# Patient Record
Sex: Male | Born: 1965 | Race: Black or African American | Hispanic: No | Marital: Married | Smoking: Former smoker
Health system: Southern US, Community
[De-identification: ages and names within clinical notes are randomized; demographics above are authoritative.]

## PROBLEM LIST (undated history)

## (undated) DIAGNOSIS — E785 Hyperlipidemia, unspecified: Secondary | ICD-10-CM

## (undated) DIAGNOSIS — Z91148 Patient's other noncompliance with medication regimen for other reason: Secondary | ICD-10-CM

## (undated) DIAGNOSIS — Z720 Tobacco use: Secondary | ICD-10-CM

## (undated) DIAGNOSIS — N182 Chronic kidney disease, stage 2 (mild): Secondary | ICD-10-CM

## (undated) DIAGNOSIS — I517 Cardiomegaly: Secondary | ICD-10-CM

## (undated) DIAGNOSIS — R9431 Abnormal electrocardiogram [ECG] [EKG]: Secondary | ICD-10-CM

## (undated) DIAGNOSIS — Z9114 Patient's other noncompliance with medication regimen: Secondary | ICD-10-CM

## (undated) DIAGNOSIS — F319 Bipolar disorder, unspecified: Secondary | ICD-10-CM

## (undated) DIAGNOSIS — I639 Cerebral infarction, unspecified: Secondary | ICD-10-CM

## (undated) DIAGNOSIS — I10 Essential (primary) hypertension: Secondary | ICD-10-CM

## (undated) DIAGNOSIS — Z8249 Family history of ischemic heart disease and other diseases of the circulatory system: Secondary | ICD-10-CM

## (undated) HISTORY — DX: Tobacco use: Z72.0

## (undated) HISTORY — DX: Patient's other noncompliance with medication regimen for other reason: Z91.148

## (undated) HISTORY — DX: Family history of ischemic heart disease and other diseases of the circulatory system: Z82.49

## (undated) HISTORY — DX: Hyperlipidemia, unspecified: E78.5

## (undated) HISTORY — DX: Abnormal electrocardiogram (ECG) (EKG): R94.31

## (undated) HISTORY — PX: ANKLE FRACTURE SURGERY: SHX122

## (undated) HISTORY — DX: Patient's other noncompliance with medication regimen: Z91.14

---

## 1997-07-02 ENCOUNTER — Emergency Department (HOSPITAL_COMMUNITY): Admission: EM | Admit: 1997-07-02 | Discharge: 1997-07-02 | Payer: Self-pay | Admitting: Emergency Medicine

## 1998-06-05 ENCOUNTER — Emergency Department (HOSPITAL_COMMUNITY): Admission: EM | Admit: 1998-06-05 | Discharge: 1998-06-05 | Payer: Self-pay | Admitting: Emergency Medicine

## 1999-06-27 ENCOUNTER — Encounter: Payer: Self-pay | Admitting: Emergency Medicine

## 1999-06-27 ENCOUNTER — Emergency Department (HOSPITAL_COMMUNITY): Admission: EM | Admit: 1999-06-27 | Discharge: 1999-06-27 | Payer: Self-pay | Admitting: Emergency Medicine

## 1999-08-16 ENCOUNTER — Encounter: Payer: Self-pay | Admitting: Emergency Medicine

## 1999-08-16 ENCOUNTER — Emergency Department (HOSPITAL_COMMUNITY): Admission: EM | Admit: 1999-08-16 | Discharge: 1999-08-16 | Payer: Self-pay | Admitting: Emergency Medicine

## 2000-09-08 ENCOUNTER — Emergency Department (HOSPITAL_COMMUNITY): Admission: EM | Admit: 2000-09-08 | Discharge: 2000-09-08 | Payer: Self-pay | Admitting: Emergency Medicine

## 2000-09-08 ENCOUNTER — Encounter: Payer: Self-pay | Admitting: Emergency Medicine

## 2001-01-06 ENCOUNTER — Emergency Department (HOSPITAL_COMMUNITY): Admission: EM | Admit: 2001-01-06 | Discharge: 2001-01-06 | Payer: Self-pay | Admitting: Emergency Medicine

## 2002-05-18 ENCOUNTER — Encounter: Payer: Self-pay | Admitting: Emergency Medicine

## 2002-05-18 ENCOUNTER — Emergency Department (HOSPITAL_COMMUNITY): Admission: EM | Admit: 2002-05-18 | Discharge: 2002-05-18 | Payer: Self-pay | Admitting: Cardiology

## 2002-09-01 ENCOUNTER — Emergency Department (HOSPITAL_COMMUNITY): Admission: EM | Admit: 2002-09-01 | Discharge: 2002-09-01 | Payer: Self-pay | Admitting: *Deleted

## 2003-11-22 ENCOUNTER — Emergency Department (HOSPITAL_COMMUNITY): Admission: EM | Admit: 2003-11-22 | Discharge: 2003-11-22 | Payer: Self-pay | Admitting: *Deleted

## 2004-06-01 ENCOUNTER — Emergency Department (HOSPITAL_COMMUNITY): Admission: EM | Admit: 2004-06-01 | Discharge: 2004-06-01 | Payer: Self-pay | Admitting: Family Medicine

## 2005-01-22 DIAGNOSIS — I10 Essential (primary) hypertension: Secondary | ICD-10-CM | POA: Insufficient documentation

## 2005-09-17 ENCOUNTER — Emergency Department (HOSPITAL_COMMUNITY): Admission: EM | Admit: 2005-09-17 | Discharge: 2005-09-17 | Payer: Self-pay | Admitting: Emergency Medicine

## 2005-12-25 ENCOUNTER — Emergency Department (HOSPITAL_COMMUNITY): Admission: EM | Admit: 2005-12-25 | Discharge: 2005-12-25 | Payer: Self-pay | Admitting: Emergency Medicine

## 2007-04-09 ENCOUNTER — Emergency Department (HOSPITAL_COMMUNITY): Admission: EM | Admit: 2007-04-09 | Discharge: 2007-04-09 | Payer: Self-pay | Admitting: Emergency Medicine

## 2007-04-15 ENCOUNTER — Emergency Department (HOSPITAL_COMMUNITY): Admission: EM | Admit: 2007-04-15 | Discharge: 2007-04-15 | Payer: Self-pay | Admitting: Emergency Medicine

## 2007-09-06 ENCOUNTER — Emergency Department (HOSPITAL_COMMUNITY): Admission: EM | Admit: 2007-09-06 | Discharge: 2007-09-06 | Payer: Self-pay | Admitting: Family Medicine

## 2007-09-23 ENCOUNTER — Emergency Department (HOSPITAL_COMMUNITY): Admission: EM | Admit: 2007-09-23 | Discharge: 2007-09-23 | Payer: Self-pay | Admitting: Emergency Medicine

## 2008-04-12 ENCOUNTER — Emergency Department (HOSPITAL_COMMUNITY): Admission: EM | Admit: 2008-04-12 | Discharge: 2008-04-12 | Payer: Self-pay | Admitting: Family Medicine

## 2008-04-19 ENCOUNTER — Ambulatory Visit: Payer: Self-pay | Admitting: Nurse Practitioner

## 2008-04-19 DIAGNOSIS — F17200 Nicotine dependence, unspecified, uncomplicated: Secondary | ICD-10-CM

## 2008-04-20 ENCOUNTER — Encounter (INDEPENDENT_AMBULATORY_CARE_PROVIDER_SITE_OTHER): Payer: Self-pay | Admitting: Nurse Practitioner

## 2008-05-03 ENCOUNTER — Ambulatory Visit: Payer: Self-pay | Admitting: Nurse Practitioner

## 2008-05-10 ENCOUNTER — Encounter (INDEPENDENT_AMBULATORY_CARE_PROVIDER_SITE_OTHER): Payer: Self-pay | Admitting: *Deleted

## 2008-05-10 ENCOUNTER — Ambulatory Visit: Payer: Self-pay | Admitting: Nurse Practitioner

## 2008-05-10 LAB — CONVERTED CEMR LAB: HDL goal, serum: 40 mg/dL

## 2008-07-20 ENCOUNTER — Emergency Department (HOSPITAL_COMMUNITY): Admission: EM | Admit: 2008-07-20 | Discharge: 2008-07-20 | Payer: Self-pay | Admitting: Family Medicine

## 2009-09-28 ENCOUNTER — Ambulatory Visit: Payer: Self-pay | Admitting: Internal Medicine

## 2009-09-28 ENCOUNTER — Encounter (INDEPENDENT_AMBULATORY_CARE_PROVIDER_SITE_OTHER): Payer: Self-pay | Admitting: Nurse Practitioner

## 2009-09-28 LAB — CONVERTED CEMR LAB
Blood in Urine, dipstick: NEGATIVE
GC Probe Amp, Genital: NEGATIVE
Nitrite: NEGATIVE
Specific Gravity, Urine: >=1.03
Urobilinogen, UA: 2

## 2009-10-03 ENCOUNTER — Encounter (INDEPENDENT_AMBULATORY_CARE_PROVIDER_SITE_OTHER): Payer: Self-pay | Admitting: Nurse Practitioner

## 2009-10-13 ENCOUNTER — Ambulatory Visit: Payer: Self-pay | Admitting: Nurse Practitioner

## 2009-10-13 LAB — CONVERTED CEMR LAB
CO2: 29 meq/L (ref 19–32)
Calcium: 9.7 mg/dL (ref 8.4–10.5)
Sodium: 142 meq/L (ref 135–145)

## 2009-10-14 ENCOUNTER — Encounter (INDEPENDENT_AMBULATORY_CARE_PROVIDER_SITE_OTHER): Payer: Self-pay | Admitting: Nurse Practitioner

## 2010-01-09 ENCOUNTER — Ambulatory Visit: Payer: Self-pay | Admitting: Nurse Practitioner

## 2010-02-02 ENCOUNTER — Telehealth (INDEPENDENT_AMBULATORY_CARE_PROVIDER_SITE_OTHER): Payer: Self-pay | Admitting: Nurse Practitioner

## 2010-02-19 LAB — CONVERTED CEMR LAB
ALT: 21 units/L (ref 0–53)
AST: 17 units/L (ref 0–37)
Alkaline Phosphatase: 73 units/L (ref 39–117)
Basophils Relative: 0 % (ref 0–1)
Blood Glucose, Fingerstick: 106
Blood in Urine, dipstick: NEGATIVE
Creatinine, Ser: 1.63 mg/dL — ABNORMAL HIGH (ref 0.40–1.50)
Eosinophils Absolute: 0.2 10*3/uL (ref 0.0–0.7)
Glucose, Urine, Semiquant: NEGATIVE
LDL Cholesterol: 106 mg/dL — ABNORMAL HIGH (ref 0–99)
MCHC: 35.2 g/dL (ref 30.0–36.0)
MCV: 84.5 fL (ref 78.0–100.0)
Neutro Abs: 1.8 10*3/uL (ref 1.7–7.7)
Neutrophils Relative %: 38 % — ABNORMAL LOW (ref 43–77)
Nitrite: NEGATIVE
PSA: 0.85 ng/mL (ref 0.10–4.00)
Platelets: 243 10*3/uL (ref 150–400)
Sodium: 140 meq/L (ref 135–145)
Specific Gravity, Urine: 1.025
TSH: 1.593 microintl units/mL (ref 0.350–4.500)
Total Bilirubin: 0.4 mg/dL (ref 0.3–1.2)
Total CHOL/HDL Ratio: 4.5
VLDL: 32 mg/dL (ref 0–40)
WBC: 4.7 10*3/uL (ref 4.0–10.5)

## 2010-02-23 NOTE — Letter (Signed)
Summary: *HSN Results Follow up  [facility]  [address] [place], [place] [address]   Phone: [phone]  Fax: [phone]      10/14/2009   Thomas [name] [address] [place], [place]  [address]   Dear  Thomas [name],                            ____S.Drinkard,FNP   ____D. Gore,FNP       ____B. McPherson,MD   ____V. Rankins,MD    ____E. Mulberry,MD    __X__N. Daphine Deutscher, FNP  ____D. Reche Dixon, MD    ____K. Philipp Deputy, MD    ____Other     This letter is to inform you that your recent test(s):  _______Pap Smear    ___X____Lab Test     _______X-ray    ____X___ is within acceptable limits  _______ requires a medication change  _______ requires a follow-up lab visit  _______ requires a follow-up visit with your provider   Comments: Labs done during recent office visit are normal.       _________________________________________________________ If you have any questions, please contact our office [phone].                    Sincerely,    Lehman Prom FNP [facility]

## 2010-02-23 NOTE — Assessment & Plan Note (Signed)
Summary: 3629 PT/BLADDER INFECTION//[place]   Vital Signs:  Patient profile:   45 year old male Height:      73.50 inches Weight:      208.5 pounds Temp:     97.7 degrees F oral Pulse rate:   68 / minute Pulse rhythm:   regular Resp:     20 per minute BP sitting:   182 / 136  (left arm) Cuff size:   regular  Vitals Entered By: Michelle Nasuti (September 28, 2009 9:45 AM) CC: C/O DISCOMFORT WITH URINATION DENIES PENILE DISCHARGE. PT REQUEST TO BE CHECKED (?STD SCREENING) Pain Assessment Patient in pain? yes      Intensity: 6  Does patient need assistance? Functional Status Self care Ambulation Normal   CC:  C/O DISCOMFORT WITH URINATION DENIES PENILE DISCHARGE. PT REQUEST TO BE CHECKED (?STD SCREENING).  History of Present Illness: 1.  Penile burning on urination:  Started couple days ago.  No discharge or odor.  Pt. is married.  Monogamous with wife.  States wife was checked a couple days ago and given unknown medication for an unknown infection.  He believes she is monogamous with him as well.  May have a bit of bilateral low back pain.  No fever.  Has had urinary frequency.  Has not had an STD in years--cannot recall what.  Later--pt. called wife.  She was treated for trichomonas and gonorrhea.  Discussed these are both STDs  2.  Hypertension:  Pt. not taking meds--states did not have money to follow up.    Habits & Providers  Alcohol-Tobacco-Diet     Tobacco Status: current     Cigarette Packs/Day: <0.25  Allergies (verified): No Known Drug Allergies  Social History: Packs/Day:  <0.25  Physical Exam  General:  NAD Abdomen:  No CVA tenderness.  Mild tenderness in bilateral sacral area.  soft, non-tender, and normal bowel sounds.   Rectal:  Difficult to tell if just pt's discomfort with rectal exam as immediately seemed to find this uncomfortable before any contact with prostate.  Prostate was without mass or definite bogginess, though exam limited by poor tolerance.   No rectal mass, heme negative light brown stool Genitalia:  Testes bilaterally descended without nodularity, tenderness or masses. No scrotal masses or lesions. No penis lesions or urethral discharge. No lesions beneath foreskin as well.  Did have some discomfort with palpation of distal penis near opening of urethra, though no obvious inflammatory changes there.   Impression & Recommendations:  Problem # 1:  DYSURIA (ICD-788.1)  New information that wife treated for gonorrhea and trichomonas. Will treat for both and send off labs below. Pt. unable to give a urine today--will bring back for evaluation later. Ceftriaxone 250 mg IM today for gonorrhea. Metronidazole for Trichomonas Orders: UA Dipstick w/o Micro (manual) (91478) Rocephin  250mg  (G9562) Admin of Therapeutic Inj  intramuscular or subcutaneous (13086) T- GC Chlamydia (57846)  His updated medication list for this problem includes:    Metronidazole 500 Mg Tabs (Metronidazole) .Marland KitchenMarland KitchenMarland KitchenMarland Kitchen 4 tabs by mouth for 1 dose.  Problem # 2:  HYPERTENSION, BENIGN ESSENTIAL (ICD-401.1) Discussed complications of untreated hypertension and need for treatment. Restart meds and have pt. follow up with primary His updated medication list for this problem includes:    Lisinopril-hydrochlorothiazide 20-12.5 Mg Tabs (Lisinopril-hydrochlorothiazide) .Marland Kitchen..Marland Kitchen Two tablets by mouth daily for blood pressure    Norvasc 10 Mg Tabs (Amlodipine besylate) .Marland Kitchen... 1 tablet by mouth daily for blood pressure  Orders: T- GC Chlamydia ([address])  Complete Medication List: 1)  Lisinopril-hydrochlorothiazide 20-12.5 Mg Tabs (Lisinopril-hydrochlorothiazide) .... Two tablets by mouth daily for blood pressure 2)  Ibuprofen 800 Mg Tabs (Ibuprofen) .Marland Kitchen.. 1 tablet by mouth every 8 hours as needed for pain 3)  Norvasc 10 Mg Tabs (Amlodipine besylate) .Marland Kitchen.. 1 tablet by mouth daily for blood pressure 4)  Metronidazole 500 Mg Tabs (Metronidazole) .... 4 tabs by mouth for 1  dose.  Patient Instructions: 1)  Nurse visit in 2 weeks for BMET (V58.69) and BP check--do not expect bp to be at goal then 2)  Follow up with Jesse Fall FNP  in 1 month for hypertension 3)  Please bring back urine sample later this morning. Prescriptions: METRONIDAZOLE 500 MG TABS (METRONIDAZOLE) 4 tabs by mouth for 1 dose.  #4 x 0   Entered and Authorized by:   Julieanne Manson MD   Signed by:   Julieanne Manson MD on 09/28/2009   Method used:   Electronically to        [facility] [address] [phone]* (retail)       [address]       [address]       [place], [place]  [address]       Ph: [phone]       Fax: [phone]   RxID:   [id number] NORVASC 10 MG TABS (AMLODIPINE BESYLATE) 1 tablet by mouth daily for blood pressure  #30 x 2   Entered and Authorized by:   Julieanne Manson MD   Signed by:   Julieanne Manson MD on 09/28/2009   Method used:   Electronically to        [facility] [address] [phone]* (retail)       [address]       [address]       [place], [place]  [address]       Ph: [phone]       Fax: [phone]   RxID:   [id number] LISINOPRIL-HYDROCHLOROTHIAZIDE 20-12.5 MG TABS (LISINOPRIL-HYDROCHLOROTHIAZIDE) Two tablets by mouth daily for blood pressure  #60 x 1   Entered and Authorized by:   Julieanne Manson MD   Signed by:   Julieanne Manson MD on 09/28/2009   Method used:   Electronically to        [facility] [address] [phone]* (retail)       [address]       [address]       [place], [place]  [address]       Ph: [phone]       Fax: [phone]   RxID:   [id number]    Medication Administration  Injection # 1:    Medication: Rocephin  250mg     Diagnosis: DYSURIA (ICD-788.1)    Route: IM    Site: RUOQ gluteus    Exp Date: 03/23/2011    Lot #: [id number]    Mfr: SANDOZ    Patient tolerated injection without complications    Given by: Michelle Nasuti (September 28, 2009 11:21 AM)  Orders Added: 1)  Est.  Patient Level IV [96789] 2)  UA Dipstick w/o Micro (manual) [81002] 3)  Rocephin  250mg  [J0696] 4)  Admin of Therapeutic Inj  intramuscular or subcutaneous [96372] 5)  T- GC Chlamydia [38101]  Laboratory Results   Urine Tests  Date/Time Received: September 29, 2009 2:47 PM   Routine Urinalysis   Color: brown Appearance: Hazy Glucose: negative   (Normal Range: Negative) Bilirubin: small   (  Normal Range: Negative) Ketone: trace (5)   (Normal Range: Negative) Spec. Gravity: >=1.030   (Normal Range: 1.003-1.035) Blood: negative   (Normal Range: Negative) pH: 5.5   (Normal Range: 5.0-8.0) Protein: trace   (Normal Range: Negative) Urobilinogen: 2.0   (Normal Range: 0-1) Nitrite: negative   (Normal Range: Negative) Leukocyte Esterace: small   (Normal Range: Negative)

## 2010-02-23 NOTE — Letter (Signed)
Summary: *HSN Results Follow up  [facility]  [address] [place], [place] [address]   Phone: [phone]  Fax: [phone]      10/03/2009   Thomas [name] [address] [place], [place]  [address]   Dear  [name],                            ____S.Drinkard,FNP   ____D. Gore,FNP       ____B. McPherson,MD   ____V. Rankins,MD    ____E. Mulberry,MD    _X___N. Daphine Deutscher, FNP  ____D. Reche Dixon, MD    ____K. Philipp Deputy, MD    ____Other     This letter is to inform you that your recent test(s):  _______Pap Smear    ___X____Lab Test     _______X-ray    ___X____ is within acceptable limits  _______ requires a medication change  _______ requires a follow-up lab visit  _______ requires a follow-up visit with your provider   Comments: Labs during recent office visit were normal.  Remember to keep your follow up appointments for blood pressure check and labs later this month.       _________________________________________________________ If you have any questions, please contact our office [phone].                    Sincerely,   Lehman Prom FNP [facility]

## 2010-02-23 NOTE — Progress Notes (Signed)
Summary: Lisinopril-HCTZ and Norvasc  Phone Note Refill Request   Refills Requested: Medication #1:  LISINOPRIL-HYDROCHLOROTHIAZIDE 20-12.5 MG TABS Two tablets by mouth daily for blood pressure  Medication #2:  NORVASC 10 MG TABS 1 tablet by mouth daily for blood pressure [facility]  Initial call taken by: [place] Shannon,  February 02, 2010 3:46 PM  Follow-up for Phone Call        Refills  completed.  Dutch Quint RN  February 02, 2010 4:02 PM     Prescriptions: NORVASC 10 MG TABS (AMLODIPINE BESYLATE) 1 tablet by mouth daily for blood pressure  #30 x 3   Entered by:   Dutch Quint RN   Authorized by:   Lehman Prom FNP   Signed by:   Dutch Quint RN on 02/02/2010   Method used:   Electronically to        [facility] [address] [phone]* (retail)       [address]       [address]       [place], [place]  [address]       Ph: [phone]       Fax: [phone]   RxID:   [id number] LISINOPRIL-HYDROCHLOROTHIAZIDE 20-12.5 MG TABS (LISINOPRIL-HYDROCHLOROTHIAZIDE) Two tablets by mouth daily for blood pressure  #60 x 3   Entered by:   Dutch Quint RN   Authorized by:   Lehman Prom FNP   Signed by:   Dutch Quint RN on 02/02/2010   Method used:   Electronically to        [facility] [address] [phone]* (retail)       [address]       [address]       [place], [place]  [address]       Ph: [phone]       Fax: [phone]   RxID:   [id number]

## 2011-06-23 ENCOUNTER — Emergency Department (INDEPENDENT_AMBULATORY_CARE_PROVIDER_SITE_OTHER): Payer: Medicaid Other

## 2011-06-23 ENCOUNTER — Emergency Department (INDEPENDENT_AMBULATORY_CARE_PROVIDER_SITE_OTHER)
Admission: EM | Admit: 2011-06-23 | Discharge: 2011-06-23 | Disposition: A | Discharge status: Home / Self Care | Payer: Medicaid Other | Source: Home / Self Care | Attending: Emergency Medicine | Admitting: Emergency Medicine

## 2011-06-23 ENCOUNTER — Encounter (HOSPITAL_COMMUNITY): Payer: Self-pay | Admitting: *Deleted

## 2011-06-23 DIAGNOSIS — S139XXA Sprain of joints and ligaments of unspecified parts of neck, initial encounter: Secondary | ICD-10-CM

## 2011-06-23 DIAGNOSIS — IMO0002 Reserved for concepts with insufficient information to code with codable children: Secondary | ICD-10-CM

## 2011-06-23 HISTORY — DX: Cardiomegaly: I51.7

## 2011-06-23 HISTORY — DX: Essential (primary) hypertension: I10

## 2011-06-23 MED ORDER — MELOXICAM 7.5 MG PO TABS: 7.5000 mg | ORAL_TABLET | Freq: Every day | ORAL | Status: DC

## 2011-06-23 MED ORDER — CYCLOBENZAPRINE HCL 10 MG PO TABS: 10.0000 mg | ORAL_TABLET | Freq: Three times a day (TID) | ORAL | Status: DC | PRN

## 2011-06-23 MED ORDER — CYCLOBENZAPRINE HCL 10 MG PO TABS: 10.0000 mg | ORAL_TABLET | Freq: Three times a day (TID) | ORAL | Status: AC | PRN

## 2011-06-23 NOTE — Discharge Instructions (Signed)
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
## 2011-06-28 HISTORY — PX: NM MYOCAR PERF WALL MOTION: HXRAD629

## 2011-06-28 HISTORY — PX: TRANSTHORACIC ECHOCARDIOGRAM: SHX275

## 2012-04-15 ENCOUNTER — Encounter (HOSPITAL_COMMUNITY): Payer: Self-pay | Admitting: Emergency Medicine

## 2012-04-15 ENCOUNTER — Emergency Department (HOSPITAL_COMMUNITY)
Admission: EM | Admit: 2012-04-15 | Discharge: 2012-04-16 | Disposition: A | Discharge status: Home / Self Care | Payer: Medicaid Other | Attending: Emergency Medicine | Admitting: Emergency Medicine

## 2012-04-15 ENCOUNTER — Emergency Department (HOSPITAL_COMMUNITY)
Admission: EM | Admit: 2012-04-15 | Discharge: 2012-04-15 | Disposition: A | Discharge status: Home / Self Care | Payer: Medicaid Other | Source: Home / Self Care | Attending: Emergency Medicine | Admitting: Emergency Medicine

## 2012-04-15 DIAGNOSIS — Z79899 Other long term (current) drug therapy: Secondary | ICD-10-CM | POA: Insufficient documentation

## 2012-04-15 DIAGNOSIS — K047 Periapical abscess without sinus: Secondary | ICD-10-CM | POA: Insufficient documentation

## 2012-04-15 DIAGNOSIS — F17200 Nicotine dependence, unspecified, uncomplicated: Secondary | ICD-10-CM | POA: Insufficient documentation

## 2012-04-15 DIAGNOSIS — I10 Essential (primary) hypertension: Secondary | ICD-10-CM | POA: Insufficient documentation

## 2012-04-15 DIAGNOSIS — Z8679 Personal history of other diseases of the circulatory system: Secondary | ICD-10-CM | POA: Insufficient documentation

## 2012-04-15 DIAGNOSIS — R61 Generalized hyperhidrosis: Secondary | ICD-10-CM | POA: Insufficient documentation

## 2012-04-15 DIAGNOSIS — K089 Disorder of teeth and supporting structures, unspecified: Secondary | ICD-10-CM | POA: Insufficient documentation

## 2012-04-15 DIAGNOSIS — K0889 Other specified disorders of teeth and supporting structures: Secondary | ICD-10-CM

## 2012-04-15 DIAGNOSIS — R509 Fever, unspecified: Secondary | ICD-10-CM | POA: Insufficient documentation

## 2012-04-15 LAB — CBC WITH DIFFERENTIAL/PLATELET
HCT: 44.8 % (ref 39.0–52.0)
Hemoglobin: 16.3 g/dL (ref 13.0–17.0)
Lymphocytes Relative: 18 % (ref 12–46)
MCHC: 36.4 g/dL — ABNORMAL HIGH (ref 30.0–36.0)
Monocytes Absolute: 0.7 10*3/uL (ref 0.1–1.0)
Monocytes Relative: 10 % (ref 3–12)
Neutro Abs: 4.9 10*3/uL (ref 1.7–7.7)

## 2012-04-15 LAB — POCT I-STAT, CHEM 8
BUN: 15 mg/dL (ref 6–23)
Calcium, Ion: 1.16 mmol/L (ref 1.12–1.23)
Chloride: 98 mEq/L (ref 96–112)
Creatinine, Ser: 1.7 mg/dL — ABNORMAL HIGH (ref 0.50–1.35)
Glucose, Bld: 100 mg/dL — ABNORMAL HIGH (ref 70–99)
HCT: 50 % (ref 39.0–52.0)
Hemoglobin: 17 g/dL (ref 13.0–17.0)
Potassium: 3.5 mEq/L (ref 3.5–5.1)
Sodium: 138 mEq/L (ref 135–145)
TCO2: 33 mmol/L (ref 0–100)

## 2012-04-15 MED ORDER — OXYCODONE-ACETAMINOPHEN 5-325 MG PO TABS
2.0000 | ORAL_TABLET | Freq: Once | ORAL | Status: AC
  Administered 2012-04-15: 2 via ORAL
  Filled 2012-04-15: qty 2

## 2012-04-15 MED ORDER — CLINDAMYCIN HCL 150 MG PO CAPS: 300.0000 mg | ORAL_CAPSULE | Freq: Three times a day (TID) | ORAL | Status: DC

## 2012-04-15 MED ORDER — SODIUM CHLORIDE 0.9 % IV BOLUS (SEPSIS)
1000.0000 mL | Freq: Once | INTRAVENOUS | Status: AC
Start: 2012-04-15 — End: 2012-04-16
  Administered 2012-04-16: 1000 mL via INTRAVENOUS

## 2012-04-15 MED ORDER — HYDROCODONE-ACETAMINOPHEN 5-325 MG PO TABS: 1.0000 | ORAL_TABLET | Freq: Four times a day (QID) | ORAL | Status: DC | PRN

## 2012-04-15 NOTE — ED Provider Notes (Signed)
History     CSN: [id number]  Arrival date & time 04/15/12  2058   First MD Initiated Contact with Patient 04/15/12 2227      Chief Complaint  Patient presents with  . Dental Pain   HPI  History provided by the patient and recent medical chart. Patient is a 47 year old male with history of hypertension who returns to the emergency room for worsening left upper dental pain and facial swelling. Patient was seen early this morning in the emergency room for similar symptoms. He was given prescriptions for Vicodin and clindamycin. He began taking clindamycin and throughout the day has had increased left facial swelling and pain. He also reports subjective fevers chills and sweats at home. He also use Vicodin as prescribed with mild improvement of pain. Patient is concerned for worsening infection. Patient states he was not given any dental followup and has not tried to followup within the dentist. Denies any other aggravating or alleviating factors. Denies any other associated symptoms. No difficulty swallowing or breathing. No nausea vomiting. No cough, chest pain or shortness of breath.    Past Medical History  Diagnosis Date  . Hypertension   . Cardiomegaly     History reviewed. No pertinent past surgical history.  No family history on file.  History  Substance Use Topics  . Smoking status: Current Every Day Smoker  . Smokeless tobacco: Not on file  . Alcohol Use: No      Review of Systems  Constitutional: Positive for fever, chills and diaphoresis.  HENT: Positive for dental problem. Negative for sore throat and trouble swallowing.   Gastrointestinal: Negative for nausea and vomiting.  All other systems reviewed and are negative.    Allergies  Review of patient's allergies indicates no known allergies.  Home Medications   Current Outpatient Rx  Name  Route  Sig  Dispense  Refill  . amLODipine (NORVASC) 10 MG tablet   Oral   Take 10 mg by mouth daily.         .  clindamycin (CLEOCIN) 150 MG capsule   Oral   Take 300 mg by mouth 3 (three) times daily.         Marland Kitchen HYDROcodone-acetaminophen (NORCO/VICODIN) 5-325 MG per tablet   Oral   Take 1 tablet by mouth every 6 (six) hours as needed for pain.         Marland Kitchen lisinopril-hydrochlorothiazide (PRINZIDE,ZESTORETIC) 20-12.5 MG per tablet   Oral   Take 2 tablets by mouth daily.           BP 168/125  Pulse 78  Temp(Src) 97.8 F (36.6 C) (Oral)  Resp 18  SpO2 96%  Physical Exam  Nursing note and vitals reviewed. Constitutional: He is oriented to person, place, and time. He appears well-developed and well-nourished. No distress.  HENT:  Head: Normocephalic and atraumatic.  Mouth/Throat:    Several dental caries throughout. There is significant dental caries and decay of left upper second molar. There is swelling to the lateral and adjacent gums with small amount of bleeding and purulent drainage. Area is tender to palpation. Swelling does extend into the face with slight loss of the left nasolabial fold. No significant periorbital edema.  Eyes: Conjunctivae and EOM are normal. Pupils are equal, round, and reactive to light.  Neck: Normal range of motion. Neck supple.  No meningeal signs  Cardiovascular: Normal rate and regular rhythm.   Pulmonary/Chest: Effort normal and breath sounds normal. No respiratory distress.  Abdominal: Soft.  Musculoskeletal:  Normal range of motion.  Neurological: He is alert and oriented to person, place, and time.  Skin: Skin is warm.  Psychiatric: He has a normal mood and affect. His behavior is normal.    ED Course  Procedures   Results for orders placed during the hospital encounter of 04/15/12  CBC WITH DIFFERENTIAL      Result Value Range   WBC 6.9  4.0 - 10.5 K/uL   RBC 5.39  4.22 - 5.81 MIL/uL   Hemoglobin 16.3  13.0 - 17.0 g/dL   HCT 40.9  81.1 - 91.4 %   MCV 83.1  78.0 - 100.0 fL   MCH 30.2  26.0 - 34.0 pg   MCHC 36.4 (*) 30.0 - 36.0 g/dL    RDW 78.2  95.6 - 21.3 %   Platelets 229  150 - 400 K/uL   Neutrophils Relative 71  43 - 77 %   Neutro Abs 4.9  1.7 - 7.7 K/uL   Lymphocytes Relative 18  12 - 46 %   Lymphs Abs 1.3  0.7 - 4.0 K/uL   Monocytes Relative 10  3 - 12 %   Monocytes Absolute 0.7  0.1 - 1.0 K/uL   Eosinophils Relative 1  0 - 5 %   Eosinophils Absolute 0.0  0.0 - 0.7 K/uL   Basophils Relative 0  0 - 1 %   Basophils Absolute 0.0  0.0 - 0.1 K/uL  POCT I-STAT, CHEM 8      Result Value Range   Sodium 138  135 - 145 mEq/L   Potassium 3.5  3.5 - 5.1 mEq/L   Chloride 98  96 - 112 mEq/L   BUN 15  6 - 23 mg/dL   Creatinine, Ser 0.86 (*) 0.50 - 1.35 mg/dL   Glucose, Bld 578 (*) 70 - 99 mg/dL   Calcium, Ion 4.69  6.29 - 1.23 mmol/L   TCO2 33  0 - 100 mmol/L   Hemoglobin 17.0  13.0 - 17.0 g/dL   HCT 52.8  41.3 - 24.4 %       1. Dental abscess       MDM  11:00 PM patient seen and evaluated. Patient resting appears well in no acute distress. Does not appear severely ill or toxic.  Will give patient IV fluids and check basic labs for baseline. May attempt further I&D of actively draining dental abscess.   Patient continues to have really good drainage of his dental abscess. Do not feel there is need for any widening of the opening at this time. Patient feels improved with IV fluids for hydration and pain medications. He may return home and continue his prescribed pain medications and antibiotic. Will provide dental referral.  Angus Seller, PA-C 04/16/12 0129

## 2012-04-15 NOTE — ED Notes (Signed)
Pt has swelling to rt cheek and 10/10 upper dental pain.  Pt was told he needed antibiotics prior to going to dentist to have teeth pulled. Pt has hx of hypertension and taking lisinopril andamlodipine.   Pt alert oriented X4

## 2012-04-15 NOTE — ED Notes (Addendum)
Pt stated that he thinks he has an "abscess on his tooth on the left side".  His face is swollen, experiencing chills.  He has high blood pressure and sees a cardiologist.  He wants [place] to check out everything. He states he's having difficulty eating.  He was seen here earlier today and prescribed antibiotic and pain medication.  He took those medications as prescribed but now his the left side of his face has swelled up again.  Mild swelling on left side of face (starting at upper cheek to left eye).

## 2012-04-15 NOTE — ED Provider Notes (Signed)
History     CSN: [id number]  Arrival date & time 04/15/12  6213   First MD Initiated Contact with Patient 04/15/12 [phone]      Chief Complaint  Patient presents with  . Abscess  . Dental Pain    (Consider location/radiation/quality/duration/timing/severity/associated sxs/prior treatment) HPI Comments: This is a 47 year old male, who presents emergency department with chief complaint of persistent dental pain. Patient states that he has had the pain for the past several days. He has not been able to see a dentist. He rates his pain is 10 out of 10. States that the pain as throbbing in nature. He denies fever, chills, nausea, and vomiting.  The history is provided by the patient. No language interpreter was used.    Past Medical History  Diagnosis Date  . Hypertension   . Cardiomegaly     History reviewed. No pertinent past surgical history.  History reviewed. No pertinent family history.  History  Substance Use Topics  . Smoking status: Current Every Day Smoker  . Smokeless tobacco: Not on file  . Alcohol Use: No      Review of Systems  All other systems reviewed and are negative.    Allergies  Review of patient's allergies indicates no known allergies.  Home Medications   Current Outpatient Rx  Name  Route  Sig  Dispense  Refill  . amLODipine (NORVASC) 10 MG tablet   Oral   Take 10 mg by mouth daily.         Marland Kitchen lisinopril-hydrochlorothiazide (PRINZIDE,ZESTORETIC) 20-12.5 MG per tablet   Oral   Take 2 tablets by mouth daily.         . clindamycin (CLEOCIN) 150 MG capsule   Oral   Take 2 capsules (300 mg total) by mouth 3 (three) times daily.   60 capsule   0   . HYDROcodone-acetaminophen (NORCO/VICODIN) 5-325 MG per tablet   Oral   Take 1 tablet by mouth every 6 (six) hours as needed for pain.   12 tablet   0     BP 183/140  Pulse 60  Temp(Src) 97.8 F (36.6 C) (Oral)  Resp 20  SpO2 98%  Physical Exam  Nursing note and vitals  reviewed. Constitutional: He is oriented to person, place, and time. He appears well-developed and well-nourished.  HENT:  Head: Normocephalic and atraumatic.  Poor dentition throughout, no signs of gingival abscesses, no signs of peritonsillar or tonsillar abscesses, oropharynx is clear, no exudates, no signs of Ludwig angina, airway is intact  Eyes: Conjunctivae and EOM are normal.  Neck: Normal range of motion.  Cardiovascular: Normal rate.   Pulmonary/Chest: Effort normal.  Abdominal: He exhibits no distension.  Musculoskeletal: Normal range of motion.  Neurological: He is alert and oriented to person, place, and time.  Skin: Skin is dry.  Psychiatric: He has a normal mood and affect. His behavior is normal. Judgment and thought content normal.    ED Course  Procedures (including critical care time)  Labs Reviewed - No data to display No results found.   1. Pain, dental       MDM  47 year old male with uncomplicated dental pain. Will treat with antibiotics, pain medicine, and dental followup.  Blood pressure is high, patient just took his blood pressure medicine. He denies having any dizziness, chest pain, blurred vision. Will have him followup with his primary care Dr. regarding the high blood pressure. He understands and agrees.      Roxy Horseman, PA-C 04/15/12 1521

## 2012-04-15 NOTE — ED Notes (Signed)
Pt sts left upper dental pain x several days with swelling; pt sts needs teeth pulled; pt noted to be htn and sts took meds but having pain

## 2012-04-15 NOTE — ED Notes (Signed)
PT. REPORTS PERSISTENT LEFT UPPER MOLAR PAIN , SEEN HERE TODAY PRESCRIBED WITH VICODIN AND CLINDAMYCIN .

## 2012-04-16 MED ORDER — ONDANSETRON HCL 4 MG/2ML IJ SOLN
4.0000 mg | Freq: Once | INTRAMUSCULAR | Status: AC
  Administered 2012-04-16: 4 mg via INTRAVENOUS
  Filled 2012-04-16: qty 2

## 2012-04-16 MED ORDER — MORPHINE SULFATE 4 MG/ML IJ SOLN
4.0000 mg | Freq: Once | INTRAMUSCULAR | Status: AC
  Administered 2012-04-16: 4 mg via INTRAVENOUS
  Filled 2012-04-16: qty 1

## 2012-04-16 NOTE — ED Provider Notes (Signed)
Medical screening examination/treatment/procedure(s) were performed by non-physician practitioner and as supervising physician I was immediately available for consultation/collaboration.   Joya Gaskins, MD 04/16/12 1501

## 2012-04-17 NOTE — ED Provider Notes (Signed)
Medical screening examination/treatment/procedure(s) were performed by non-physician practitioner and as supervising physician I was immediately available for consultation/collaboration.   Gavin Pound. Kevina Piloto, MD 04/17/12 1145

## 2012-06-09 ENCOUNTER — Ambulatory Visit (INDEPENDENT_AMBULATORY_CARE_PROVIDER_SITE_OTHER): Payer: Medicaid Other | Admitting: Pharmacist Clinician (PhC)/ Clinical Pharmacy Specialist

## 2012-06-09 ENCOUNTER — Encounter: Payer: Self-pay | Admitting: Pharmacist Clinician (PhC)/ Clinical Pharmacy Specialist

## 2012-06-09 VITALS — BP 112/76 | HR 60

## 2012-06-09 DIAGNOSIS — I10 Essential (primary) hypertension: Secondary | ICD-10-CM

## 2012-06-09 NOTE — Progress Notes (Signed)
  HPI: Pt is here for a follow up on his blood pressure.  In the past has had compliance problems, and when he did take meds, took all doses in the AM (rather than BID on hydralazine).  Was not adhering to diet, eating fast food and not watching sodium intake.  He was trying to play some basketball, but otherwise no exercise.  At last visit 1 month ago had a long discussion with patient about how to take meds (divided doses), increasing exercise and decreasing sodium.  Since then he states almost 100% compliance with all meds as well as time of day.  He has started walking 15-20 minutes approximately twice weekly, although he states not able to do much more due to fatigue.  States has decreased sodium, is not eating fast food.  Blood pressure at last visit here was 144/92 and 140/88, note that he had taken all 100mg  of hydralazine, lisinopril 40/25mg  and amlodipine 10mg  just a few hours before his appointment.  Vitals - 1 value per visit 06/09/2012 04/16/2012 04/15/2012 06/23/2011  SYSTOLIC 112 180 161 132  DIASTOLIC 76 113 140 76  PULSE 60 76 60 73    Current Outpatient Prescriptions  Medication Sig Dispense Refill  . aspirin EC 81 MG tablet Take 81 mg by mouth daily.      . hydrALAZINE (APRESOLINE) 100 MG tablet Take 100 mg by mouth 2 (two) times daily.      Marland Kitchen amLODipine (NORVASC) 10 MG tablet Take 10 mg by mouth daily.      . clindamycin (CLEOCIN) 150 MG capsule Take 300 mg by mouth 3 (three) times daily.      Marland Kitchen HYDROcodone-acetaminophen (NORCO/VICODIN) 5-325 MG per tablet Take 1 tablet by mouth every 6 (six) hours as needed for pain.      Marland Kitchen lisinopril-hydrochlorothiazide (PRINZIDE,ZESTORETIC) 20-12.5 MG per tablet Take 2 tablets by mouth daily.       No current facility-administered medications for this visit.    No Known Allergies  Past Medical History  Diagnosis Date  . Hypertension   . Cardiomegaly     BP 112/76  Pulse 60  ASSESSMENT AND PLAN: Mr. Leamer has made impressive  improvements in compliance with meds since last visit.  His BP is excellent today at 118/78 with a repeat reading of 112/76 and 122/80 (standing).  Praised his effort with decreasing sodium and increasing exercise.  Encouraged to move forward from twice weekly walking to 4-5 times per week.  Will continue with current meds as is, no changes at this time.  He is concerned about the appeals process for his disability claim, so I encouraged him to set up an appointment with Dr. Allyson Sabal, as it has been just a year since he was seen by an MD.

## 2012-06-09 NOTE — Patient Instructions (Signed)
Blood pressure readings look wonderful today.  Keep up the good work.  Continue meds as prescribed (lisinopril HCT and hydralazine each morning; amlodipine and hydralazine each evening)  Your physician recommends that you schedule a follow-up appointment iin 4-6 weeks with Dr.  Allyson Sabal

## 2012-06-26 ENCOUNTER — Encounter: Payer: Self-pay | Admitting: Pharmacist Clinician (PhC)/ Clinical Pharmacy Specialist

## 2012-07-07 ENCOUNTER — Ambulatory Visit (INDEPENDENT_AMBULATORY_CARE_PROVIDER_SITE_OTHER): Payer: Medicaid Other | Admitting: Physician Assistant

## 2012-07-07 ENCOUNTER — Telehealth: Payer: Self-pay | Admitting: Cardiology

## 2012-07-07 ENCOUNTER — Ambulatory Visit (INDEPENDENT_AMBULATORY_CARE_PROVIDER_SITE_OTHER): Payer: Medicaid Other | Admitting: Pharmacist Clinician (PhC)/ Clinical Pharmacy Specialist

## 2012-07-07 ENCOUNTER — Ambulatory Visit
Admission: RE | Admit: 2012-07-07 | Discharge: 2012-07-07 | Disposition: A | Discharge status: Home / Self Care | Payer: Medicaid Other | Source: Ambulatory Visit | Attending: Physician Assistant | Admitting: Physician Assistant

## 2012-07-07 ENCOUNTER — Ambulatory Visit: Payer: Medicaid Other | Admitting: Cardiovascular Disease

## 2012-07-07 ENCOUNTER — Encounter: Payer: Self-pay | Admitting: Physician Assistant

## 2012-07-07 VITALS — BP 120/70 | HR 93 | Ht 75.0 in | Wt 221.4 lb

## 2012-07-07 DIAGNOSIS — R0600 Dyspnea, unspecified: Secondary | ICD-10-CM

## 2012-07-07 DIAGNOSIS — I10 Essential (primary) hypertension: Secondary | ICD-10-CM

## 2012-07-07 DIAGNOSIS — R0989 Other specified symptoms and signs involving the circulatory and respiratory systems: Secondary | ICD-10-CM

## 2012-07-07 DIAGNOSIS — R079 Chest pain, unspecified: Secondary | ICD-10-CM

## 2012-07-07 DIAGNOSIS — R252 Cramp and spasm: Secondary | ICD-10-CM

## 2012-07-07 DIAGNOSIS — F17200 Nicotine dependence, unspecified, uncomplicated: Secondary | ICD-10-CM

## 2012-07-07 LAB — CBC WITH DIFFERENTIAL/PLATELET
Basophils Relative: 1 % (ref 0–1)
Eosinophils Absolute: 0.1 10*3/uL (ref 0.0–0.7)
HCT: 44.1 % (ref 39.0–52.0)
Hemoglobin: 15.1 g/dL (ref 13.0–17.0)
MCH: 29 pg (ref 26.0–34.0)
MCHC: 34.2 g/dL (ref 30.0–36.0)
Monocytes Absolute: 0.5 10*3/uL (ref 0.1–1.0)
Monocytes Relative: 12 % (ref 3–12)

## 2012-07-07 NOTE — Assessment & Plan Note (Signed)
Patient has decreased his tobacco use to one cigarette per day.

## 2012-07-07 NOTE — Progress Notes (Signed)
Date:  07/07/2012   ID:  Thomas [name], DOB 03-Jul-1965, MRN [id number]  PCP:  Dorrene German, MD  Primary Cardiologist:  Allyson Sabal     History of Present Illness: Thomas [name] is a 47 y.o. male history of hypertension, hyperlipidemia, tobacco abuse. Patient reports that he is down to one cigarette per day. His last 2-D echocardiogram was June of 2013 showed an ejection fraction greater than 55%. The proximal septal thickening. Underlying atrial enlargement on the left. Right ventricular systolic pressure was normal. Borderline Aortic root dilatation with no significant valvular disease. A treadmill Myoview also in June 2013 showed normal perfusion pattern all regions and EF 57% exercise capacity of 13 Mets. EKG showed inferior T-wave inversions with possible LVH and repolarization abnormalities.   Patient was seen back in the end of March of this year due to hypertension which is likely a result of being out of his blood pressure medications. He is now following up for blood pressure checks and was just seen by her pharmacist Phylis Bougie today for medication titration.  Due to symptoms of dyspnea and diaphoresis I asked to the patient today. Patient reports that on April 23 and then again on May 14 he was on a long bus trip first from [place] to [place] and then back from [place] and [place].  Each one was approximately 16 hours. Patient reports that his dyspnea and chest pain started about that time. He is also noted some lower extremity edema, camping in his lower extremities, lightheadedness, racing heart rate, and diaphoresis.  Does report subjective fever, and feeling weak.  The patient currently denies nausea, vomiting, orthopnea, dizziness, PND, cough, congestion, abdominal pain, hematochezia, melena.  He does report no sick contacts he recalls however, he was bus for 32 hours.    Wt Readings from Last 3 Encounters:  07/07/12 221 lb 6.4 oz (100.426 kg)  10/13/09 209  lb 6.4 oz (94.983 kg)  09/28/09 208 lb 8 oz (94.575 kg)     Past Medical History  Diagnosis Date  . Hypertension   . Cardiomegaly     Current Outpatient Prescriptions  Medication Sig Dispense Refill  . amLODipine (NORVASC) 10 MG tablet Take 10 mg by mouth daily.      Marland Kitchen aspirin EC 81 MG tablet Take 81 mg by mouth daily.      . clindamycin (CLEOCIN) 150 MG capsule Take 300 mg by mouth 3 (three) times daily.      . hydrALAZINE (APRESOLINE) 100 MG tablet Take 100 mg by mouth 2 (two) times daily.      Marland Kitchen HYDROcodone-acetaminophen (NORCO/VICODIN) 5-325 MG per tablet Take 1 tablet by mouth every 6 (six) hours as needed for pain.      Marland Kitchen lisinopril-hydrochlorothiazide (PRINZIDE,ZESTORETIC) 20-12.5 MG per tablet Take 2 tablets by mouth daily.       No current facility-administered medications for this visit.    Allergies:   No Known Allergies  Social History:  The patient  reports that he has been smoking Cigarettes.  He has been smoking about 0.25 packs per day. He does not have any smokeless tobacco history on file. He reports that he does not drink alcohol or use illicit drugs.   Family History  Problem Relation Age of Onset  . Hypertension Mother   . Hypertension Father   . Hypertension Sister   . Stroke Sister     ROS:  Please see the history of present illness.  All other systems reviewed  and negative.   PHYSICAL EXAM: VS:  BP 120/70  Pulse 93  Ht 6\' 3"  (1.905 m)  Wt 221 lb 6.4 oz (100.426 kg)  BMI 27.67 kg/m2  SpO2 97% Well nourished, well developed, appears tired or fatigued HEENT: Pupils are equal round react to light accommodation extraocular movements are intact.  Neck: no JVDNo cervical lymphadenopathy. Cardiac: Regular rate and rhythm with 1/6 systolic murmur Lungs:  clear to auscultation bilaterally, no wheezing, rhonchi or rale. No E. to A change. Ext: no lower extremity edema.  2+ radial and posterior tibial pulses. Skin: warm and dry Neuro:  Grossly normal.   3/5 equal upper extremity.  EKG:  EKG shows normal sinus rhythm with a rate of 92 beats per minute.  ASSESSMENT AND PLAN:  Problem List Items Addressed This Visit   Dyspnea     Patient's dyspnea, chest pain and tachycardia are concerning for a PE with possible respiratory  infection. I will order a d-dimer (stat), CBC with differential, and chest x-ray.  If d-dimer is positive, patient will need followup CT angiogram of the chest.      Relevant Orders      D-Dimer, Quantitative      CBC w/Diff      DG Chest 2 View   Essential hypertension     Patient was just seen by Phylis Bougie made adjustments to his blood pressure medication.     Leg cramps     Ordered basic metabolic panel to check electrolytes.    Relevant Orders      Basic Metabolic Panel (BMET)   TOBACCO ABUSE     Patient has decreased his tobacco use to one cigarette per day.     Other Visit Diagnoses   Chest pain    -  Primary    Relevant Orders       EKG 12-Lead

## 2012-07-07 NOTE — Assessment & Plan Note (Addendum)
Patient's dyspnea, chest pain and tachycardia are concerning for a PE with possible respiratory  infection. I will order a d-dimer (stat), CBC with differential, and chest x-ray.  If d-dimer is positive, patient will need followup CT angiogram of the chest.

## 2012-07-07 NOTE — Patient Instructions (Signed)
Have ordered the following tests: D-dimer, CBC, basic metabolic panel, chest x-ray.  If d-dimer is positive he'll be scheduled for CT angiogram of the chest.  She noticed worsening symptoms of shortness of breath, lower extremity edema and elevated heart rate go to the emergency room for followup.

## 2012-07-07 NOTE — Telephone Encounter (Signed)
Called by lab due to stat order. Negative D-dimer. Results faxed by lab to [facility].

## 2012-07-07 NOTE — Assessment & Plan Note (Signed)
Ordered basic metabolic panel to check electrolytes.

## 2012-07-07 NOTE — Assessment & Plan Note (Signed)
Patient was just seen by Phylis Bougie made adjustments to his blood pressure medication.

## 2012-07-08 ENCOUNTER — Telehealth: Payer: Self-pay | Admitting: Physician Assistant

## 2012-07-08 LAB — BASIC METABOLIC PANEL
BUN: 17 mg/dL (ref 6–23)
Calcium: 10 mg/dL (ref 8.4–10.5)
Glucose, Bld: 107 mg/dL — ABNORMAL HIGH (ref 70–99)
Potassium: 3.5 mEq/L (ref 3.5–5.3)

## 2012-07-08 NOTE — Telephone Encounter (Signed)
I spoke to the patient's wife, who was present during his clinic visit yesterday, and gave her the results of his lab tests and CXR.  I recommended follow up with PCP.  Katelyn Broadnax 2:35 PM.

## 2013-01-08 ENCOUNTER — Ambulatory Visit (INDEPENDENT_AMBULATORY_CARE_PROVIDER_SITE_OTHER): Payer: Medicaid Other | Admitting: Physician Assistant

## 2013-01-08 ENCOUNTER — Encounter: Payer: Self-pay | Admitting: Physician Assistant

## 2013-01-08 VITALS — BP 120/80 | HR 81 | Ht 75.0 in | Wt 214.0 lb

## 2013-01-08 DIAGNOSIS — F17200 Nicotine dependence, unspecified, uncomplicated: Secondary | ICD-10-CM

## 2013-01-08 DIAGNOSIS — R0789 Other chest pain: Secondary | ICD-10-CM

## 2013-01-08 DIAGNOSIS — I10 Essential (primary) hypertension: Secondary | ICD-10-CM

## 2013-01-08 NOTE — Patient Instructions (Signed)
1.  Labs tomorrow morning 2.  Follow up in 6 months with Dr. Allyson Sabal

## 2013-01-08 NOTE — Progress Notes (Signed)
Date:  01/08/2013   ID:  Thomas [name], DOB 11-29-1965, MRN [id number]  PCP:  Dorrene German, MD  Primary Cardiologist:  Allyson Sabal     History of Present Illness: Thomas [name] is a 47 y.o. male history of hypertension, hyperlipidemia, tobacco abuse. Patient reports that he is down to one cigarette per day. His last 2-D echocardiogram was June of 2013 showed an ejection fraction greater than 55%. The proximal septal thickening. Underlying atrial enlargement on the left. Right ventricular systolic pressure was normal. Borderline Aortic root dilatation with no significant valvular disease. A treadmill Myoview also in June 2013 showed normal perfusion pattern all regions and EF 57% exercise capacity of 13 Mets. EKG showed inferior T-wave inversions with possible LVH and repolarization abnormalities.   I last saw hi in June 2014 and at that time he was having dyspnea and LEE.  He had just been on a long bus trip.  D-dimer was negative.  He presents today for six month evaluation.  He reports an episode of sharp CP two days ago which lasted five minutes.  He has had none since.  He also complains of edema in his right thigh.  He reports he is in the process of trying to get full disability because of his heart.  The patient currently denies nausea, vomiting, fever, chest pain, shortness of breath, orthopnea, dizziness, PND, cough, congestion, abdominal pain, hematochezia, melena, lower extremity edema, claudication.  Wt Readings from Last 3 Encounters:  01/08/13 214 lb (97.07 kg)  07/07/12 221 lb 6.4 oz (100.426 kg)  10/13/09 209 lb 6.4 oz (94.983 kg)     Past Medical History  Diagnosis Date  . Hypertension   . Cardiomegaly     Current Outpatient Prescriptions  Medication Sig Dispense Refill  . amLODipine (NORVASC) 10 MG tablet Take 10 mg by mouth daily.      Marland Kitchen aspirin EC 81 MG tablet Take 81 mg by mouth daily.      Marland Kitchen buPROPion (WELLBUTRIN SR) 100 MG 12 hr tablet Take 100 mg by mouth  2 (two) times daily.      . hydrALAZINE (APRESOLINE) 100 MG tablet Take 100 mg by mouth 2 (two) times daily.      Marland Kitchen lisinopril-hydrochlorothiazide (PRINZIDE,ZESTORETIC) 20-12.5 MG per tablet Take 2 tablets by mouth daily.      . paliperidone (INVEGA) 3 MG 24 hr tablet Take 3 mg by mouth daily.       No current facility-administered medications for this visit.    Allergies:   No Known Allergies  Social History:  The patient  reports that he has been smoking Cigarettes.  He has been smoking about 0.25 packs per day. He does not have any smokeless tobacco history on file. He reports that he does not drink alcohol or use illicit drugs.   Family history:   Family History  Problem Relation Age of Onset  . Hypertension Mother   . Hypertension Father   . Hypertension Sister   . Stroke Sister     ROS:  Please see the history of present illness.  All other systems reviewed and negative.   PHYSICAL EXAM: VS:  BP 120/80  Pulse 81  Ht 6\' 3"  (1.905 m)  Wt 214 lb (97.07 kg)  BMI 26.75 kg/m2 Well nourished, well developed, in no acute distress HEENT: Pupils are equal round react to light accommodation extraocular movements are intact.  Neck: no JVDNo cervical lymphadenopathy. Cardiac: Regular rate and rhythm without murmurs rubs  or gallops. Lungs:  Right sided wheezing Abd: soft, nontender, positive bowel sounds all quadrants, no hepatosplenomegaly Ext: no lower extremity edema.  2+ radial and dorsalis pedis pulses. Skin: warm and dry Neuro:  Grossly normal  EKG:   NSR 81 bpm. LA enlargemnt   ASSESSMENT AND PLAN:  Problem List Items Addressed This Visit   TOBACCO ABUSE     Continues to smoke one cigarette a day.  We discussed tobacco sensation.    HYPERTENSION, BENIGN ESSENTIAL     BP well controlled.  The patient does not have a cardiac condition that would prevent him from working or require him to be on disability.      Chest pain, atypical     MS pain.  Hurt with palpation when  he had it.  It was up near the left clavicle.     Other Visit Diagnoses   HTN (hypertension)    -  Primary    Relevant Orders       EKG 12-Lead       Lipid Profile       Basic Metabolic Panel (BMET)

## 2013-01-08 NOTE — Assessment & Plan Note (Signed)
MS pain.  Hurt with palpation when he had it.  It was up near the left clavicle.

## 2013-01-08 NOTE — Assessment & Plan Note (Addendum)
BP well controlled.  The patient does not have a cardiac condition that would prevent him from working or require him to be on disability.

## 2013-01-08 NOTE — Assessment & Plan Note (Signed)
Continues to smoke one cigarette a day.  We discussed tobacco sensation.

## 2013-01-09 ENCOUNTER — Encounter: Payer: Self-pay | Admitting: Physician Assistant

## 2013-01-09 LAB — BASIC METABOLIC PANEL
Calcium: 10 mg/dL (ref 8.4–10.5)
Glucose, Bld: 89 mg/dL (ref 70–99)
Potassium: 3.8 mEq/L (ref 3.5–5.3)
Sodium: 141 mEq/L (ref 135–145)

## 2013-01-09 LAB — LIPID PANEL
HDL: 47 mg/dL (ref >39)
LDL Cholesterol: 163 mg/dL — ABNORMAL HIGH (ref 0–99)
Total CHOL/HDL Ratio: 4.8 Ratio
VLDL: 14 mg/dL (ref 0–40)

## 2013-01-23 ENCOUNTER — Emergency Department (HOSPITAL_COMMUNITY): Payer: Medicaid Other

## 2013-01-23 ENCOUNTER — Encounter (HOSPITAL_COMMUNITY): Payer: Self-pay | Admitting: Emergency Medicine

## 2013-01-23 ENCOUNTER — Emergency Department (HOSPITAL_COMMUNITY)
Admission: EM | Admit: 2013-01-23 | Discharge: 2013-01-23 | Disposition: A | Discharge status: Home / Self Care | Payer: Medicaid Other | Attending: Emergency Medicine | Admitting: Emergency Medicine

## 2013-01-23 DIAGNOSIS — Z7982 Long term (current) use of aspirin: Secondary | ICD-10-CM | POA: Insufficient documentation

## 2013-01-23 DIAGNOSIS — M7989 Other specified soft tissue disorders: Secondary | ICD-10-CM | POA: Insufficient documentation

## 2013-01-23 DIAGNOSIS — M25561 Pain in right knee: Secondary | ICD-10-CM

## 2013-01-23 DIAGNOSIS — I517 Cardiomegaly: Secondary | ICD-10-CM | POA: Insufficient documentation

## 2013-01-23 DIAGNOSIS — F17200 Nicotine dependence, unspecified, uncomplicated: Secondary | ICD-10-CM | POA: Insufficient documentation

## 2013-01-23 DIAGNOSIS — Z79899 Other long term (current) drug therapy: Secondary | ICD-10-CM | POA: Insufficient documentation

## 2013-01-23 DIAGNOSIS — I10 Essential (primary) hypertension: Secondary | ICD-10-CM | POA: Insufficient documentation

## 2013-01-23 DIAGNOSIS — M25569 Pain in unspecified knee: Secondary | ICD-10-CM | POA: Insufficient documentation

## 2013-01-23 DIAGNOSIS — R059 Cough, unspecified: Secondary | ICD-10-CM | POA: Insufficient documentation

## 2013-01-23 DIAGNOSIS — R05 Cough: Secondary | ICD-10-CM | POA: Insufficient documentation

## 2013-01-23 MED ORDER — NAPROXEN 500 MG PO TABS: 500.0000 mg | ORAL_TABLET | Freq: Two times a day (BID) | ORAL | Status: DC

## 2013-01-23 MED ORDER — HYDROCODONE-ACETAMINOPHEN 5-325 MG PO TABS: 1.0000 | ORAL_TABLET | Freq: Four times a day (QID) | ORAL | Status: DC | PRN

## 2013-01-23 NOTE — ED Notes (Signed)
Ortho tech at bedside 

## 2013-01-23 NOTE — ED Notes (Addendum)
Ortho paged, on the way 

## 2013-01-23 NOTE — ED Provider Notes (Signed)
CSN: [id number]     Arrival date & time 01/23/13  0709 History   First MD Initiated Contact with Patient 01/23/13 [phone]     Chief Complaint  Patient presents with  . Knee Pain   (Consider location/radiation/quality/duration/timing/severity/associated sxs/prior Treatment) Patient is a 48 y.o. male presenting with knee pain. The history is provided by the patient.  Knee Pain Associated symptoms: no fever    no injury to the knee. Patient with complaint of swelling and pain to the right knee swelling started yesterday morning but had some discomfort in the knee for a little while. No history of any problem with the knee in the past. Patient has had a persisting cough or breast for infection from several weeks ago. No fever no other complaints. No history of any old injury to the knee. Certainly no history of any acute injury to the knee.  Past Medical History  Diagnosis Date  . Hypertension   . Cardiomegaly    History reviewed. No pertinent past surgical history. Family History  Problem Relation Age of Onset  . Hypertension Mother   . Hypertension Father   . Hypertension Sister   . Stroke Sister    History  Substance Use Topics  . Smoking status: Current Every Day Smoker -- 0.25 packs/day for 15 years    Types: Cigarettes  . Smokeless tobacco: Not on file  . Alcohol Use: No     Comment: "off and on"    Review of Systems  Constitutional: Negative for fever.  HENT: Negative for congestion.   Eyes: Negative for redness.  Respiratory: Positive for cough. Negative for shortness of breath.   Cardiovascular: Negative for chest pain.  Gastrointestinal: Negative for abdominal pain.  Genitourinary: Negative for hematuria.  Musculoskeletal: Positive for joint swelling.  Skin: Negative for rash.  Neurological: Negative for headaches.  Hematological: Does not bruise/bleed easily.  Psychiatric/Behavioral: Negative for confusion.    Allergies  Review of patient's allergies indicates no  known allergies.  Home Medications   Current Outpatient Rx  Name  Route  Sig  Dispense  Refill  . amLODipine (NORVASC) 10 MG tablet   Oral   Take 10 mg by mouth daily.         Marland Kitchen. aspirin EC 81 MG tablet   Oral   Take 81 mg by mouth daily.         Marland Kitchen. buPROPion (WELLBUTRIN SR) 100 MG 12 hr tablet   Oral   Take 100 mg by mouth 2 (two) times daily.         . hydrALAZINE (APRESOLINE) 100 MG tablet   Oral   Take 100 mg by mouth 2 (two) times daily.         Marland Kitchen. lisinopril-hydrochlorothiazide (PRINZIDE,ZESTORETIC) 20-12.5 MG per tablet   Oral   Take 2 tablets by mouth daily.         . paliperidone (INVEGA) 3 MG 24 hr tablet   Oral   Take 3 mg by mouth daily.         Marland Kitchen. HYDROcodone-acetaminophen (NORCO/VICODIN) 5-325 MG per tablet   Oral   Take 1-2 tablets by mouth every 6 (six) hours as needed for moderate pain.   14 tablet   0   . naproxen (NAPROSYN) 500 MG tablet   Oral   Take 1 tablet (500 mg total) by mouth 2 (two) times daily.   14 tablet   0    BP 137/88  Pulse 72  Temp(Src) 98 F (36.7 C) (  Oral)  Resp 18  Ht 6\' 3"  (1.905 m)  Wt 215 lb (97.523 kg)  BMI 26.87 kg/m2  SpO2 98% Physical Exam  Nursing note and vitals reviewed. Constitutional: He is oriented to person, place, and time. He appears well-developed and well-nourished. No distress.  HENT:  Head: Normocephalic and atraumatic.  Mouth/Throat: Oropharynx is clear and moist.  Eyes: Conjunctivae and EOM are normal. Pupils are equal, round, and reactive to light.  Neck: Normal range of motion.  Cardiovascular: Normal rate, regular rhythm and normal heart sounds.   No murmur heard. Pulmonary/Chest: Effort normal and breath sounds normal. No respiratory distress.  Abdominal: Soft. Bowel sounds are normal. There is no tenderness.  Musculoskeletal: Normal range of motion. He exhibits edema. He exhibits no tenderness.  Swelling to the right knee patella is in place nontender limited range of motion due  to discomfort. Distally neurovascular intact. No calf swelling. Left knee normal.  Neurological: He is alert and oriented to person, place, and time. No cranial nerve deficit. He exhibits normal muscle tone. Coordination normal.  Skin: Skin is warm. No rash noted.    ED Course  Procedures (including critical care time) Labs Review Labs Reviewed - No data to display Imaging Review Dg Knee Complete 4 Views Right  01/23/2013   CLINICAL DATA:  Pain, decreased range of motion  EXAM: RIGHT KNEE - COMPLETE 4+ VIEW  COMPARISON:  04/12/2008  FINDINGS: There is no evidence of fracture, dislocation, or joint effusion. There is no evidence of arthropathy or other focal bone abnormality. Soft tissues are unremarkable.  IMPRESSION: Negative.   Electronically Signed   By: Salome Holmes M.D.   On: 01/23/2013 08:13    EKG Interpretation   None       MDM   1. Knee pain, acute, right    Patient with complaint of right-sided knee pain and swelling that started yesterday morning. Patient's had a persisting cough for several weeks clinically there is swelling to the right knee based on x-rays no joint effusion. Swelling must be outside the joint. X-ray showed no bony abnormalities. Will treat with knee immobilizer crutches anti-inflammatories some mild pain medicine and followup with orthopedics. The crutches per the patient's request. Work note also provided.    Shelda Jakes, MD 01/23/13 1031

## 2013-01-23 NOTE — Discharge Instructions (Signed)
Use crutches as needed. Take anti-inflammatory medicine on a regular basis. Take pain medicine as needed. Use knee immobilizer for your comfort. Make a plan to followup with orthopedics. X-ray of the knee showed no bony abnormalities and no fluid in the joint.

## 2013-01-23 NOTE — ED Notes (Signed)
Patient transported to X-ray 

## 2013-01-23 NOTE — ED Notes (Signed)
Right unilateral knee pain and swelling since yesterday morning, denies recent injury

## 2013-01-23 NOTE — ED Notes (Signed)
Patient resting, warm blanket given

## 2013-01-23 NOTE — Progress Notes (Signed)
Orthopedic Tech Progress Note Patient Details:  Thomas LibmanSteven L [name] 02/14/1965 [id number]  Ortho Devices Type of Ortho Device: Knee Immobilizer;Crutches Ortho Device/Splint Interventions: Application   Cammer, Mickie BailJennifer Carol 01/23/2013, 9:49 AM

## 2013-07-23 ENCOUNTER — Encounter: Payer: Self-pay | Admitting: *Deleted

## 2013-07-31 ENCOUNTER — Ambulatory Visit (INDEPENDENT_AMBULATORY_CARE_PROVIDER_SITE_OTHER): Payer: Medicaid Other | Admitting: Cardiovascular Disease

## 2013-07-31 ENCOUNTER — Encounter: Payer: Self-pay | Admitting: Cardiovascular Disease

## 2013-07-31 VITALS — BP 118/84 | HR 75 | Ht 75.0 in | Wt 204.9 lb

## 2013-07-31 DIAGNOSIS — I10 Essential (primary) hypertension: Secondary | ICD-10-CM

## 2013-07-31 NOTE — Assessment & Plan Note (Signed)
Patient has lost a significant amount of weight since he was here last. He has not taken his ACE inhibitor, diuretic or amlodipine. Blood pressure is well controlled today only on hydralazine. I told him that he can stop his other antihypertensive medications and we will follow him up in the office

## 2013-07-31 NOTE — Progress Notes (Signed)
07/31/2013 Thomas [name]   08-28-1965  [id number]  Primary Physician Thomas German, MD Primary Cardiologist: Thomas Gess MD Thomas [name]   HPI:  The patient is a reason-year-old, fit-appearing, married [place] American male, father of 5 children, grandfather to 3 grandchildren. 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 He is referred through the courtesy of Dr. Concepcion [name] for cardiovascular evaluation because of an abnormal EKG.   His cardiovascular risk factor profile is positive for long history of hypertension. He is not diabetic or hyperlipidemic that he knows. His father had MI in his 19s. He does smoke 4 to 5 cigarettes a day which he has been doing for the last 12 years. He does admit to drinking an excessive amount of alcohol up until 2 weeks ago. He does complain of dyspnea on exertion and exertional chest pain.   I last saw him 2 years ago. He has seen Thomas [name] [facility] several times since then. A 2-D echo performed 07/08/11 was normal as was a Myoview stress test. Unfortunately lost his father several months ago and has been depressed over this. He has lost a significant amount of weight which I think has contributed to his low blood pressure off of his medications which he ran out of month or so ago.    Current Outpatient Prescriptions  Medication Sig Dispense Refill  . aspirin EC 81 MG tablet Take 81 mg by mouth daily.      . hydrALAZINE (APRESOLINE) 100 MG tablet Take 100 mg by mouth 2 (two) times daily.      Marland Kitchen amLODipine (NORVASC) 10 MG tablet Take 10 mg by mouth daily.      Marland Kitchen buPROPion (WELLBUTRIN SR) 100 MG 12 hr tablet Take 100 mg by mouth 2 (two) times daily.      Marland Kitchen lisinopril-hydrochlorothiazide (PRINZIDE,ZESTORETIC) 20-12.5 MG per tablet Take 2 tablets by mouth daily.      . naproxen (NAPROSYN) 500 MG tablet Take 1 tablet (500 mg total) by mouth 2 (two) times daily.  14 tablet  0  . paliperidone  (INVEGA) 3 MG 24 hr tablet Take 3 mg by mouth daily.       No current facility-administered medications for this visit.    No Known Allergies  History   Social History  . Marital Status: Married    Spouse Name: N/A    Number of Children: N/A  . Years of Education: N/A   Occupational History  . Not on file.   Social History Main Topics  . Smoking status: Current Every Day Smoker -- 0.25 packs/day for 15 years    Types: Cigarettes  . Smokeless tobacco: Not on file  . Alcohol Use: No     Comment: "off and on"  . Drug Use: No  . Sexual Activity: Not on file   Other Topics Concern  . Not on file   Social History Narrative  . No narrative on file     Review of Systems: General: negative for chills, fever, night sweats or weight changes.  Cardiovascular: negative for chest pain, dyspnea on exertion, edema, orthopnea, palpitations, paroxysmal nocturnal dyspnea or shortness of breath Dermatological: negative for rash Respiratory: negative for cough or wheezing Urologic: negative for hematuria Abdominal: negative for nausea, vomiting, diarrhea, bright red blood per rectum, melena, or hematemesis Neurologic: negative for visual changes, syncope, or dizziness All other systems reviewed and are otherwise negative except as noted above.  Blood pressure 118/84, pulse 75, height 6\' 3"  (1.905 m), weight 204 lb 14.4 oz (92.942 kg).  General appearance: alert and no distress Neck: no adenopathy, no carotid bruit, no JVD, supple, symmetrical, trachea midline and thyroid not enlarged, symmetric, no tenderness/mass/nodules Lungs: clear to auscultation bilaterally Heart: regular rate and rhythm, S1, S2 normal, no murmur, click, rub or gallop Extremities: extremities normal, atraumatic, no cyanosis or edema ormal sinus rhythm at 75 with evidence of EKG left ventricular hypertrophy with repolarization changes .  ASSESSMENT AND PLAN:   HYPERTENSION, BENIGN ESSENTIAL Patient has lost  a significant amount of weight since he was here last. He has not taken his ACE inhibitor, diuretic or amlodipine. Blood pressure is well controlled today only on hydralazine. I told him that he can stop his other antihypertensive medications and we will follow him up in the office      Thomas GessJonathan J. Lenford Beddow MD [facility] ArizonaFACP,FACC,FAHA, [facility] WestFSCAI 07/31/2013 2:29 PM

## 2013-07-31 NOTE — Patient Instructions (Signed)
We request that you follow-up in: 1 month with Belenda CruiseKristin, our pharmacist for a blood pressure check                                                        6 months with an extender Judie Grieve(Bryan)                                                        and in 1 year  with Dr San MorelleBerry  You will receive a reminder letter in the mail two months in advance. If you don't receive a letter, please call our office to schedule the follow-up appointment.

## 2013-08-14 ENCOUNTER — Emergency Department (HOSPITAL_COMMUNITY)
Admission: EM | Admit: 2013-08-14 | Discharge: 2013-08-15 | Disposition: A | Discharge status: Home / Self Care | Payer: Medicaid Other | Attending: Emergency Medicine | Admitting: Emergency Medicine

## 2013-08-14 ENCOUNTER — Emergency Department (HOSPITAL_COMMUNITY): Payer: Medicaid Other

## 2013-08-14 ENCOUNTER — Encounter (HOSPITAL_COMMUNITY): Payer: Self-pay | Admitting: Emergency Medicine

## 2013-08-14 DIAGNOSIS — R079 Chest pain, unspecified: Secondary | ICD-10-CM

## 2013-08-14 DIAGNOSIS — Z7982 Long term (current) use of aspirin: Secondary | ICD-10-CM | POA: Diagnosis not present

## 2013-08-14 DIAGNOSIS — I10 Essential (primary) hypertension: Secondary | ICD-10-CM | POA: Diagnosis not present

## 2013-08-14 DIAGNOSIS — F17200 Nicotine dependence, unspecified, uncomplicated: Secondary | ICD-10-CM | POA: Insufficient documentation

## 2013-08-14 DIAGNOSIS — Z79899 Other long term (current) drug therapy: Secondary | ICD-10-CM | POA: Insufficient documentation

## 2013-08-14 LAB — BASIC METABOLIC PANEL
Anion gap: 16 — ABNORMAL HIGH (ref 5–15)
BUN: 15 mg/dL (ref 6–23)
CALCIUM: 9.2 mg/dL (ref 8.4–10.5)
CO2: 21 mEq/L (ref 19–32)
Chloride: 102 mEq/L (ref 96–112)
Creatinine, Ser: 1.38 mg/dL — ABNORMAL HIGH (ref 0.50–1.35)
GFR, EST AFRICAN AMERICAN: 69 mL/min — AB (ref >90)
GFR, EST NON AFRICAN AMERICAN: 60 mL/min — AB (ref >90)
Glucose, Bld: 101 mg/dL — ABNORMAL HIGH (ref 70–99)
POTASSIUM: 3.3 meq/L — AB (ref 3.7–5.3)
Sodium: 139 mEq/L (ref 137–147)

## 2013-08-14 LAB — CBC WITH DIFFERENTIAL/PLATELET
BASOS PCT: 0 % (ref 0–1)
Basophils Absolute: 0 10*3/uL (ref 0.0–0.1)
EOS ABS: 0.1 10*3/uL (ref 0.0–0.7)
EOS PCT: 1 % (ref 0–5)
HCT: 41.7 % (ref 39.0–52.0)
Hemoglobin: 14.9 g/dL (ref 13.0–17.0)
Lymphocytes Relative: 31 % (ref 12–46)
Lymphs Abs: 2.3 10*3/uL (ref 0.7–4.0)
MCH: 30.4 pg (ref 26.0–34.0)
MCHC: 35.7 g/dL (ref 30.0–36.0)
MCV: 85.1 fL (ref 78.0–100.0)
MONOS PCT: 12 % (ref 3–12)
Monocytes Absolute: 0.9 10*3/uL (ref 0.1–1.0)
NEUTROS PCT: 56 % (ref 43–77)
Neutro Abs: 4.2 10*3/uL (ref 1.7–7.7)
PLATELETS: 214 10*3/uL (ref 150–400)
RBC: 4.9 MIL/uL (ref 4.22–5.81)
RDW: 13.9 % (ref 11.5–15.5)
WBC: 7.5 10*3/uL (ref 4.0–10.5)

## 2013-08-14 MED ORDER — MORPHINE SULFATE 4 MG/ML IJ SOLN
4.0000 mg | Freq: Once | INTRAMUSCULAR | Status: AC
  Administered 2013-08-14: 4 mg via INTRAVENOUS
  Filled 2013-08-14: qty 1

## 2013-08-14 NOTE — ED Provider Notes (Signed)
CSN: [id number]     Arrival date & time 08/14/13  2247 History   First MD Initiated Contact with Patient 08/14/13 2249     No chief complaint on file.    (Consider location/radiation/quality/duration/timing/severity/associated sxs/prior Treatment) HPI Comments: Patient presents emergency department with chief complaint of chest pain. He states pain started about 3 days ago. He states it recently worsened this evening. States the pain is located in the right side of his chest. It is worsened with deep breathing, and with palpation. He states the pain is 9/10. He has taken aspirin, and nitroglycerin with some relief. He has a cardiologist, but would like a new one because he said that his cardiologist doesn't think there's anything wrong with his heart. He denies any specific heart problems with the exception of cardiomegaly and hypertension. He denies any history of blood clots. He states that it just feels like there is something wrong with his heart. He denies any fevers, nausea, vomiting, diarrhea, constipation. Denies any cough. States that he has had some chills, and fatigue for the past couple of days.  The history is provided by the patient. No language interpreter was used.    Past Medical History  Diagnosis Date  . Hypertension   . Cardiomegaly   . Abnormal EKG   . Family history of heart disease   . H/O medication noncompliance   . Tobacco abuse    Past Surgical History  Procedure Laterality Date  . Transthoracic echocardiogram  06/28/2011    EF=55%, Proximal septal thickening, borderline LA enlargement, boarderline aortic root dialation  . Nm myocar perf wall motion  06/28/2011    protocol Bruce, normal perfusion nin all regions, post stress EF 57%,, exercise cap   Family History  Problem Relation Age of Onset  . Hypertension Mother   . Diabetes Mother   . Stroke Mother   . Hypertension Father   . Diabetes Father   . Stroke Father   . Hypertension Sister   .  Stroke Sister   . Diabetes Sister   . Hyperlipidemia Maternal Grandmother   . Diabetes Maternal Grandmother   . Asthma Daughter   . Asthma Son    History  Substance Use Topics  . Smoking status: Current Every Day Smoker -- 0.25 packs/day for 15 years    Types: Cigarettes  . Smokeless tobacco: Not on file  . Alcohol Use: No     Comment: "off and on"    Review of Systems  All other systems reviewed and are negative.     Allergies  Review of patient's allergies indicates no known allergies.  Home Medications   Prior to Admission medications   Medication Sig Start Date End Date Taking? Authorizing Provider  amLODipine (NORVASC) 10 MG tablet Take 10 mg by mouth daily.    Historical Provider, MD  aspirin EC 81 MG tablet Take 81 mg by mouth daily.    Historical Provider, MD  buPROPion (WELLBUTRIN SR) 100 MG 12 hr tablet Take 100 mg by mouth 2 (two) times daily.    Historical Provider, MD  hydrALAZINE (APRESOLINE) 100 MG tablet Take 100 mg by mouth 2 (two) times daily.    Historical Provider, MD  lisinopril-hydrochlorothiazide (PRINZIDE,ZESTORETIC) 20-12.5 MG per tablet Take 2 tablets by mouth daily.    Historical Provider, MD  naproxen (NAPROSYN) 500 MG tablet Take 1 tablet (500 mg total) by mouth 2 (two) times daily. 01/23/13   Vanetta Mulders, MD  paliperidone (INVEGA) 3 MG 24 hr tablet  Take 3 mg by mouth daily.    Historical Provider, MD   There were no vitals taken for this visit. Physical Exam  Nursing note and vitals reviewed. Constitutional: He is oriented to person, place, and time. He appears well-developed and well-nourished.  HENT:  Head: Normocephalic and atraumatic.  Eyes: Conjunctivae and EOM are normal. Pupils are equal, round, and reactive to light. Right eye exhibits no discharge. Left eye exhibits no discharge. No scleral icterus.  Neck: Normal range of motion. Neck supple. No JVD present.  Cardiovascular: Normal rate, regular rhythm and normal heart sounds.   Exam reveals no gallop and no friction rub.   No murmur heard. Pulmonary/Chest: Effort normal and breath sounds normal. No respiratory distress. He has no wheezes. He has no rales. He exhibits no tenderness.  Clear to auscultation bilateral  Abdominal: Soft. He exhibits no distension and no mass. There is no tenderness. There is no rebound and no guarding.  Musculoskeletal: Normal range of motion. He exhibits no edema and no tenderness.  Neurological: He is alert and oriented to person, place, and time.  Skin: Skin is warm and dry.  Psychiatric: He has a normal mood and affect. His behavior is normal. Judgment and thought content normal.    ED Course  Procedures (including critical care time) Results for orders placed during the hospital encounter of 08/14/13  CBC WITH DIFFERENTIAL      Result Value Ref Range   WBC 7.5  4.0 - 10.5 K/uL   RBC 4.90  4.22 - 5.81 MIL/uL   Hemoglobin 14.9  13.0 - 17.0 g/dL   HCT 54.0  98.1 - 19.1 %   MCV 85.1  78.0 - 100.0 fL   MCH 30.4  26.0 - 34.0 pg   MCHC 35.7  30.0 - 36.0 g/dL   RDW 47.8  29.5 - 62.1 %   Platelets 214  150 - 400 K/uL   Neutrophils Relative % 56  43 - 77 %   Neutro Abs 4.2  1.7 - 7.7 K/uL   Lymphocytes Relative 31  12 - 46 %   Lymphs Abs 2.3  0.7 - 4.0 K/uL   Monocytes Relative 12  3 - 12 %   Monocytes Absolute 0.9  0.1 - 1.0 K/uL   Eosinophils Relative 1  0 - 5 %   Eosinophils Absolute 0.1  0.0 - 0.7 K/uL   Basophils Relative 0  0 - 1 %   Basophils Absolute 0.0  0.0 - 0.1 K/uL  BASIC METABOLIC PANEL      Result Value Ref Range   Sodium 139  137 - 147 mEq/L   Potassium 3.3 (*) 3.7 - 5.3 mEq/L   Chloride 102  96 - 112 mEq/L   CO2 21  19 - 32 mEq/L   Glucose, Bld 101 (*) 70 - 99 mg/dL   BUN 15  6 - 23 mg/dL   Creatinine, Ser 3.08 (*) 0.50 - 1.35 mg/dL   Calcium 9.2  8.4 - 65.7 mg/dL   GFR calc non Af Amer 60 (*) >90 mL/min   GFR calc Af Amer 69 (*) >90 mL/min   Anion gap 16 (*) 5 - 15  I-STAT TROPOININ, ED      Result  Value Ref Range   Troponin i, poc 0.02  0.00 - 0.08 ng/mL   Comment 3            Dg Chest Portable 1 View  08/15/2013   CLINICAL DATA:  Chest pain  EXAM: PORTABLE CHEST - 1 VIEW  COMPARISON:  Prior radiograph from 07/07/2012  FINDINGS: The cardiac and mediastinal silhouettes are stable in size and contour, and remain within normal limits.  The lungs are normally inflated. No airspace consolidation, pleural effusion, or pulmonary edema is identified. There is no pneumothorax.  No acute osseous abnormality identified.  IMPRESSION: No acute cardiopulmonary abnormality.   Electronically Signed   By: Rise MuBenjamin  McClintock M.D.   On: 08/15/2013 00:40      EKG Interpretation   Date/Time:  Friday August 14 2013 22:58:31 EDT Ventricular Rate:  91 PR Interval:  188 QRS Duration: 97 QT Interval:  365 QTC Calculation: 449 R Axis:   -51 Text Interpretation:  Sinus rhythm LAD, consider left anterior fascicular  block Left ventricular hypertrophy Anterior ST elevation, probably due to  LVH No significant change since last tracing 07/31/13 Confirmed by HORTON   MD, COURTNEY ([id number]) on 08/15/2013 12:53:41 AM      MDM   Final diagnoses:  Chest pain, unspecified chest pain type    Patient with chest pain times several days, and some weakness. Will check basic labs, will reassess.  Labs are reassuring. Troponin is negative. This did not cross over in EPIC.  HEART score is 2, low risk.  Anticipate discharge to home. PERC negative.  Chest pain is reproducible with palpation.  Patient discussed with Dr. Wilkie AyeHorton, who agrees with the plan. Discharged to home with cardiology followup. Patient understands and agrees to plan. He is stable and ready for discharge.   Roxy Horsemanobert Welborn Keena, PA-C 08/15/13 [phone]

## 2013-08-14 NOTE — ED Notes (Signed)
Pt reports cp that started around 7pm, weakness for 3 days, chills, and just not feeling well. Pt reports pain increases when being pressed and taking deep breaths. Pt rates pain 9/10. Pt was given 324 ASA, 1 nitro and reports pain is 8/10. Vitals 133/84, 100 HR, 16 resp, 98% RA. Pt has hx of HTN.

## 2013-08-15 LAB — I-STAT TROPONIN, ED: Troponin i, poc: 0.02 ng/mL (ref 0.00–0.08)

## 2013-08-15 MED ORDER — HYDROCODONE-ACETAMINOPHEN 5-325 MG PO TABS: 1.0000 | ORAL_TABLET | Freq: Four times a day (QID) | ORAL | Status: DC | PRN

## 2013-08-15 MED ORDER — IBUPROFEN 800 MG PO TABS: 800.0000 mg | ORAL_TABLET | Freq: Three times a day (TID) | ORAL | Status: DC

## 2013-08-15 NOTE — ED Notes (Signed)
Discharge and follow up instructions reviewed with pt. Pt verbalized understanding.  

## 2013-08-15 NOTE — ED Provider Notes (Signed)
Medical screening examination/treatment/procedure(s) were performed by non-physician practitioner and as supervising physician I was immediately available for consultation/collaboration.   EKG Interpretation   Date/Time:  Friday August 14 2013 22:58:31 EDT Ventricular Rate:  91 PR Interval:  188 QRS Duration: 97 QT Interval:  365 QTC Calculation: 449 R Axis:   -51 Text Interpretation:  Sinus rhythm LAD, consider left anterior fascicular  block Left ventricular hypertrophy Anterior ST elevation, probably due to  LVH No significant change since last tracing 07/31/13 Confirmed by Wilkie AyeHORTON   MD, COURTNEY ([id number]) on 08/15/2013 12:53:41 AM        Shon Batonourtney F Horton, MD 08/15/13 1001

## 2013-08-15 NOTE — Discharge Instructions (Signed)

## 2013-08-19 ENCOUNTER — Telehealth: Payer: Self-pay | Admitting: Cardiovascular Disease

## 2013-08-19 NOTE — Telephone Encounter (Signed)
Spoke with pt, he just wanted to know his follow up appt. appt date and time given. He did not want to discuss his symptoms, states I only want to know my follow up appt time.

## 2013-08-19 NOTE — Telephone Encounter (Signed)
New message  Pt called still feeling CHEST PAIN,. And SOB off and on// Request a call back to discuss. Made appt with PA on 08/27/2013 @ 4 pm. Please call

## 2013-08-27 ENCOUNTER — Ambulatory Visit (INDEPENDENT_AMBULATORY_CARE_PROVIDER_SITE_OTHER): Payer: Medicaid Other | Admitting: Cardiology

## 2013-08-27 ENCOUNTER — Encounter: Payer: Self-pay | Admitting: Cardiology

## 2013-08-27 VITALS — BP 155/98 | HR 58 | Ht 75.0 in | Wt 204.7 lb

## 2013-08-27 DIAGNOSIS — R079 Chest pain, unspecified: Secondary | ICD-10-CM

## 2013-08-27 MED ORDER — LISINOPRIL-HYDROCHLOROTHIAZIDE 20-12.5 MG PO TABS: 2.0000 | ORAL_TABLET | Freq: Every day | ORAL | Status: DC

## 2013-08-27 MED ORDER — AMLODIPINE BESYLATE 10 MG PO TABS: 10.0000 mg | ORAL_TABLET | Freq: Every day | ORAL | Status: DC

## 2013-08-27 NOTE — Progress Notes (Signed)
Patient ID: Thomas [name], male   DOB: 11-07-1965, 48 y.o.   MRN: [id number]    08/27/2013 Thomas [name]   09-Oct-1965  [id number]  Primary Physicia Dorrene German, MD Primary Cardiologist: Dr. Allyson Sabal  HPI:  The patient is a 48 year old African American male, followed by Dr. Allyson Sabal, who presents to clinic today after recently being seen in the emergency department for evaluation of chest pain. He has multiple cardiac risk factors including a strong family history of CAD noting myocardial infarction in his father in his early 21s, as well as a personal history of hypertension and tobacco abuse. He reports a 10+ history of tobacco abuse but recently quit smoking 3 weeks ago. He had a nuclear stress test in 2013 that was low risk for ischemia and also had a 2-D echocardiogram which revealed normal LV function.  He was seen in the [facility] Emergency Department on 08/14/2013 for evaluation of atypical chest pain. Based on the ED provider note, it appears that he did have some chest pain that was reproducible with palpation. His EKG was nonischemic and his troponin level was normal. He was discharge from the emergency department and was instructed to followup with [place] for further evaluation.   Today he reports recent history of intermittent, atypical chest discomfort. He notes nocturnal, substernal to right-sided chest discomfort, described as a some times sharp/pressure like sensation. It does not radiate. It is also not worse with exertion. It is nonpleuritic. However, it is often associated with mild dyspnea, diaphoresis, nausea and vomiting. His symptoms are often self-limited and last for 5-10 minutes. He denies any history of acid reflux and symptoms are not related to heavy meals. He has not tried any medications for symptoms. On physical exam today, there is no tenderness with palpation of the chest wall.  Also of note, he states that he recently ran out of his blood pressure medications 2 days ago  including his amlodipine and his hydrochlorothiazide/lisinopril. Blood pressure today in clinic is 155/98.  Current Outpatient Prescriptions  Medication Sig Dispense Refill  . amLODipine (NORVASC) 10 MG tablet Take 10 mg by mouth daily.      . hydrALAZINE (APRESOLINE) 100 MG tablet Take 100 mg by mouth 2 (two) times daily.      Marland Kitchen HYDROcodone-acetaminophen (NORCO/VICODIN) 5-325 MG per tablet Take 1 tablet by mouth every 6 (six) hours as needed.  10 tablet  0  . ibuprofen (ADVIL,MOTRIN) 800 MG tablet Take 1 tablet (800 mg total) by mouth 3 (three) times daily.  21 tablet  0  . lisinopril-hydrochlorothiazide (PRINZIDE,ZESTORETIC) 20-12.5 MG per tablet Take 2 tablets by mouth daily.      Marland Kitchen OVER THE COUNTER MEDICATION Take 1 tablet by mouth daily. Pure life energy buster       No current facility-administered medications for this visit.    No Known Allergies  History   Social History  . Marital Status: Married    Spouse Name: N/A    Number of Children: N/A  . Years of Education: N/A   Occupational History  . Not on file.   Social History Main Topics  . Smoking status: Former Smoker -- 0.25 packs/day for 15 years    Types: Cigarettes    Quit date: 08/06/2013  . Smokeless tobacco: Not on file     Comment: quit in Juy 2015  . Alcohol Use: No     Comment: "off and on" - stopped 4 weeks ago (as of 48)  . Drug Use:  No  . Sexual Activity: Not on file   Other Topics Concern  . Not on file   Social History Narrative  . No narrative on file     Review of Systems: General: negative for chills, fever, night sweats or weight changes.  Cardiovascular: negative for chest pain, dyspnea on exertion, edema, orthopnea, palpitations, paroxysmal nocturnal dyspnea or shortness of breath Dermatological: negative for rash Respiratory: negative for cough or wheezing Urologic: negative for hematuria Abdominal: negative for nausea, vomiting, diarrhea, bright red blood per rectum, melena, or  hematemesis Neurologic: negative for visual changes, syncope, or dizziness All other systems reviewed and are otherwise negative except as noted above.    Blood pressure 155/98, pulse 58, height 6\' 3"  (1.905 m), weight 204 lb 11.2 oz (92.851 kg).  General appearance: alert, cooperative and no distress Neck: no carotid bruit and no JVD Lungs: clear to auscultation bilaterally Heart: regular rate and rhythm, S1, S2 normal, no murmur, click, rub or gallop Extremities: no LEE Pulses: 2+ and symmetric Skin: warm and dry Neurologic: Grossly normal  EKG NSR 58 bpm no ischemic changes  ASSESSMENT AND PLAN:   1. Chest pain: Symptoms are atypical however given his multiple risk factors including a strong family history of CAD, tobacco use as well as hypertension, we will further risk stratify with an exercise nuclear stress study.  2. Hypertension: Blood Pressure is elevated today at 155/98. He states that he ran out of his amlodipine and hydrochlorothiazide- lisinopril 2 days ago. We will refill both medications.  PLAN  Exercise nuclear stress test to rule out ischemia. He was provided refills for his amlodipine and hydrochlorothiazide/lisinopril. Followup in 2 weeks after stress test is complete to review the results and also to reevaluate his blood pressure.  SIMMONS, BRITTAINYPA-C 08/27/2013 4:12 PM

## 2013-08-27 NOTE — Patient Instructions (Signed)
Your physician has requested that you have en exercise stress myoview. For further information please visit [email or web address]. Please follow instruction sheet, as given.  Your physician recommends that you schedule a follow-up appointment in: 2 weeks with DeansBrittainy, PA ,

## 2013-08-28 ENCOUNTER — Ambulatory Visit: Payer: Medicaid Other | Admitting: Pharmacist Clinician (PhC)/ Clinical Pharmacy Specialist

## 2013-09-02 ENCOUNTER — Telehealth (HOSPITAL_COMMUNITY): Payer: Self-pay

## 2013-09-02 NOTE — Telephone Encounter (Signed)
Encounter complete. 

## 2013-09-04 ENCOUNTER — Ambulatory Visit (HOSPITAL_COMMUNITY)
Admission: RE | Admit: 2013-09-04 | Discharge: 2013-09-04 | Disposition: A | Discharge status: Home / Self Care | Payer: Medicaid Other | Source: Ambulatory Visit | Attending: Cardiology | Admitting: Cardiology

## 2013-09-04 DIAGNOSIS — I517 Cardiomegaly: Secondary | ICD-10-CM | POA: Insufficient documentation

## 2013-09-04 DIAGNOSIS — Z8249 Family history of ischemic heart disease and other diseases of the circulatory system: Secondary | ICD-10-CM | POA: Insufficient documentation

## 2013-09-04 DIAGNOSIS — I10 Essential (primary) hypertension: Secondary | ICD-10-CM | POA: Diagnosis not present

## 2013-09-04 DIAGNOSIS — R079 Chest pain, unspecified: Secondary | ICD-10-CM

## 2013-09-04 DIAGNOSIS — Z87891 Personal history of nicotine dependence: Secondary | ICD-10-CM | POA: Insufficient documentation

## 2013-09-04 MED ORDER — REGADENOSON 0.4 MG/5ML IV SOLN
0.4000 mg | Freq: Once | INTRAVENOUS | Status: AC
  Administered 2013-09-04: 0.4 mg via INTRAVENOUS

## 2013-09-04 MED ORDER — AMINOPHYLLINE 25 MG/ML IV SOLN
75.0000 mg | Freq: Once | INTRAVENOUS | Status: AC
  Administered 2013-09-04: 75 mg via INTRAVENOUS

## 2013-09-04 MED ORDER — TECHNETIUM TC 99M SESTAMIBI GENERIC - CARDIOLITE
10.8000 | Freq: Once | INTRAVENOUS | Status: AC | PRN
  Administered 2013-09-04: 11 via INTRAVENOUS

## 2013-09-04 MED ORDER — TECHNETIUM TC 99M SESTAMIBI GENERIC - CARDIOLITE
29.4000 | Freq: Once | INTRAVENOUS | Status: AC | PRN
  Administered 2013-09-04: 29 via INTRAVENOUS

## 2013-09-04 NOTE — Procedures (Addendum)
[name] [address] [place] [address] [phone]  Cardiology Nuclear Med Study  Thomas LibmanSteven L [name] is a 48 y.o. male     MRN : [id number]     DOB: 02/06/1965  Procedure Date: 09/04/2013  Nuclear Med Background Indication for Stress Test:  Evaluation for Ischemia, Post Hospital and Abnormal EKG History:  cardiomegaly;Last NUC MPI on 06/28/2011-EF=57%;Echo -normal LV function Cardiac Risk Factors: Family History - CAD, History of Smoking and Hypertension  Symptoms:  Chest Pain, Dizziness, DOE, Fatigue, Light-Headedness, Palpitations and SOB   Nuclear Pre-Procedure Caffeine/Decaff Intake:  7:00pm NPO After: 5:00am   IV Site: R Forearm  IV 0.9% NS with Angio Cath:  22g  Chest Size (in):  48"  IV Started by: Berdie OgrenAmanda Wease, RN  Height: 6\' 3"  (1.905 m)  Cup Size: n/a  BMI:  Body mass index is 25.5 kg/(m^2). Weight:  204 lb (92.534 kg)   Tech Comments: Test was changed to a Lexiscan due to patient's elevated blood pressure.    Nuclear Med Study 1 or 2 day study: 1 day  Stress Test Type: Lexiscan  Order Authorizing Provider:  Nanetta BattyJonathan Berry, MD   Resting Radionuclide: Technetium 4326m Sestamibi  Resting Radionuclide Dose: 10.8 mCi   Stress Radionuclide:  Technetium 2226m Sestamibi  Stress Radionuclide Dose: 29.4 mCi           Stress Protocol Rest HR: 46 Stress HR: 73  Rest BP: 150/120 Stress BP: 171/119  Exercise Time (min): n/a METS: n/a          Dose of Adenosine (mg):  n/a Dose of Lexiscan: 0.4 mg  Dose of Atropine (mg): n/a Dose of Dobutamine: n/a mcg/kg/min (at max HR)  Stress Test Technologist: Ernestene MentionGwen Farrington, CCT Nuclear Technologist: Gonzella LexPam Phillips, CNMT   Rest Procedure:  Myocardial perfusion imaging was performed at rest 45 minutes following the intravenous administration of Technetium 6226m Sestamibi. Stress Procedure:  The patient received IV Lexiscan 0.4 mg over 15-seconds.  Technetium 5626m Sestamibi  injected iv at 30-seconds. Patient experienced shortness of breath, stomach pains and was administered 75 mg of Aminophylline IV at 5 minutes. There were no significant changes with Lexiscan.  Quantitative spect images were obtained after a 45 minute delay.  Transient Ischemic Dilatation (Normal <1.22):  0.85  QGS EDV:  150 ml QGS ESV:  52 ml LV Ejection Fraction: 65%        Rest ECG: Sinus bradycardia; cannot R/O prior septal MI.  Stress ECG: No significant ST segment change suggestive of ischemia.  QPS Raw Data Images:  Acquisition technically good; normal left ventricular size. Stress Images:  Normal homogeneous uptake in all areas of the myocardium. Rest Images:  Normal homogeneous uptake in all areas of the myocardium. Subtraction (SDS):  No evidence of ischemia.  Impression Exercise Capacity:  Lexiscan with no exercise. BP Response:  Hypertensive blood pressure response. Clinical Symptoms:  There is dyspnea. ECG Impression:  No significant ST segment change suggestive of ischemia. Comparison with Prior Nuclear Study: No images to compare  Overall Impression:  Normal stress nuclear study; note patient extremely hypertensive prior to and during study. May need further adjustment of antihypertensive regimen.  LV Wall Motion:  NL LV Function; NL Wall Motion   [name], MD  09/04/2013 1:23 PM

## 2013-09-08 ENCOUNTER — Encounter: Payer: Self-pay | Admitting: *Deleted

## 2013-09-11 ENCOUNTER — Ambulatory Visit (INDEPENDENT_AMBULATORY_CARE_PROVIDER_SITE_OTHER): Payer: Medicaid Other | Admitting: Cardiology

## 2013-09-11 ENCOUNTER — Encounter: Payer: Self-pay | Admitting: Cardiology

## 2013-09-11 VITALS — BP 142/100 | HR 80 | Ht 75.0 in | Wt 205.0 lb

## 2013-09-11 DIAGNOSIS — I10 Essential (primary) hypertension: Secondary | ICD-10-CM

## 2013-09-11 MED ORDER — HYDRALAZINE HCL 100 MG PO TABS: 100.0000 mg | ORAL_TABLET | Freq: Three times a day (TID) | ORAL | Status: DC

## 2013-09-11 NOTE — Progress Notes (Signed)
Patient ID: Thomas LibmanSteven L [name], male   DOB: 07/23/1965, 48 y.o.   MRN: [id number]    09/11/2013 Thomas [name]   04/03/1965  [id number]  Primary Physicia [name],[name], MD Primary Cardiologist: [name]  HPI:  The patient is a 48 year old African American male, followed by [name].  He presented to clinic 2 weeks ago after recently being seen in the emergency department for evaluation of chest pain. He has multiple cardiac risk factors including a strong family history of CAD noting myocardial infarction in his father in his early 2260s, as well as a personal history of hypertension and tobacco abuse. He reports a 10+ history of tobacco abuse but recently quit smoking 1 month ago. He had a nuclear stress test in 2013 that was low risk for ischemia and also had a 2-D echocardiogram which revealed normal LV function.  [facility] on 08/14/2013 for evaluation of atypical chest pain. Based on the ED provider's note, it appears that he did have some chest pain that was reproducible with palpation. His EKG was nonischemic and his troponin level was normal. He was discharge from the emergency department and was instructed to followup with us for further evaluation.   When he was seen in f/u, he reported recent history of intermittent, atypical chest discomfort. He noted nocturnal, substernal to right-sided chest discomfort, described as sharp/pressure like sensation. Non radiating and not worse with exertion. Nonpleuritic. However, he noted his symptoms were often associated with mild dyspnea, diaphoresis, nausea and vomiting. His symptoms are often self-limited and last for 5-10 minutes. He denied any history of acid reflux and symptoms are not related to heavy meals. He did not try any medications for symptoms.  I orered for him to under go an exercise NST to r/o ischemia as he had mixed atypical and typical symptoms.   Also of note, he stated that he ran out of his  blood pressure medications several days prior including his amlodipine and his hydrochlorothiazide/lisinopril. Blood pressure in clinic that day was 155/98. I refilled his medications.   He presents back to clinic today for reassessment. His NST was negative for ischemia. LVF was normal. His BP is elevated today at 142/100. He states he forgot to take amlodipine yesterday but has been compliant with all of his other meds. He took all prescribed meds this am prior to his appointment. He denies any recurrent chest pain. No HA, dyspnea or visual changes. He has continued to refrain from smoking. He denies excessive caffeine consumption. States that he does not eat a lot of high sodium foods.     Current Outpatient Prescriptions  Medication Sig Dispense Refill  . amLODipine (NORVASC) 10 MG tablet Take 1 tablet (10 mg total) by mouth daily.  90 tablet  3  . buPROPion (WELLBUTRIN) 100 MG tablet Take 100 mg by mouth 2 (two) times daily.      . hydrALAZINE (APRESOLINE) 100 MG tablet Take 100 mg by mouth 2 (two) times daily.      Marland Kitchen. HYDROcodone-acetaminophen (NORCO/VICODIN) 5-325 MG per tablet Take 1 tablet by mouth every 6 (six) hours as needed.  10 tablet  0  . ibuprofen (ADVIL,MOTRIN) 800 MG tablet Take 1 tablet (800 mg total) by mouth 3 (three) times daily.  21 tablet  0  . lisinopril-hydrochlorothiazide (PRINZIDE,ZESTORETIC) 20-12.5 MG per tablet Take 2 tablets by mouth daily.  180 tablet  3  . OVER THE COUNTER MEDICATION Take 1 tablet by  mouth daily. Pure life energy buster      . paliperidone (INVEGA) 6 MG 24 hr tablet Take 6 mg by mouth at bedtime.       No current facility-administered medications for this visit.    No Known Allergies  History   Social History  . Marital Status: Married    Spouse Name: N/A    Number of Children: N/A  . Years of Education: N/A   Occupational History  . Not on file.   Social History Main Topics  . Smoking status: Former Smoker -- 0.25 packs/day for 15  years    Types: Cigarettes    Quit date: 08/06/2013  . Smokeless tobacco: Not on file     Comment: quit in Juy 2015  . Alcohol Use: No     Comment: "off and on" - stopped 4 weeks ago (as of 08/27/13)  . Drug Use: No  . Sexual Activity: Not on file   Other Topics Concern  . Not on file   Social History Narrative  . No narrative on file     Review of Systems: General: negative for chills, fever, night sweats or weight changes.  Cardiovascular: negative for chest pain, dyspnea on exertion, edema, orthopnea, palpitations, paroxysmal nocturnal dyspnea or shortness of breath Dermatological: negative for rash Respiratory: negative for cough or wheezing Urologic: negative for hematuria Abdominal: negative for nausea, vomiting, diarrhea, bright red blood per rectum, melena, or hematemesis Neurologic: negative for visual changes, syncope, or dizziness All other systems reviewed and are otherwise negative except as noted above.    Blood pressure 142/100, pulse 80, height 6\' 3"  (1.905 m), weight 205 lb (92.987 kg).  General appearance: alert, cooperative and no distress Neck: no carotid bruit and no JVD Lungs: clear to auscultation bilaterally Heart: regular rate and rhythm, S1, S2 normal, no murmur, click, rub or gallop Extremities: no LEE Pulses: 2+ and symmetric Skin: warm and dry Neurologic: Grossly normal  EKG NSR 58 bpm no ischemic changes  ASSESSMENT AND PLAN:   1. Chest pain: No recurrent symptoms. NST is negative for ischemia. LV function is normal. Pain highly non cardiac in nature. Continue prevention of CAD with risk modification: better BP control and continuing to refrain for tobacco use.   2. Hypertension: Blood Pressure is elevated today at 142/100. BP was rechecked near the end of visit and diastolic was lower at 95. He is w/o any symptoms. We discussed the importance of daily medication compliance, watching salt intake and continuing to refrain for tobacco use. He was  also encouraged to increase physical activity to help lower BP levels. I recommended that he walk at least 30 minutes a day for exercise. For now we will continue him on his current medications but will increase his hydralazine from BID to TID. F/u with pharmacist in 4 weeks for repeat BP check. Continue regular f/u with PCP. F/u with Dr. Allyson Sabal in 1 year.    SIMMONS, BRITTAINYPA-C 09/11/2013 12:18 PM

## 2013-09-11 NOTE — Patient Instructions (Signed)
CONTINUE CURRENT MEDICATION- EXCEPT INCREASE HYDRALAZINE 100 MG TO THREE TIMES A DAY.  Your physician recommends that you schedule a follow-up appointment in 4-6 WEEKS WITH KRISTIN --BLOOD PRESSURE CHECK  Your physician wants you to follow-up in 12 MONTHS Dr Allyson SabalBERRY.  You will receive a reminder letter in the mail two months in advance. If you don't receive a letter, please call our office to schedule the follow-up appointment.

## 2013-10-16 ENCOUNTER — Ambulatory Visit: Payer: Medicaid Other | Admitting: Pharmacist Clinician (PhC)/ Clinical Pharmacy Specialist

## 2013-12-16 ENCOUNTER — Ambulatory Visit: Payer: Medicaid Other | Admitting: Cardiovascular Disease

## 2014-01-26 ENCOUNTER — Ambulatory Visit (INDEPENDENT_AMBULATORY_CARE_PROVIDER_SITE_OTHER): Payer: Medicaid Other | Admitting: Cardiovascular Disease

## 2014-01-26 ENCOUNTER — Encounter: Payer: Self-pay | Admitting: Cardiovascular Disease

## 2014-01-26 VITALS — BP 156/120 | HR 57 | Ht 75.0 in | Wt 213.9 lb

## 2014-01-26 DIAGNOSIS — E785 Hyperlipidemia, unspecified: Secondary | ICD-10-CM

## 2014-01-26 DIAGNOSIS — Z720 Tobacco use: Secondary | ICD-10-CM

## 2014-01-26 DIAGNOSIS — I10 Essential (primary) hypertension: Secondary | ICD-10-CM

## 2014-01-26 DIAGNOSIS — F17200 Nicotine dependence, unspecified, uncomplicated: Secondary | ICD-10-CM

## 2014-01-26 NOTE — Assessment & Plan Note (Signed)
History of hypertension with blood pressure measured today at 156/120 secondary to dietary and medication not noncompliance

## 2014-01-26 NOTE — Assessment & Plan Note (Signed)
History of hyperlipidemia with lipid profile measured last 01/08/13 with a total cholesterol 224, LDL of 163 and HDL 47. He does have multiple positive risk factors. While I think he would benefit from being on a statin drug I do not think he would be compliant.

## 2014-01-26 NOTE — Assessment & Plan Note (Signed)
Continued tobacco abuse recalcitrant to risk factor modification 

## 2014-01-26 NOTE — Patient Instructions (Signed)
Your physician wants you to follow-up in 1 year with Dr. Berry. You will receive a reminder letter in the mail 2 months in advance. If you do not receive a letter, please call our office to schedule the follow-up appointment.  

## 2014-01-26 NOTE — Progress Notes (Signed)
01/26/2014 Thomas LibmanSteven L Pizano   05/22/1965  [id number]  Primary Physician [name],[name], MD Primary Cardiologist: Runell GessJonathan J. Coretha Creswell MD Roseanne RenoFACP,FACC,FAHA, FSCAI   HPI:  The patient is a 49-year--old, fit-appearing, married [place] male, father of 5 children, grandfather to 3 grandchildren. 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 He is referred through the courtesy of Dr. Concepcion ElkAvbuere for cardiovascular evaluation because of an abnormal EKG.   His cardiovascular risk factor profile is positive for long history of hypertension. He is not diabetic or hyperlipidemic that he knows. His father had MI in his 7460s. He does smoke 4 to 5 cigarettes a day which he has been doing for the last 12 years. n  I last saw him 6 months ago. A 2-D echo performed 07/08/11 was normal as was a Myoview stress test. He does admit to dietary and medication noncompliance and his blood pressure today is markedly elevated.  Current Outpatient Prescriptions  Medication Sig Dispense Refill  . amLODipine (NORVASC) 10 MG tablet Take 1 tablet (10 mg total) by mouth daily. 90 tablet 3  . buPROPion (WELLBUTRIN) 100 MG tablet Take 100 mg by mouth 2 (two) times daily.    . hydrALAZINE (APRESOLINE) 100 MG tablet Take 1 tablet (100 mg total) by mouth 3 (three) times daily. 90 tablet 6  . HYDROcodone-acetaminophen (NORCO/VICODIN) 5-325 MG per tablet Take 1 tablet by mouth every 6 (six) hours as needed. 10 tablet 0  . ibuprofen (ADVIL,MOTRIN) 800 MG tablet Take 1 tablet (800 mg total) by mouth 3 (three) times daily. 21 tablet 0  . lisinopril-hydrochlorothiazide (PRINZIDE,ZESTORETIC) 20-12.5 MG per tablet Take 2 tablets by mouth daily. 180 tablet 3  . OVER THE COUNTER MEDICATION Take 1 tablet by mouth daily. Pure life energy buster    . paliperidone (INVEGA) 6 MG 24 hr tablet Take 6 mg by mouth at bedtime.     No current  facility-administered medications for this visit.    No Known Allergies  History   Social History  . Marital Status: Married    Spouse Name: N/A    Number of Children: N/A  . Years of Education: N/A   Occupational History  . Not on file.   Social History Main Topics  . Smoking status: Current Some Day Smoker -- 0.25 packs/day for 15 years    Types: Cigarettes    Last Attempt to Quit: 08/06/2013  . Smokeless tobacco: Not on file     Comment: quit in Juy 2015  . Alcohol Use: No     Comment: "off and on" - stopped 4 weeks ago (as of 08/27/13)  . Drug Use: No  . Sexual Activity: Not on file   Other Topics Concern  . Not on file   Social History Narrative     Review of Systems: General: negative for chills, fever, night sweats or weight changes.  Cardiovascular: negative for chest pain, dyspnea on exertion, edema, orthopnea, palpitations, paroxysmal nocturnal dyspnea or shortness of breath Dermatological: negative for rash Respiratory: negative for cough or wheezing Urologic: negative for hematuria Abdominal: negative for nausea, vomiting, diarrhea, bright red blood per rectum, melena, or hematemesis Neurologic: negative for visual changes, syncope, or dizziness All other systems reviewed and are otherwise negative except as noted above.    Blood pressure 156/120, pulse 57, height 6\' 3"  (1.905 m), weight 213 lb 14.4 oz (97.024 kg).  General  appearance: alert and no distress Neck: no adenopathy, no carotid bruit, no JVD, supple, symmetrical, trachea midline and thyroid not enlarged, symmetric, no tenderness/mass/nodules Lungs: clear to auscultation bilaterally Heart: regular rate and rhythm, S1, S2 normal, no murmur, click, rub or gallop Extremities: extremities normal, atraumatic, no cyanosis or edema  EKG sinus bradycardia 57 with left axis deviation and evidence of left ventricular hypertrophy. I personally reviewed this EKG  ASSESSMENT AND PLAN:   TOBACCO  ABUSE Continued tobacco abuse recalcitrant to risk factor modification  HYPERTENSION, BENIGN ESSENTIAL History of hypertension with blood pressure measured today at 156/120 secondary to dietary and medication not noncompliance  Hyperlipidemia History of hyperlipidemia with lipid profile measured last 01/08/13 with a total cholesterol 224, LDL of 163 and HDL 47. He does have multiple positive risk factors. While I think he would benefit from being on a statin drug I do not think he would be compliant.      Runell Gess MD FACP,FACC,FAHA, [facility] 01/26/2014 12:25 PM

## 2014-01-28 ENCOUNTER — Encounter (HOSPITAL_COMMUNITY): Payer: Self-pay | Admitting: Emergency Medicine

## 2014-01-28 ENCOUNTER — Emergency Department (HOSPITAL_COMMUNITY): Payer: Self-pay

## 2014-01-28 ENCOUNTER — Emergency Department (HOSPITAL_COMMUNITY)
Admission: EM | Admit: 2014-01-28 | Discharge: 2014-01-28 | Disposition: A | Discharge status: Home / Self Care | Payer: Self-pay | Attending: Emergency Medicine | Admitting: Emergency Medicine

## 2014-01-28 DIAGNOSIS — Z9119 Patient's noncompliance with other medical treatment and regimen: Secondary | ICD-10-CM | POA: Insufficient documentation

## 2014-01-28 DIAGNOSIS — Y998 Other external cause status: Secondary | ICD-10-CM | POA: Insufficient documentation

## 2014-01-28 DIAGNOSIS — S3992XA Unspecified injury of lower back, initial encounter: Secondary | ICD-10-CM | POA: Insufficient documentation

## 2014-01-28 DIAGNOSIS — S199XXA Unspecified injury of neck, initial encounter: Secondary | ICD-10-CM | POA: Insufficient documentation

## 2014-01-28 DIAGNOSIS — Y92410 Unspecified street and highway as the place of occurrence of the external cause: Secondary | ICD-10-CM | POA: Insufficient documentation

## 2014-01-28 DIAGNOSIS — Z8639 Personal history of other endocrine, nutritional and metabolic disease: Secondary | ICD-10-CM | POA: Insufficient documentation

## 2014-01-28 DIAGNOSIS — Y9389 Activity, other specified: Secondary | ICD-10-CM | POA: Insufficient documentation

## 2014-01-28 DIAGNOSIS — Z79899 Other long term (current) drug therapy: Secondary | ICD-10-CM | POA: Insufficient documentation

## 2014-01-28 DIAGNOSIS — Z720 Tobacco use: Secondary | ICD-10-CM | POA: Insufficient documentation

## 2014-01-28 DIAGNOSIS — I10 Essential (primary) hypertension: Secondary | ICD-10-CM | POA: Insufficient documentation

## 2014-01-28 DIAGNOSIS — R52 Pain, unspecified: Secondary | ICD-10-CM

## 2014-01-28 DIAGNOSIS — M545 Low back pain, unspecified: Secondary | ICD-10-CM

## 2014-01-28 DIAGNOSIS — Z791 Long term (current) use of non-steroidal anti-inflammatories (NSAID): Secondary | ICD-10-CM | POA: Insufficient documentation

## 2014-01-28 DIAGNOSIS — M542 Cervicalgia: Secondary | ICD-10-CM

## 2014-01-28 MED ORDER — CYCLOBENZAPRINE HCL 10 MG PO TABS: 10.0000 mg | ORAL_TABLET | Freq: Three times a day (TID) | ORAL | Status: DC | PRN

## 2014-01-28 MED ORDER — HYDROCODONE-ACETAMINOPHEN 5-325 MG PO TABS: 1.0000 | ORAL_TABLET | ORAL | Status: DC | PRN

## 2014-01-28 MED ORDER — HYDROCODONE-ACETAMINOPHEN 5-325 MG PO TABS
1.0000 | ORAL_TABLET | Freq: Once | ORAL | Status: AC
  Administered 2014-01-28: 1 via ORAL
  Filled 2014-01-28: qty 1

## 2014-01-28 NOTE — ED Notes (Signed)
Patient transported to X-ray 
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
## 2014-01-28 NOTE — Discharge Instructions (Signed)
Read the information below.  Use the prescribed medication as directed.  Please discuss all new medications with your pharmacist.  Do not take additional tylenol while taking the prescribed pain medication to avoid overdose.  You may return to the Emergency Department at any time for worsening condition or any new symptoms that concern you.    If you develop fevers, loss of control of bowel or bladder, weakness or numbness in your legs, or are unable to walk, return to the ER for a recheck.  °
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
## 2014-03-05 ENCOUNTER — Ambulatory Visit: Payer: Self-pay | Admitting: Physician Assistant

## 2015-01-25 ENCOUNTER — Encounter: Payer: Self-pay | Admitting: Cardiovascular Disease

## 2015-01-25 ENCOUNTER — Ambulatory Visit (INDEPENDENT_AMBULATORY_CARE_PROVIDER_SITE_OTHER): Payer: Medicaid Other | Admitting: Cardiovascular Disease

## 2015-01-25 VITALS — BP 158/120 | HR 61 | Ht 75.0 in | Wt 193.0 lb

## 2015-01-25 DIAGNOSIS — I10 Essential (primary) hypertension: Secondary | ICD-10-CM

## 2015-01-25 DIAGNOSIS — E785 Hyperlipidemia, unspecified: Secondary | ICD-10-CM

## 2015-01-25 NOTE — Progress Notes (Signed)
01/25/2015 Thomas [name]   14-May-1965  [id number]  Primary Physician Dorrene German, MD Primary Cardiologist: Runell Gess MD [name]   HPI:  Thomas [name] is a 50 year old thin and fit-appearing married African-American male father of 5 children, grandfather of 3 grandchildren. He is currently disabled because of bipolar disorder. He has a history of hypertension and hyperlipidemia as well as tobacco abuse. He had a normal 2-D echo 07/08/11 as was a Myoview stress test. He has lost 20 pounds until I saw him. He is asymptomatic.   Current Outpatient Prescriptions  Medication Sig Dispense Refill  . amLODipine (NORVASC) 10 MG tablet Take 1 tablet (10 mg total) by mouth daily. 90 tablet 3  . buPROPion (WELLBUTRIN) 100 MG tablet Take 100 mg by mouth 2 (two) times daily.    . cyclobenzaprine (FLEXERIL) 10 MG tablet Take 1 tablet (10 mg total) by mouth 3 (three) times daily as needed for muscle spasms (or pain). 15 tablet 0  . hydrALAZINE (APRESOLINE) 100 MG tablet Take 1 tablet (100 mg total) by mouth 3 (three) times daily. 90 tablet 6  . HYDROcodone-acetaminophen (NORCO/VICODIN) 5-325 MG per tablet Take 1 tablet by mouth every 4 (four) hours as needed for moderate pain or severe pain. 15 tablet 0  . ibuprofen (ADVIL,MOTRIN) 800 MG tablet Take 1 tablet (800 mg total) by mouth 3 (three) times daily. (Patient taking differently: Take 800 mg by mouth every 6 (six) hours as needed. ) 21 tablet 0  . lisinopril-hydrochlorothiazide (PRINZIDE,ZESTORETIC) 20-12.5 MG per tablet Take 2 tablets by mouth daily. 180 tablet 3  . OVER THE COUNTER MEDICATION Take 1 tablet by mouth daily. Pure life energy buster    . paliperidone (INVEGA) 6 MG 24 hr tablet Take 6 mg by mouth at bedtime.     No current facility-administered medications for this visit.    No Known Allergies  Social History   Social History  . Marital Status: Married    Spouse Name: N/A  . Number of Children: N/A  .  Years of Education: N/A   Occupational History  . Not on file.   Social History Main Topics  . Smoking status: Current Some Day Smoker -- 0.25 packs/day for 15 years    Types: Cigarettes    Last Attempt to Quit: 08/06/2013  . Smokeless tobacco: Not on file     Comment: quit in Juy 2015  . Alcohol Use: No     Comment: "off and on" - stopped 4 weeks ago (as of 08/27/13)  . Drug Use: No  . Sexual Activity: Not on file   Other Topics Concern  . Not on file   Social History Narrative     Review of Systems: General: negative for chills, fever, night sweats or weight changes.  Cardiovascular: negative for chest pain, dyspnea on exertion, edema, orthopnea, palpitations, paroxysmal nocturnal dyspnea or shortness of breath Dermatological: negative for rash Respiratory: negative for cough or wheezing Urologic: negative for hematuria Abdominal: negative for nausea, vomiting, diarrhea, bright red blood per rectum, melena, or hematemesis Neurologic: negative for visual changes, syncope, or dizziness All other systems reviewed and are otherwise negative except as noted above.    Blood pressure 158/120, pulse 61, height 6\' 3"  (1.905 m), weight 193 lb (87.544 kg).  General appearance: alert and no distress Neck: no adenopathy, no carotid bruit, no JVD, supple, symmetrical, trachea midline and thyroid not enlarged, symmetric, no tenderness/mass/nodules Lungs: clear to auscultation bilaterally Heart: regular rate and  rhythm, S1, S2 normal, no murmur, click, rub or gallop Extremities: extremities normal, atraumatic, no cyanosis or edema  EKG normal sinus rhythm at 61 with septal Q waves.  Reviewed this EKG  ASSESSMENT AND PLAN:   HYPERTENSION, BENIGN ESSENTIAL History of hypertension blood pressure measured at 152/60. He is on amlodipine, hydralazine, lisinopril and hydrochlorothiazide. Continue current meds at current dosing  Hyperlipidemia History of hyperlipidemia currently not on a  statin drug. We will recheck a lipid liver profile      Runell GessJonathan J. Therman Hughlett MD [facility] CampusFACP,FACC,FAHA, [facility] Center[facility]SCAI 01/25/2015 11:16 AM

## 2015-01-25 NOTE — Patient Instructions (Signed)
Medication Instructions:  Your physician recommends that you continue on your current medications as directed. Please refer to the Current Medication list given to you today.   Labwork: Your physician recommends that you return for lab work in: FASTING (lipid/liver) The lab can be found on the FIRST FLOOR of out building in [address]   Testing/Procedures: none  Follow-Up: Follow up with Dr. Berry as needed.   Any Other Special Instructions Will Be Listed Below (If Applicable).     If you need a refill on your cardiac medications before your next appointment, please call your pharmacy.   

## 2015-01-25 NOTE — Assessment & Plan Note (Signed)
History of hyperlipidemia currently not on a statin drug. We will recheck a lipid liver profile

## 2015-01-25 NOTE — Assessment & Plan Note (Signed)
History of hypertension blood pressure measured at 152/60. He is on amlodipine, hydralazine, lisinopril and hydrochlorothiazide. Continue current meds at current dosing

## 2015-11-06 ENCOUNTER — Encounter (HOSPITAL_COMMUNITY): Payer: Self-pay

## 2015-11-06 ENCOUNTER — Emergency Department (HOSPITAL_COMMUNITY): Payer: Medicaid Other

## 2015-11-06 DIAGNOSIS — Z79899 Other long term (current) drug therapy: Secondary | ICD-10-CM

## 2015-11-06 DIAGNOSIS — N183 Chronic kidney disease, stage 3 (moderate): Secondary | ICD-10-CM | POA: Diagnosis present

## 2015-11-06 DIAGNOSIS — Z8249 Family history of ischemic heart disease and other diseases of the circulatory system: Secondary | ICD-10-CM

## 2015-11-06 DIAGNOSIS — E876 Hypokalemia: Secondary | ICD-10-CM | POA: Diagnosis not present

## 2015-11-06 DIAGNOSIS — F17210 Nicotine dependence, cigarettes, uncomplicated: Secondary | ICD-10-CM | POA: Diagnosis present

## 2015-11-06 DIAGNOSIS — E785 Hyperlipidemia, unspecified: Secondary | ICD-10-CM | POA: Diagnosis present

## 2015-11-06 DIAGNOSIS — I160 Hypertensive urgency: Secondary | ICD-10-CM | POA: Diagnosis present

## 2015-11-06 DIAGNOSIS — F319 Bipolar disorder, unspecified: Secondary | ICD-10-CM | POA: Diagnosis present

## 2015-11-06 DIAGNOSIS — N179 Acute kidney failure, unspecified: Secondary | ICD-10-CM | POA: Diagnosis present

## 2015-11-06 DIAGNOSIS — I639 Cerebral infarction, unspecified: Principal | ICD-10-CM | POA: Diagnosis present

## 2015-11-06 DIAGNOSIS — R001 Bradycardia, unspecified: Secondary | ICD-10-CM | POA: Diagnosis present

## 2015-11-06 DIAGNOSIS — H538 Other visual disturbances: Secondary | ICD-10-CM | POA: Diagnosis present

## 2015-11-06 DIAGNOSIS — Z823 Family history of stroke: Secondary | ICD-10-CM

## 2015-11-06 DIAGNOSIS — Z9114 Patient's other noncompliance with medication regimen: Secondary | ICD-10-CM

## 2015-11-06 DIAGNOSIS — I10 Essential (primary) hypertension: Secondary | ICD-10-CM | POA: Diagnosis present

## 2015-11-06 DIAGNOSIS — Z7982 Long term (current) use of aspirin: Secondary | ICD-10-CM

## 2015-11-06 DIAGNOSIS — R29810 Facial weakness: Secondary | ICD-10-CM | POA: Diagnosis present

## 2015-11-06 LAB — I-STAT CHEM 8, ED
BUN: 18 mg/dL (ref 6–20)
CHLORIDE: 103 mmol/L (ref 101–111)
CREATININE: 1.9 mg/dL — AB (ref 0.61–1.24)
Calcium, Ion: 1.17 mmol/L (ref 1.15–1.40)
GLUCOSE: 94 mg/dL (ref 65–99)
HCT: 46 % (ref 39.0–52.0)
HEMOGLOBIN: 15.6 g/dL (ref 13.0–17.0)
POTASSIUM: 3.7 mmol/L (ref 3.5–5.1)
Sodium: 141 mmol/L (ref 135–145)
TCO2: 29 mmol/L (ref 0–100)

## 2015-11-06 LAB — CBG MONITORING, ED: Glucose-Capillary: 121 mg/dL — ABNORMAL HIGH (ref 65–99)

## 2015-11-06 LAB — COMPREHENSIVE METABOLIC PANEL
ALK PHOS: 77 U/L (ref 38–126)
ALT: 112 U/L — AB (ref 17–63)
AST: 99 U/L — ABNORMAL HIGH (ref 15–41)
Albumin: 3.8 g/dL (ref 3.5–5.0)
Anion gap: 8 (ref 5–15)
BUN: 16 mg/dL (ref 6–20)
CALCIUM: 9.7 mg/dL (ref 8.9–10.3)
CO2: 27 mmol/L (ref 22–32)
CREATININE: 1.81 mg/dL — AB (ref 0.61–1.24)
Chloride: 104 mmol/L (ref 101–111)
GFR calc non Af Amer: 42 mL/min — ABNORMAL LOW (ref >60)
GFR, EST AFRICAN AMERICAN: 49 mL/min — AB (ref >60)
Glucose, Bld: 96 mg/dL (ref 65–99)
Potassium: 3.6 mmol/L (ref 3.5–5.1)
SODIUM: 139 mmol/L (ref 135–145)
Total Bilirubin: 0.9 mg/dL (ref 0.3–1.2)
Total Protein: 7.3 g/dL (ref 6.5–8.1)

## 2015-11-06 LAB — DIFFERENTIAL
Basophils Absolute: 0 10*3/uL (ref 0.0–0.1)
Basophils Relative: 1 %
Eosinophils Absolute: 0.2 10*3/uL (ref 0.0–0.7)
Eosinophils Relative: 4 %
LYMPHS ABS: 2.5 10*3/uL (ref 0.7–4.0)
LYMPHS PCT: 57 %
MONO ABS: 0.4 10*3/uL (ref 0.1–1.0)
MONOS PCT: 9 %
NEUTROS ABS: 1.3 10*3/uL — AB (ref 1.7–7.7)
Neutrophils Relative %: 29 %

## 2015-11-06 LAB — APTT: aPTT: 31 seconds (ref 24–36)

## 2015-11-06 LAB — PROTIME-INR
INR: 1.12
Prothrombin Time: 14.5 seconds (ref 11.4–15.2)

## 2015-11-06 LAB — CBC
HEMATOCRIT: 43.5 % (ref 39.0–52.0)
HEMOGLOBIN: 15.1 g/dL (ref 13.0–17.0)
MCH: 30.1 pg (ref 26.0–34.0)
MCHC: 34.7 g/dL (ref 30.0–36.0)
MCV: 86.7 fL (ref 78.0–100.0)
PLATELETS: 161 10*3/uL (ref 150–400)
RBC: 5.02 MIL/uL (ref 4.22–5.81)
RDW: 14.8 % (ref 11.5–15.5)
WBC: 4.4 10*3/uL (ref 4.0–10.5)

## 2015-11-06 LAB — I-STAT TROPONIN, ED: Troponin i, poc: 0.03 ng/mL (ref 0.00–0.08)

## 2015-11-06 NOTE — ED Triage Notes (Signed)
Pt states trouble seeing out of right eye. Pt complaining of intermittent blurry vision. Pt also complaining of weakness in R arm x 2 days. No other neuro deficits noted at triage.

## 2015-11-07 ENCOUNTER — Inpatient Hospital Stay (HOSPITAL_COMMUNITY): Payer: Medicaid Other

## 2015-11-07 ENCOUNTER — Encounter (HOSPITAL_COMMUNITY): Payer: Self-pay | Admitting: Emergency Medicine

## 2015-11-07 ENCOUNTER — Emergency Department (HOSPITAL_COMMUNITY): Payer: Medicaid Other

## 2015-11-07 ENCOUNTER — Inpatient Hospital Stay (HOSPITAL_COMMUNITY)
Admission: EM | Admit: 2015-11-07 | Discharge: 2015-11-09 | DRG: 065 | Disposition: A | Discharge status: Home / Self Care | Payer: Medicaid Other | Attending: Family Medicine | Admitting: Family Medicine

## 2015-11-07 DIAGNOSIS — I10 Essential (primary) hypertension: Secondary | ICD-10-CM | POA: Diagnosis present

## 2015-11-07 DIAGNOSIS — I639 Cerebral infarction, unspecified: Secondary | ICD-10-CM | POA: Diagnosis present

## 2015-11-07 DIAGNOSIS — N183 Chronic kidney disease, stage 3 unspecified: Secondary | ICD-10-CM

## 2015-11-07 DIAGNOSIS — Z7982 Long term (current) use of aspirin: Secondary | ICD-10-CM | POA: Diagnosis not present

## 2015-11-07 DIAGNOSIS — R29810 Facial weakness: Secondary | ICD-10-CM | POA: Diagnosis present

## 2015-11-07 DIAGNOSIS — E784 Other hyperlipidemia: Secondary | ICD-10-CM | POA: Diagnosis not present

## 2015-11-07 DIAGNOSIS — F319 Bipolar disorder, unspecified: Secondary | ICD-10-CM | POA: Diagnosis present

## 2015-11-07 DIAGNOSIS — R001 Bradycardia, unspecified: Secondary | ICD-10-CM

## 2015-11-07 DIAGNOSIS — F17200 Nicotine dependence, unspecified, uncomplicated: Secondary | ICD-10-CM | POA: Diagnosis present

## 2015-11-07 DIAGNOSIS — E876 Hypokalemia: Secondary | ICD-10-CM | POA: Diagnosis not present

## 2015-11-07 DIAGNOSIS — N179 Acute kidney failure, unspecified: Secondary | ICD-10-CM | POA: Diagnosis present

## 2015-11-07 DIAGNOSIS — Z79899 Other long term (current) drug therapy: Secondary | ICD-10-CM | POA: Diagnosis not present

## 2015-11-07 DIAGNOSIS — Z823 Family history of stroke: Secondary | ICD-10-CM | POA: Diagnosis not present

## 2015-11-07 DIAGNOSIS — I160 Hypertensive urgency: Secondary | ICD-10-CM | POA: Diagnosis present

## 2015-11-07 DIAGNOSIS — R202 Paresthesia of skin: Secondary | ICD-10-CM

## 2015-11-07 DIAGNOSIS — Z8249 Family history of ischemic heart disease and other diseases of the circulatory system: Secondary | ICD-10-CM | POA: Diagnosis not present

## 2015-11-07 DIAGNOSIS — Z9114 Patient's other noncompliance with medication regimen: Secondary | ICD-10-CM | POA: Diagnosis not present

## 2015-11-07 DIAGNOSIS — H538 Other visual disturbances: Secondary | ICD-10-CM | POA: Diagnosis present

## 2015-11-07 DIAGNOSIS — E785 Hyperlipidemia, unspecified: Secondary | ICD-10-CM | POA: Diagnosis present

## 2015-11-07 DIAGNOSIS — R531 Weakness: Secondary | ICD-10-CM | POA: Diagnosis not present

## 2015-11-07 DIAGNOSIS — N189 Chronic kidney disease, unspecified: Secondary | ICD-10-CM | POA: Diagnosis present

## 2015-11-07 DIAGNOSIS — I6789 Other cerebrovascular disease: Secondary | ICD-10-CM | POA: Diagnosis not present

## 2015-11-07 DIAGNOSIS — F17210 Nicotine dependence, cigarettes, uncomplicated: Secondary | ICD-10-CM | POA: Diagnosis present

## 2015-11-07 HISTORY — DX: Cerebral infarction, unspecified: I63.9

## 2015-11-07 LAB — URINALYSIS, ROUTINE W REFLEX MICROSCOPIC
Bilirubin Urine: NEGATIVE
GLUCOSE, UA: NEGATIVE mg/dL
Hgb urine dipstick: NEGATIVE
KETONES UR: NEGATIVE mg/dL
LEUKOCYTES UA: NEGATIVE
NITRITE: NEGATIVE
PROTEIN: NEGATIVE mg/dL
Specific Gravity, Urine: 1.014 (ref 1.005–1.030)
pH: 6 (ref 5.0–8.0)

## 2015-11-07 LAB — RAPID URINE DRUG SCREEN, HOSP PERFORMED
Amphetamines: NOT DETECTED
BARBITURATES: NOT DETECTED
Benzodiazepines: NOT DETECTED
COCAINE: NOT DETECTED
OPIATES: NOT DETECTED
Tetrahydrocannabinol: POSITIVE — AB

## 2015-11-07 LAB — TROPONIN I
TROPONIN I: 0.04 ng/mL — AB (ref <0.03)
Troponin I: 0.04 ng/mL (ref <0.03)
Troponin I: 0.04 ng/mL (ref <0.03)

## 2015-11-07 LAB — PROTIME-INR
INR: 1.08
Prothrombin Time: 14.1 seconds (ref 11.4–15.2)

## 2015-11-07 LAB — APTT: aPTT: 30 seconds (ref 24–36)

## 2015-11-07 MED ORDER — HYDRALAZINE HCL 20 MG/ML IJ SOLN
20.0000 mg | Freq: Once | INTRAMUSCULAR | Status: AC
  Administered 2015-11-07: 20 mg via INTRAVENOUS
  Filled 2015-11-07: qty 1

## 2015-11-07 MED ORDER — HYDRALAZINE HCL 50 MG PO TABS: 50.0000 mg | ORAL_TABLET | Freq: Two times a day (BID) | ORAL | Status: DC

## 2015-11-07 MED ORDER — SENNOSIDES-DOCUSATE SODIUM 8.6-50 MG PO TABS: 1.0000 | ORAL_TABLET | Freq: Every evening | ORAL | Status: DC | PRN

## 2015-11-07 MED ORDER — ACETAMINOPHEN 650 MG RE SUPP: 650.0000 mg | RECTAL | Status: DC | PRN

## 2015-11-07 MED ORDER — GADOBENATE DIMEGLUMINE 529 MG/ML IV SOLN
20.0000 mL | Freq: Once | INTRAVENOUS | Status: AC | PRN
  Administered 2015-11-07: 20 mL via INTRAVENOUS

## 2015-11-07 MED ORDER — STROKE: EARLY STAGES OF RECOVERY BOOK
Freq: Once | Status: DC
  Filled 2015-11-07: qty 1

## 2015-11-07 MED ORDER — BUPROPION HCL ER (SR) 100 MG PO TB12
100.0000 mg | ORAL_TABLET | Freq: Two times a day (BID) | ORAL | Status: DC
  Administered 2015-11-07 – 2015-11-09 (×6): 100 mg via ORAL
  Filled 2015-11-07 (×7): qty 1

## 2015-11-07 MED ORDER — ACETAMINOPHEN 325 MG PO TABS: 650.0000 mg | ORAL_TABLET | ORAL | Status: DC | PRN

## 2015-11-07 MED ORDER — LISINOPRIL 20 MG PO TABS
20.0000 mg | ORAL_TABLET | Freq: Every day | ORAL | Status: DC
  Administered 2015-11-07 – 2015-11-09 (×3): 20 mg via ORAL
  Filled 2015-11-07 (×3): qty 1

## 2015-11-07 MED ORDER — HYDRALAZINE HCL 20 MG/ML IJ SOLN: 5.0000 mg | Freq: Four times a day (QID) | INTRAMUSCULAR | Status: DC | PRN

## 2015-11-07 MED ORDER — HYDRALAZINE HCL 20 MG/ML IJ SOLN: 5.0000 mg | Freq: Three times a day (TID) | INTRAMUSCULAR | Status: DC | PRN

## 2015-11-07 MED ORDER — HYDRALAZINE HCL 50 MG PO TABS
50.0000 mg | ORAL_TABLET | Freq: Two times a day (BID) | ORAL | Status: DC
  Administered 2015-11-07 – 2015-11-09 (×5): 50 mg via ORAL
  Filled 2015-11-07 (×5): qty 1

## 2015-11-07 MED ORDER — HYDROCHLOROTHIAZIDE 12.5 MG PO CAPS
12.5000 mg | ORAL_CAPSULE | Freq: Every day | ORAL | Status: DC
  Administered 2015-11-07 – 2015-11-09 (×3): 12.5 mg via ORAL
  Filled 2015-11-07 (×3): qty 1

## 2015-11-07 MED ORDER — AMLODIPINE BESYLATE 10 MG PO TABS
10.0000 mg | ORAL_TABLET | Freq: Every day | ORAL | Status: DC
Start: 2015-11-08 — End: 2015-11-09
  Administered 2015-11-08 – 2015-11-09 (×2): 10 mg via ORAL
  Filled 2015-11-07 (×2): qty 1

## 2015-11-07 MED ORDER — PALIPERIDONE ER 6 MG PO TB24
6.0000 mg | ORAL_TABLET | Freq: Every day | ORAL | Status: DC
  Administered 2015-11-07 – 2015-11-08 (×2): 6 mg via ORAL
  Filled 2015-11-07 (×3): qty 1

## 2015-11-07 MED ORDER — SODIUM CHLORIDE 0.9 % IV SOLN
INTRAVENOUS | Status: DC
  Administered 2015-11-07: 16:00:00 via INTRAVENOUS

## 2015-11-07 MED ORDER — LISINOPRIL-HYDROCHLOROTHIAZIDE 20-12.5 MG PO TABS: 2.0000 | ORAL_TABLET | Freq: Every day | ORAL | Status: DC

## 2015-11-07 MED ORDER — HEPARIN SODIUM (PORCINE) 5000 UNIT/ML IJ SOLN
5000.0000 [IU] | Freq: Three times a day (TID) | INTRAMUSCULAR | Status: DC
  Administered 2015-11-07 – 2015-11-09 (×7): 5000 [IU] via SUBCUTANEOUS
  Filled 2015-11-07 (×7): qty 1

## 2015-11-07 MED ORDER — ASPIRIN EC 325 MG PO TBEC
325.0000 mg | DELAYED_RELEASE_TABLET | Freq: Every day | ORAL | Status: DC
Start: 2015-11-07 — End: 2015-11-09
  Administered 2015-11-07 – 2015-11-09 (×3): 325 mg via ORAL
  Filled 2015-11-07 (×3): qty 1

## 2015-11-07 NOTE — ED Notes (Signed)
Pt in MRI right now. Was given Hydralazine earlier for BP. Pt passed swallow eval, was ordered a lunch tray.

## 2015-11-07 NOTE — Consult Note (Signed)
Neurology Consult Note  Reason for Consultation: R-sided weakness  Requesting provider: Melene Plan  CC: Blurry vision R eye, tingling R arm, weakness R face and arm  HPI: This is a 50 year old right-handed man who presents to the emergency department at the urging of his wife for evaluation of various complaints including blurry vision, tingling in the right arm, and weakness in the right face and arm. History obtained from the patient. His wife is present and offers additional information as needed.  The patient initially states that he has had some weakness in the right arm and face for the past 2 days. He is unable to pinpoint exactly when this started and it is not clear that he ever really experienced the abrupt onset of weakness. However, he does feel as if this has gotten worse over the past 24 hours. 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 In addition, he states that he has had some numbness and tingling in the right arm. However, he says that this is only present when he first wakes up in the morning. With regards to his vision complaints, he says he has blurry vision in his right eye and says this is been present for the past week. His wife believes that all of these symptoms have been present for more than a week but she to feels that they've gotten worse over the past 24-48 hours. This is what prompted her to bring him to the emergency department for further evaluation.  PMH:  1. Hypertension 2. Elevated cholesterol 3. Tobacco abuse 4. Bipolar disorder  PSH:  Past Surgical History:  Procedure Laterality Date  . NM MYOCAR PERF WALL MOTION  06/28/2011   protocol Bruce, normal perfusion nin all regions, post stress EF 57%,, exercise cap  . TRANSTHORACIC ECHOCARDIOGRAM  06/28/2011   EF=55%, Proximal septal thickening, borderline LA enlargement, boarderline  aortic root dialation    Family history: Family History  Problem Relation Age of Onset  . Hypertension Mother   . Diabetes Mother   . Stroke Mother   . Hypertension Father   . Diabetes Father   . Stroke Father   . Hypertension Sister   . Stroke Sister   . Diabetes Sister   . Hyperlipidemia Maternal Grandmother   . Diabetes Maternal Grandmother   . Asthma Daughter   . Asthma Son     Social history: I have reviewed social history with the patient. He is married and lives with his wife and son. The patient admits to smoking cigarettes and says that 1 pack of cigarette will last him about one week. However, both his wife and son state that he generally goes through pack every 2 days. He drinks alcohol mainly on weekends and says that he will drink up to 6 beers with some liquor when he drinks. He denies any illicit drug use. Previous notes from his cardiologist indicate that he has a history of bipolar disorder for which she is on disability.   Current outpatient meds: Current Meds  Medication Sig  . amLODipine (NORVASC) 10 MG tablet Take 1 tablet (10 mg total) by mouth daily.  Marland Kitchen aspirin EC 325 MG tablet Take 325 mg by mouth daily.  Marland Kitchen buPROPion (WELLBUTRIN SR) 100 MG 12 hr tablet Take 100 mg by mouth 2 (two) times daily.  . hydrALAZINE (APRESOLINE) 50 MG tablet Take  50 mg by mouth 2 (two) times daily.  Marland Kitchen lisinopril-hydrochlorothiazide (PRINZIDE,ZESTORETIC) 20-12.5 MG per tablet Take 2 tablets by mouth daily.  . paliperidone (INVEGA) 6 MG 24 hr tablet Take 6 mg by mouth at bedtime.    Current inpatient meds:  No current facility-administered medications for this encounter.    Current Outpatient Prescriptions  Medication Sig Dispense Refill  . amLODipine (NORVASC) 10 MG tablet Take 1 tablet (10 mg total) by mouth daily. 90 tablet 3  . aspirin EC 325 MG tablet Take 325 mg by mouth daily.    Marland Kitchen buPROPion (WELLBUTRIN SR) 100 MG 12 hr tablet Take 100 mg by mouth 2 (two) times daily.     . hydrALAZINE (APRESOLINE) 50 MG tablet Take 50 mg by mouth 2 (two) times daily.    Marland Kitchen lisinopril-hydrochlorothiazide (PRINZIDE,ZESTORETIC) 20-12.5 MG per tablet Take 2 tablets by mouth daily. 180 tablet 3  . paliperidone (INVEGA) 6 MG 24 hr tablet Take 6 mg by mouth at bedtime.    . cyclobenzaprine (FLEXERIL) 10 MG tablet Take 1 tablet (10 mg total) by mouth 3 (three) times daily as needed for muscle spasms (or pain). (Patient not taking: Reported on 11/07/2015) 15 tablet 0  . hydrALAZINE (APRESOLINE) 100 MG tablet Take 1 tablet (100 mg total) by mouth 3 (three) times daily. (Patient not taking: Reported on 11/07/2015) 90 tablet 6  . HYDROcodone-acetaminophen (NORCO/VICODIN) 5-325 MG per tablet Take 1 tablet by mouth every 4 (four) hours as needed for moderate pain or severe pain. (Patient not taking: Reported on 11/07/2015) 15 tablet 0  . ibuprofen (ADVIL,MOTRIN) 800 MG tablet Take 1 tablet (800 mg total) by mouth 3 (three) times daily. (Patient not taking: Reported on 11/07/2015) 21 tablet 0    Allergies: No Known Allergies  ROS: As per HPI. A full 14-point review of systems was performed and is otherwise unremarkable.  PE:  BP (!) 180/139   Pulse (!) 55   Temp 97.8 F (36.6 C) (Oral)   Resp 15   SpO2 100%   General: WDWN, no acute distress. AAO x4. Speech clear, no dysarthria. No aphasia. Follows commands briskly. Affect is Flat. Comportment is normal.  HEENT: Normocephalic. Neck supple without LAD. MMM, OP clear. Dentition good. Sclerae anicteric. No conjunctival injection.  CV: Regular, no murmur. Carotid pulses full and symmetric, no bruits. Distal pulses 2+ and symmetric.  Lungs: CTAB.  Abdomen: Soft, non-distended, non-tender. Bowel sounds present x4.  Extremities: No C/C/E. Neuro:  CN: Pupils are equal and round. They are symmetrically reactive from 3-->2 mm. visual fields are full to confrontation. However, he is inconsistently inaccurate with the number of fingers that he  sees in both eyes, generally off by one each time. EOMI without nystagmus. No reported diplopia. Facial sensation is intact to light touch. Face is notable for mildly asymmetric smile on the right, that this seems to be somewhat effort dependent. Hearing is intact to conversational voice. Palate elevates symmetrically and uvula is midline. Voice is normal in tone, pitch and quality. Bilateral SCM and trapezii are 5/5. Tongue is midline with normal bulk and mobility.  Motor: Normal bulk and tone. Strength appears normal with the exception of 4+/5 strength in the left grip, left finger extensors, right wrist extensors, triceps, and deltoids. The patient grimaces during strength testing in the right upper extremity but denies actual pain, stating only that it feels "weird." No tremor or other abnormal movements. He has slight pronation of the extended right arm, no actual drift.  Sensation: Decreased to light touch on the right arm.  DTRs: 2+, symmetric. Toes downgoing bilaterally. No pathologic reflexes.  Coordination: Finger-to-nose and heel-to-shin are without dysmetria. Finger taps are slower on the right than the left.  Gait: Casual gait is slow but steady.   Labs:  Lab Results  Component Value Date   WBC 4.4 11/06/2015   HGB 15.6 11/06/2015   HCT 46.0 11/06/2015   PLT 161 11/06/2015   GLUCOSE 94 11/06/2015   CHOL 224 (H) 01/08/2013   TRIG 71 01/08/2013   HDL 47 01/08/2013   LDLCALC 163 (H) 01/08/2013   ALT 112 (H) 11/06/2015   AST 99 (H) 11/06/2015   NA 141 11/06/2015   K 3.7 11/06/2015   CL 103 11/06/2015   CREATININE 1.90 (H) 11/06/2015   BUN 18 11/06/2015   CO2 27 11/06/2015   TSH 1.593 04/19/2008   PSA 0.85 04/19/2008   INR 1.12 11/06/2015   MICROALBUR 2.35 (H) 04/19/2008   Troponin 0.03  Imaging:  I have personally and independently reviewed the CT scan of the head without contrast from 11/06/15. 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 Scattered  chronic small vessel ischemic changes noted in the bihemispheric white matter.  Assessment and Plan:  1. Possible stroke: His presentation is concerning for possible stroke given what appears to be an upper motor neuron pattern of weakness in the right arm. However, effort is somewhat variable with the examination.  He does have some risk factors for cerebrovascular disease, including hypertension, hyperlipidemia, tobacco be abuse, and strong family history of stroke.I recommended MRI scan of the brain for further evaluation. If this doesn't show stroke, then he can be admitted for further evaluation to include TTE, carotid Dopplers, fasting lipids, and hemoglobin A1c. Additional recommendations would depend upon MRI results. Regardless of the MRI scan, tobacco cessation is recommended.  2. Right-sided weakness: This is limited to the face and the arm, seemingly effort dependent on the examination. As noted above, this does raise concern for possibility of a stroke and MRI scan is pending.  3. Right eye vision change: On examination, he does not have a field cut of any kind. He has some difficulty with finger counting in both eyes, typically off by one each time. The significance of this is uncertain. He likely would benefit from an optometric evaluation.  4. Right upper extremity tingling: Again, this raises concern for possible stroke with MRI scan pending. History regarding this tingling is somewhat inconsistent and he initially stated that it was only present when he wakes up in the morning after sleeping on his side, suggesting that it is positional in nature. However, his wife asserts that it is present at other times as well and he is acknowledging some dysesthesias on his present examination. Further recommendations pending MRI scan results.  This was discussed with the patient and his wife. They are in agreement with the plan as noted. There were given the opportunity to ask any questions and  these were addressed to their satisfaction.  I also discussed my findings and recommendations with Dr. Adela LankFloyd, ED attending, at the time of my visit.

## 2015-11-07 NOTE — ED Notes (Signed)
Ok to eat, per Dr. Konrad DoloresMerrell

## 2015-11-07 NOTE — ED Notes (Signed)
Pt states he is leaving because his son has to get on bus for school in a few hours.  Informed pt that he is next to go to treatment room and encouraged him to wait to be seen.  Pt states he will wait.

## 2015-11-07 NOTE — ED Provider Notes (Signed)
[facility]-EMERGENCY DEPT Provider Note   CSN: [id number] Arrival date & time: 11/06/15  2011  By signing my name below, I, Suzan Slick. Elon Spanner, attest that this documentation has been prepared under the direction and in the presence of Melene Plan, DO.  Electronically Signed: Suzan Slick. Elon Spanner, ED Scribe. 11/07/15. 6:21 AM.    History   Chief Complaint Chief Complaint  Patient presents with  . Extremity Weakness   The history is provided by the patient. No language interpreter was used.    HPI Comments: Thomas [name] is a 50 y.o. male with a PMHx of HTN and hyperlipidemia who presents to the Emergency Department complaining of intermittent, unchanged visual changes to the R eye x 1 week. Pt states at times he is unable to see through his R eye. He describes visual field as "blurry". However, pt states occasionally his vision returns to baseline without difficulty. He denies any recent injury to eye. He also reports tingling to the R arm which extends from the R shoulder to the R hand which only presents when he is sleeping and just after waking. Wife has also noted right facial droop and states "he isn't normal". No aggravating or alleviating factors reported. No recent fever, chills, nausea, vomiting, or chest pain. No loss of sensation or numbness.  PCP: Dorrene German, MD    Past Medical History:  Diagnosis Date  . Abnormal EKG   . Cardiomegaly   . Family history of heart disease   . H/O medication noncompliance   . Hyperlipidemia   . Hypertension   . Tobacco abuse     Patient Active Problem List   Diagnosis Date Noted  . Hyperlipidemia 01/26/2014  . Chest pain, atypical 01/08/2013  . Dyspnea 07/07/2012  . Leg cramps 07/07/2012  . DYSURIA 09/28/2009  . TOBACCO ABUSE 04/19/2008  . KNEE PAIN, RIGHT 04/19/2008  . POLYURIA 04/19/2008  . HYPERTENSION, BENIGN ESSENTIAL 01/22/2005    Past Surgical History:  Procedure Laterality Date  . NM MYOCAR PERF WALL MOTION  06/28/2011   protocol Bruce, normal perfusion nin all regions, post stress EF 57%,, exercise cap  . TRANSTHORACIC ECHOCARDIOGRAM  06/28/2011   EF=55%, Proximal septal thickening, borderline LA enlargement, boarderline aortic root dialation       Home Medications    Prior to Admission medications   Medication Sig Start Date End Date Taking? Authorizing Provider  amLODipine (NORVASC) 10 MG tablet Take 1 tablet (10 mg total) by mouth daily. 08/27/13  Yes Brittainy Sherlynn Carbon, PA-C  aspirin EC 325 MG tablet Take 325 mg by mouth daily.   Yes Historical Provider, MD  buPROPion (WELLBUTRIN SR) 100 MG 12 hr tablet Take 100 mg by mouth 2 (two) times daily.   Yes Historical Provider, MD  hydrALAZINE (APRESOLINE) 50 MG tablet Take 50 mg by mouth 2 (two) times daily.   Yes Historical Provider, MD  lisinopril-hydrochlorothiazide (PRINZIDE,ZESTORETIC) 20-12.5 MG per tablet Take 2 tablets by mouth daily. 08/27/13  Yes Brittainy Sherlynn Carbon, PA-C  paliperidone (INVEGA) 6 MG 24 hr tablet Take 6 mg by mouth at bedtime.   Yes Historical Provider, MD  cyclobenzaprine (FLEXERIL) 10 MG tablet Take 1 tablet (10 mg total) by mouth 3 (three) times daily as needed for muscle spasms (or pain). Patient not taking: Reported on 11/07/2015 01/28/14   Trixie Dredge, PA-C  hydrALAZINE (APRESOLINE) 100 MG tablet Take 1 tablet (100 mg total) by mouth 3 (three) times daily. Patient not taking: Reported on 11/07/2015 09/11/13  Brittainy Sherlynn Carbon, PA-C  HYDROcodone-acetaminophen (NORCO/VICODIN) 5-325 MG per tablet Take 1 tablet by mouth every 4 (four) hours as needed for moderate pain or severe pain. Patient not taking: Reported on 11/07/2015 01/28/14   Trixie Dredge, PA-C  ibuprofen (ADVIL,MOTRIN) 800 MG tablet Take 1 tablet (800 mg total) by mouth 3 (three) times daily. Patient not taking: Reported on 11/07/2015 08/15/13   Roxy Horseman, PA-C    Family History Family History  Problem Relation Age of Onset  . Hypertension Mother   . Diabetes  Mother   . Stroke Mother   . Hypertension Father   . Diabetes Father   . Stroke Father   . Hypertension Sister   . Stroke Sister   . Diabetes Sister   . Hyperlipidemia Maternal Grandmother   . Diabetes Maternal Grandmother   . Asthma Daughter   . Asthma Son     Social History Social History  Substance Use Topics  . Smoking status: Current Some Day Smoker    Packs/day: 0.25    Years: 15.00    Types: Cigarettes    Last attempt to quit: 08/06/2013  . Smokeless tobacco: Never Used     Comment: quit in Juy 2015  . Alcohol use No     Comment: "off and on" - stopped 4 weeks ago (as of 08/27/13)     Allergies   Review of patient's allergies indicates no known allergies.   Review of Systems Review of Systems  Constitutional: Negative for chills and fever.  Eyes: Positive for visual disturbance.  Respiratory: Negative for shortness of breath.   Cardiovascular: Negative for chest pain.  Gastrointestinal: Negative for nausea and vomiting.  Neurological: Positive for facial asymmetry.  Psychiatric/Behavioral: Negative for confusion.  All other systems reviewed and are negative.    Physical Exam Updated Vital Signs BP (!) 180/139   Pulse (!) 55   Temp 97.8 F (36.6 C) (Oral)   Resp 15   SpO2 100%   Physical Exam  Constitutional: He is oriented to person, place, and time. He appears well-developed and well-nourished.  HENT:  Head: Normocephalic and atraumatic.  Eyes: EOM are normal. Pupils are equal, round, and reactive to light.  Neck: Normal range of motion. Neck supple. No JVD present.  Cardiovascular: Normal rate and regular rhythm.  Exam reveals no gallop and no friction rub.   No murmur heard. Pulmonary/Chest: No respiratory distress. He has no wheezes.  Abdominal: He exhibits no distension and no mass. There is no tenderness. There is no rebound and no guarding.  Musculoskeletal: Normal range of motion.  Neurological: He is alert and oriented to person, place,  and time.  R sided facial droop. R sided upper extremity strength 4/5. L sided upper extremity strength 5/5. R and L lower extremity strength both 5/5.  Skin: No rash noted. No pallor.  Psychiatric: He has a normal mood and affect. His behavior is normal.  Nursing note and vitals reviewed.    ED Treatments / Results   DIAGNOSTIC STUDIES: Oxygen Saturation is 100% on RA, Normal by my interpretation.    COORDINATION OF CARE: 3:15 AM- Will order blood work, EKG, and imaging. Discussed treatment plan with pt at bedside and pt agreed to plan.    Labs (all labs ordered are listed, but only abnormal results are displayed) Labs Reviewed  DIFFERENTIAL - Abnormal; Notable for the following:       Result Value   Neutro Abs 1.3 (*)    All other components within  normal limits  COMPREHENSIVE METABOLIC PANEL - Abnormal; Notable for the following:    Creatinine, Ser 1.81 (*)    AST 99 (*)    ALT 112 (*)    GFR calc non Af Amer 42 (*)    GFR calc Af Amer 49 (*)    All other components within normal limits  CBG MONITORING, ED - Abnormal; Notable for the following:    Glucose-Capillary 121 (*)    All other components within normal limits  I-STAT CHEM 8, ED - Abnormal; Notable for the following:    Creatinine, Ser 1.90 (*)    All other components within normal limits  PROTIME-INR  APTT  CBC  I-STAT TROPOININ, ED    EKG  EKG Interpretation  Date/Time:  Sunday November 06 2015 20:22:47 EDT Ventricular Rate:  57 PR Interval:  192 QRS Duration: 116 QT Interval:  438 QTC Calculation: 426 R Axis:   -34 Text Interpretation:  Sinus bradycardia Left axis deviation Left ventricular hypertrophy with QRS widening Nonspecific T wave abnormality Abnormal ECG biphasic t wave in lead III, aVF not seen in prior Otherwise no significant change Confirmed by Tomeca Helm MD, DANIEL [phone]) on 11/07/2015 2:43:47 AM       Radiology Ct Head Wo Contrast  Result Date: 11/06/2015 CLINICAL DATA:  Right arm  weakness. Right eye vision loss. Double vision. EXAM: CT HEAD WITHOUT CONTRAST TECHNIQUE: Contiguous axial images were obtained from the base of the skull through the vertex without intravenous contrast. COMPARISON:  None. FINDINGS: Brain: Ventricle size normal. Small hypodensity right cerebellum compatible with chronic infarction. Negative for acute infarct. Negative for hemorrhage or mass. Vascular: No hyperdense vessel or unexpected calcification. Skull: Negative Sinuses/Orbits: Retention cyst left maxillary sinus Other: Extensive periapical lucency around upper second molars bilaterally with caries. Dental evaluation recommended. IMPRESSION: Small chronic infarct right cerebellum. No acute intracranial abnormality. Dental disease. Electronically Signed   By: Marlan Palau M.D.   On: 11/06/2015 21:45    Procedures Procedures (including critical care time)  Medications Ordered in ED Medications - No data to display   Initial Impression / Assessment and Plan / ED Course  I have reviewed the triage vital signs and the nursing notes.  Pertinent labs & imaging results that were available during my care of the patient were reviewed by me and considered in my medical decision making (see chart for details).  Clinical Course    50 yo M With a chief complaint of right-sided paresthesias and right visual impairment. Patient feels that his right eye is blurry. He is unable to describe it further. He also has numbness and tingling in his right arm when he sleeps on it at night. On exam patient has right upper extremity weakness as well as right-sided facial droop the family says is new. Discussed with neurology, Dr. Roxy Manns. He recommended doing an MRI of the brain. If negative will discharge home and have him follow-up with outpatient ophthalmology and neurology.  The patients results and plan were reviewed and discussed.   Any x-rays performed were independently reviewed by myself.   Differential  diagnosis were considered with the presenting HPI.  Medications - No data to display  Vitals:   11/07/15 0111 11/07/15 0245 11/07/15 0300 11/07/15 0345  BP: (!) 191/116 (!) 171/119 (!) 175/112 (!) 180/139  Pulse: (!) 56 (!) 49 (!) 50 (!) 55  Resp: 16 14 12 15   Temp:      TempSrc:      SpO2: 100% 100% 99%  100%    Final diagnoses:  Facial droop  Paresthesia      Final Clinical Impressions(s) / ED Diagnoses   Final diagnoses:  Facial droop  Paresthesia    New Prescriptions New Prescriptions   No medications on file  I personally performed the services described in this documentation, which was scribed in my presence. The recorded information has been reviewed and is accurate.      Melene Planan Ahaana Rochette, DO 11/07/15 2313

## 2015-11-07 NOTE — ED Notes (Signed)
Tray ordered for pt.

## 2015-11-07 NOTE — ED Provider Notes (Signed)
Signed out by Dr Adela LankFloyd.  On review chart, patient has uncontrolled hypertension, and symptoms c/w cva.   From labs, also appears to have some AKI.   Will admit.    Cathren LaineKevin Brezlyn Manrique, MD 11/07/15 [phone]

## 2015-11-07 NOTE — H&P (Signed)
History and Physical    KHAALID LEFKOWITZ WHQ:[id number] DOB: Mar 03, 1965 DOA: 11/07/2015   PCP: Philis Fendt, MD   Patient coming from:  Home   Chief Complaint: R Blurred vision, Right facial asymmetry, Right arm tingling   HPI: Thomas [name] is a 50 y.o. male with medical history significant for HTN, HLD, 1 ppd tobacco abuse, bipolar disorder, presenting with 2 week history of intermittent  R Blurred vision, Right facial asymmetry, Right arm tingling , worse over the last 48 hrs. Denies any history of TIA. Denies vertigo dizziness or vision changes. Denies headaches, dysarthria or dysphagia. No confusion or seizures. Denies any chest pain, or shortness of breath. Denies any fever or chills, or night sweats. No new meds or hormonal supplements. Does take a regular ASA a day, with no other antiplatelets or anticoagulants. He is not compliant with meds. Denies any recent long distance trips or recent surgeries. No sick contacts. No new stressors present in personal life. He partakes marijuana, denies cocaine  He is not a diabetic. No family history of stroke. Patient was not administered TPA as is beyond time window for treatment consideration. Will admit for further evaluation and treatment.  ED Course:  BP (!) 191/125   Pulse (!) 48   Temp 97.8 F (36.6 C) (Oral)   Resp 14   SpO2 95%    Tn 0.03, normal CBC, Cr 1.81 AST/ALT 99/112 EGFR 49 Glu 121 EKG Sinus Brady LVH, known findings MRI brain : chronic cerebellar infarct, Punctate acute midbrain infarct    Review of Systems: As per HPI otherwise 10 point review of systems negative.   Past Medical History:  Diagnosis Date  . Abnormal EKG   . Cardiomegaly   . Family history of heart disease   . H/O medication noncompliance   . Hyperlipidemia   . Hypertension   . Tobacco abuse     Past Surgical History:  Procedure Laterality Date  . NM MYOCAR PERF WALL MOTION  06/28/2011   protocol Bruce, normal perfusion nin all regions,  post stress EF 57%,, exercise cap 13METS  . TRANSTHORACIC ECHOCARDIOGRAM  06/28/2011   EF=55%, Proximal septal thickening, borderline LA enlargement, boarderline aortic root dialation    Social History Social History   Social History  . Marital status: Married    Spouse name: N/A  . Number of children: N/A  . Years of education: N/A   Occupational History  . Not on file.   Social History Main Topics  . Smoking status: Current Some Day Smoker    Packs/day: 0.25    Years: 15.00    Types: Cigarettes    Last attempt to quit: 08/06/2013  . Smokeless tobacco: Never Used     Comment: quit in Juy 2015  . Alcohol use No     Comment: "off and on" - stopped 4 weeks ago (as of 08/27/13)  . Drug use: No  . Sexual activity: Not on file   Other Topics Concern  . Not on file   Social History Narrative  . No narrative on file     No Known Allergies  Family History  Problem Relation Age of Onset  . Hypertension Mother   . Diabetes Mother   . Stroke Mother   . Hypertension Father   . Diabetes Father   . Stroke Father   . Hypertension Sister   . Stroke Sister   . Diabetes Sister   . Hyperlipidemia Maternal Grandmother   . Diabetes Maternal Grandmother   .  Asthma Daughter   . Asthma Son       Prior to Admission medications   Medication Sig Start Date End Date Taking? Authorizing Provider  amLODipine (NORVASC) 10 MG tablet Take 1 tablet (10 mg total) by mouth daily. 08/27/13  Yes Brittainy Erie Noe, PA-C  aspirin EC 325 MG tablet Take 325 mg by mouth daily.   Yes Historical Provider, MD  buPROPion (WELLBUTRIN SR) 100 MG 12 hr tablet Take 100 mg by mouth 2 (two) times daily.   Yes Historical Provider, MD  hydrALAZINE (APRESOLINE) 50 MG tablet Take 50 mg by mouth 2 (two) times daily.   Yes Historical Provider, MD  lisinopril-hydrochlorothiazide (PRINZIDE,ZESTORETIC) 20-12.5 MG per tablet Take 2 tablets by mouth daily. 08/27/13  Yes Brittainy Erie Noe, PA-C  paliperidone (INVEGA) 6  MG 24 hr tablet Take 6 mg by mouth at bedtime.   Yes Historical Provider, MD  cyclobenzaprine (FLEXERIL) 10 MG tablet Take 1 tablet (10 mg total) by mouth 3 (three) times daily as needed for muscle spasms (or pain). Patient not taking: Reported on 11/07/2015 01/28/14   Clayton Bibles, PA-C  hydrALAZINE (APRESOLINE) 100 MG tablet Take 1 tablet (100 mg total) by mouth 3 (three) times daily. Patient not taking: Reported on 11/07/2015 09/11/13   Brittainy Erie Noe, PA-C  HYDROcodone-acetaminophen (NORCO/VICODIN) 5-325 MG per tablet Take 1 tablet by mouth every 4 (four) hours as needed for moderate pain or severe pain. Patient not taking: Reported on 11/07/2015 01/28/14   Clayton Bibles, PA-C  ibuprofen (ADVIL,MOTRIN) 800 MG tablet Take 1 tablet (800 mg total) by mouth 3 (three) times daily. Patient not taking: Reported on 11/07/2015 08/15/13   Montine Circle, PA-C    Physical Exam:    Vitals:   11/07/15 0530 11/07/15 0600 11/07/15 0745 11/07/15 0830  BP: (!) 172/129 (!) 163/122 (!) 174/115 (!) 191/125  Pulse: (!) 56 (!) 55 (!) 52 (!) 48  Resp:   11 14  Temp:      TempSrc:      SpO2: 97% 98% 95% 95%       Constitutional: NAD, calm, comfortable   Vitals:   11/07/15 0530 11/07/15 0600 11/07/15 0745 11/07/15 0830  BP: (!) 172/129 (!) 163/122 (!) 174/115 (!) 191/125  Pulse: (!) 56 (!) 55 (!) 52 (!) 48  Resp:   11 14  Temp:      TempSrc:      SpO2: 97% 98% 95% 95%   Eyes: PERRL, lids and conjunctivae normal ENMT: Mucous membranes are moist. Posterior pharynx clear of any exudate or lesions.Normal dentition.  Neck: normal, supple, no masses, no thyromegaly Respiratory: clear to auscultation bilaterally, no wheezing, no crackles. Normal respiratory effort. No accessory muscle use.  Cardiovascular: Loletha Grayer rate and rhythm, 2/6 systolic murmurs / rubs / gallops. No extremity edema. 2+ pedal pulses. No carotid bruits.  Abdomen: no tenderness, no masses palpated. No hepatosplenomegaly. Bowel sounds  positive.  Musculoskeletal: no clubbing / cyanosis. No joint deformity upper and lower extremities. Good ROM, no contractures. Normal muscle tone.  Skin: no rashes, lesions, ulcers.  Neurologic: Left arm 4/5 strength, decreased sensation on R arm, asymmetrical smile, but unclear droop.  Psychiatric: Normal judgment and insight. Alert and oriented x 3.Depressed mood. Flat affect  History of bipolar disorder      Labs on Admission: I have personally reviewed following labs and imaging studies  CBC:  Recent Labs Lab 11/06/15 2023 11/06/15 2036  WBC 4.4  --   NEUTROABS 1.3*  --  HGB 15.1 15.6  HCT 43.5 46.0  MCV 86.7  --   PLT 161  --     Basic Metabolic Panel:  Recent Labs Lab 11/06/15 2023 11/06/15 2036  NA 139 141  K 3.6 3.7  CL 104 103  CO2 27  --   GLUCOSE 96 94  BUN 16 18  CREATININE 1.81* 1.90*  CALCIUM 9.7  --     GFR: CrCl cannot be calculated (Unknown ideal weight.).  Liver Function Tests:  Recent Labs Lab 11/06/15 2023  AST 99*  ALT 112*  ALKPHOS 77  BILITOT 0.9  PROT 7.3  ALBUMIN 3.8   No results for input(s): LIPASE, AMYLASE in the last 168 hours. No results for input(s): AMMONIA in the last 168 hours.  Coagulation Profile:  Recent Labs Lab 11/06/15 2023  INR 1.12    Cardiac Enzymes: No results for input(s): CKTOTAL, CKMB, CKMBINDEX, TROPONINI in the last 168 hours.  BNP (last 3 results) No results for input(s): PROBNP in the last 8760 hours.  HbA1C: No results for input(s): HGBA1C in the last 72 hours.  CBG:  Recent Labs Lab 11/06/15 2027  GLUCAP 121*    Lipid Profile: No results for input(s): CHOL, HDL, LDLCALC, TRIG, CHOLHDL, LDLDIRECT in the last 72 hours.  Thyroid Function Tests: No results for input(s): TSH, T4TOTAL, FREET4, T3FREE, THYROIDAB in the last 72 hours.  Anemia Panel: No results for input(s): VITAMINB12, FOLATE, FERRITIN, TIBC, IRON, RETICCTPCT in the last 72 hours.  Urine analysis:    Component  Value Date/Time   COLORURINE brown 09/28/2009 0923   APPEARANCEUR Hazy 09/28/2009 0923   LABSPEC >=1.030 09/28/2009 0923   PHURINE 5.5 09/28/2009 0923   HGBUR negative 09/28/2009 0923   BILIRUBINUR small 09/28/2009 0923   UROBILINOGEN 2.0 09/28/2009 0923   NITRITE negative 09/28/2009 0923    Sepsis Labs: _0 (procalcitonin:4,lacticidven:4) )No results found for this or any previous visit (from the past 240 hour(s)).   Radiological Exams on Admission: Ct Head Wo Contrast  Result Date: 11/06/2015 CLINICAL DATA:  Right arm weakness. Right eye vision loss. Double vision. EXAM: CT HEAD WITHOUT CONTRAST TECHNIQUE: Contiguous axial images were obtained from the base of the skull through the vertex without intravenous contrast. COMPARISON:  None. FINDINGS: Brain: Ventricle size normal. Small hypodensity right cerebellum compatible with chronic infarction. Negative for acute infarct. Negative for hemorrhage or mass. Vascular: No hyperdense vessel or unexpected calcification. Skull: Negative Sinuses/Orbits: Retention cyst left maxillary sinus Other: Extensive periapical lucency around upper second molars bilaterally with caries. Dental evaluation recommended. IMPRESSION: Small chronic infarct right cerebellum. No acute intracranial abnormality. Dental disease. Electronically Signed   By: Franchot Gallo M.D.   On: 11/06/2015 21:45   Mr Brain Wo Contrast  Result Date: 11/07/2015 CLINICAL DATA:  Stroke-like symptoms. Right eye blurry vision, right arm tingling, and weakness in the right face and arm. EXAM: MRI HEAD WITHOUT CONTRAST TECHNIQUE: Multiplanar, multiecho pulse sequences of the brain and surrounding structures were obtained without intravenous contrast. COMPARISON:  Head CT 11/06/2015 FINDINGS: Brain: There is a 3 mm focus of restricted diffusion in the upper midbrain just right of midline adjacent to the cerebral aqueduct. There are small chronic infarcts in the cerebellum bilaterally.  The ventricles and sulci are normal in size. No intracranial hemorrhage, mass, midline shift, or extra-axial fluid collection is identified. A few foci of T2 hyperintensity in the cerebral white matter are nonspecific but may reflect early chronic small vessel ischemic disease. Vascular: Major intracranial vascular flow voids  are preserved. Skull and upper cervical spine: Unremarkable bone marrow signal. Sinuses/Orbits: Unremarkable orbits. Left maxillary sinus mucous retention cyst. Clear mastoid air cells. Other: None. IMPRESSION: 1. Punctate acute midbrain infarct. 2. Chronic cerebellar infarcts. Electronically Signed   By: Logan Bores M.D.   On: 11/07/2015 07:53    EKG: Independently reviewed.  Assessment/Plan Active Problems:   Acute cerebrovascular accident (CVA) ([place])   Uncontrolled hypertension   TOBACCO ABUSE   Hyperlipidemia   Blurred vision, right eye   Chronic sinus bradycardia   AKI (acute kidney injury) ([place])   Acute CVA as evidenced by Punctate acute midbrain infarct in addition to  chronic cerebellar infarct , with  intermittent  R Blurred vision, Right facial asymmetry, Right arm tingling. Not a TPA candidate as his last known normal about 2 weeks ago. Risk facors: malignant hypertension, Tobacco abuse, family history of stroke - Admit to [place] / Inpatient Stroke order set MRA neck Resume home BP meds as stroke has happened beyond the window of permissive hypertension window. -Echo  -PT/OT/SLP  lipid panel  Check A1C -Aspirin  UDS Anticoagulation  Appreciate Neuro input    Uncontrolled Hypertension  Patient non compliant with  medications. No confusion or headaches. Received Hydralazine 20 mg  x1 at the ED for BP  191/125  Hydralazine 5-10 mg IV q 6 hrs prn for BP 210/110  While on permissive hypertension Consider workup for secondary hypertension as outpatient  Chronic Bradycardia. EKG today without significant changes.Tn less than 0.03. Seen in the past by Dr.  Gwenlyn Found. No chest pain or palpitations Monitor closely    AKI in the setting of uncontrolled hypertension   baseline creatinine 1.3    Current Cr 1.9 Lab Results  Component Value Date   CREATININE 1.90 (H) 11/06/2015   CREATININE 1.81 (H) 11/06/2015   CREATININE 1.38 (H) 08/14/2013   IVF Hold diuretics  Repeat CMET in am   Hyperlipidemia Not on  home statins Check Lipid panel   Tobacco abuse without  nicotine withdrawal at this time -  Nicotine patch vs gum prn -  Counseled cessation   Bipolar Disorder Continue Wellbutrin and Invega  DVT prophylaxis: Heparin sq Code Status:   Full     Family Communication:  Discussed with patient  Disposition Plan: Expect patient to be discharged to home after condition improves Consults called:   Neuro  Admission status:Tele  Inpatient    [facility] E, PA-C [facility]   If 7PM-7AM, please contact night-coverage [email or web address] Password [email or web address]  11/07/2015, 9:05 AM

## 2015-11-07 NOTE — ED Notes (Signed)
Pt taken to MRI  

## 2015-11-07 NOTE — ED Notes (Signed)
Clydie BraunKaren RN made aware of elevated BP

## 2015-11-08 ENCOUNTER — Encounter: Payer: Self-pay | Admitting: *Deleted

## 2015-11-08 DIAGNOSIS — I10 Essential (primary) hypertension: Secondary | ICD-10-CM

## 2015-11-08 DIAGNOSIS — F17200 Nicotine dependence, unspecified, uncomplicated: Secondary | ICD-10-CM

## 2015-11-08 DIAGNOSIS — E784 Other hyperlipidemia: Secondary | ICD-10-CM

## 2015-11-08 DIAGNOSIS — I639 Cerebral infarction, unspecified: Secondary | ICD-10-CM | POA: Diagnosis present

## 2015-11-08 DIAGNOSIS — Z006 Encounter for examination for normal comparison and control in clinical research program: Secondary | ICD-10-CM

## 2015-11-08 DIAGNOSIS — H538 Other visual disturbances: Secondary | ICD-10-CM | POA: Diagnosis present

## 2015-11-08 LAB — CBC
HCT: 45.6 % (ref 39.0–52.0)
Hemoglobin: 16 g/dL (ref 13.0–17.0)
MCH: 30 pg (ref 26.0–34.0)
MCHC: 35.1 g/dL (ref 30.0–36.0)
MCV: 85.6 fL (ref 78.0–100.0)
PLATELETS: 160 10*3/uL (ref 150–400)
RBC: 5.33 MIL/uL (ref 4.22–5.81)
RDW: 14.6 % (ref 11.5–15.5)
WBC: 4.4 10*3/uL (ref 4.0–10.5)

## 2015-11-08 LAB — COMPREHENSIVE METABOLIC PANEL
ALBUMIN: 3.9 g/dL (ref 3.5–5.0)
ALT: 128 U/L — ABNORMAL HIGH (ref 17–63)
AST: 100 U/L — AB (ref 15–41)
Alkaline Phosphatase: 78 U/L (ref 38–126)
Anion gap: 7 (ref 5–15)
BUN: 13 mg/dL (ref 6–20)
CALCIUM: 9.4 mg/dL (ref 8.9–10.3)
CO2: 25 mmol/L (ref 22–32)
CREATININE: 1.56 mg/dL — AB (ref 0.61–1.24)
Chloride: 105 mmol/L (ref 101–111)
GFR calc non Af Amer: 51 mL/min — ABNORMAL LOW (ref >60)
GFR, EST AFRICAN AMERICAN: 59 mL/min — AB (ref >60)
GLUCOSE: 91 mg/dL (ref 65–99)
Potassium: 3.2 mmol/L — ABNORMAL LOW (ref 3.5–5.1)
Sodium: 137 mmol/L (ref 135–145)
TOTAL PROTEIN: 7.8 g/dL (ref 6.5–8.1)
Total Bilirubin: 1 mg/dL (ref 0.3–1.2)

## 2015-11-08 LAB — TROPONIN I
TROPONIN I: 0.04 ng/mL — AB (ref <0.03)
TROPONIN I: 0.04 ng/mL — AB (ref <0.03)
Troponin I: 0.04 ng/mL (ref <0.03)
Troponin I: 0.04 ng/mL (ref <0.03)

## 2015-11-08 LAB — LIPID PANEL
CHOLESTEROL: 196 mg/dL (ref 0–200)
HDL: 51 mg/dL (ref >40)
LDL CALC: 130 mg/dL — AB (ref 0–99)
Total CHOL/HDL Ratio: 3.8 RATIO
Triglycerides: 75 mg/dL (ref <150)
VLDL: 15 mg/dL (ref 0–40)

## 2015-11-08 MED ORDER — ATORVASTATIN CALCIUM 10 MG PO TABS
10.0000 mg | ORAL_TABLET | Freq: Every day | ORAL | Status: DC
  Administered 2015-11-08 – 2015-11-09 (×2): 10 mg via ORAL
  Filled 2015-11-08 (×2): qty 1

## 2015-11-08 MED ORDER — POTASSIUM CHLORIDE CRYS ER 20 MEQ PO TBCR
40.0000 meq | EXTENDED_RELEASE_TABLET | Freq: Once | ORAL | Status: AC
  Administered 2015-11-08: 40 meq via ORAL
  Filled 2015-11-08: qty 2

## 2015-11-08 NOTE — Progress Notes (Signed)
STROKE TEAM PROGRESS NOTE   HISTORY OF PRESENT ILLNESS (per record)  This is a 50 year old right-handed man who presents to the emergency department at the urging of his wife for evaluation of various complaints including blurry vision, tingling in the right arm, and weakness in the right face and arm. History obtained from the patient. His wife is present and offers additional information as needed.  The patient initially states that he has had some weakness in the right arm and face for the past 2 days. He is unable to pinpoint exactly when this started and it is not clear that he ever really experienced the abrupt onset of weakness. However, he does feel as if this has gotten worse over the past 24 hours. 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 In addition, he states that he has had some numbness and tingling in the right arm. However, he says that this is only present when he first wakes up in the morning. With regards to his vision complaints, he says he has blurry vision in his right eye and says this is been present for the past week. His wife believes that all of these symptoms have been present for more than a week but she to feels that they've gotten worse over the past 24-48 hours. This is what prompted her to bring him to the emergency department for further evaluation.   Patient was not administered IV t-PA secondary to delay in arrival. He was admitted for further evaluation and treatment.   SUBJECTIVE (INTERVAL HISTORY) His wife is at the bedside.  Overall he feels his condition is unchanged. He still has double vision.    OBJECTIVE Temp:  [97.5 F (36.4 C)-98.7 F (37.1 C)] 97.5 F (36.4 C) (10/17 1023) Pulse Rate:  [53-71] 71 (10/17 1023) Cardiac Rhythm: Normal sinus rhythm;Bundle branch block (10/17 0700) Resp:  [16-18] 16 (10/17 0046) BP:  (143-176)/(92-117) 143/95 (10/17 1023) SpO2:  [98 %-100 %] 98 % (10/17 1023) Weight:  [80 kg (176 lb 4.8 oz)] 80 kg (176 lb 4.8 oz) (10/16 1236)  CBC:  Recent Labs Lab 11/06/15 2023 11/06/15 2036 11/08/15 0224  WBC 4.4  --  4.4  NEUTROABS 1.3*  --   --   HGB 15.1 15.6 16.0  HCT 43.5 46.0 45.6  MCV 86.7  --  85.6  PLT 161  --  160    Basic Metabolic Panel:  Recent Labs Lab 11/06/15 2023 11/06/15 2036 11/08/15 0224  NA 139 141 137  K 3.6 3.7 3.2*  CL 104 103 105  CO2 27  --  25  GLUCOSE 96 94 91  BUN 16 18 13   CREATININE 1.81* 1.90* 1.56*  CALCIUM 9.7  --  9.4    Lipid Panel:    Component Value Date/Time   CHOL 196 11/08/2015 0224   TRIG 75 11/08/2015 0224   HDL 51 11/08/2015 0224   CHOLHDL 3.8 11/08/2015 0224   VLDL 15 11/08/2015 0224   LDLCALC 130 (H) 11/08/2015 0224   HgbA1c: No results found for: HGBA1C Urine Drug Screen:    Component Value Date/Time   LABOPIA NONE DETECTED 11/07/2015 2102   COCAINSCRNUR NONE DETECTED 11/07/2015 2102   LABBENZ NONE DETECTED 11/07/2015 2102   AMPHETMU NONE DETECTED 11/07/2015 2102   THCU POSITIVE (A) 11/07/2015 2102   LABBARB NONE DETECTED 11/07/2015 2102  IMAGING  Ct Head Wo Contrast 11/06/2015 Small chronic infarct right cerebellum. No acute intracranial abnormality. Dental disease.   Mr Brain Wo Contrast 11/07/2015 1. Punctate acute midbrain infarct. 2. Chronic cerebellar infarcts.   Mr Maxine GlennMra Head/brain ZOWo Cm Mr Maxine GlennMra Neck W Wo Contrast 11/07/2015 Normal head and neck MRA. Mild posterior circulation normal anatomic variation.    PHYSICAL EXAM Pleasant middle aged frail african [place] male not in distress. . Afebrile. Head is nontraumatic. Neck is supple without bruit.    Cardiac exam no murmur or gallop. Lungs are clear to auscultation. Distal pulses are well felt. Neurological Exam ;  Awake  Alert oriented x 3. Normal speech and language.eye movements full without nystagmus.fundi were not visualized.  Vision acuity and fields appear normal. Hearing is normal. Palatal movements are normal. Face symmetric. Tongue midline. Normal strength, tone, reflexes and coordination. Normal sensation. Gait deferred.  ASSESSMENT/PLAN Mr. Nada LibmanSteven L Leavy is a 50 y.o. male with history of HTN, HLD, tobacco use, bipolar d/o presenting with R arm and face weakness x 2 days. He did not receive IV t-PA due to delay in arrival.    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 Old cerebellar infarcts  MRA head and neck  unremarkable  2D Echo  pending   LDL 130  HgbA1c pending  Heparin 5000 units sq tid for VTE prophylaxis  Diet Heart Room service appropriate? Yes; Fluid consistency: Thin  No antithrombotic prior to admission, now on aspirin 325 mg daily  Patient counseled to be compliant with his antithrombotic medications  Ongoing aggressive stroke risk factor management  Interested in considering Stroke AF trial.  [facility] will follow up with patient  Therapy recommendations:  No therapy  Disposition:  pending   Okay for discharge once stroke workup completed  Follow up [facility] in 6 weeks  Hypertensive Urgency  BP as high as 191/125 in setting of neurologic symptoms  Stable now  Permissive hypertension (OK if < 220/120) but gradually normalize in 5-7 days  Long-term BP goal normotensive  Hyperlipidemia  Home meds:  No statin  LDL 130, goal < 70  Now on lipitor 10 g daily  Continue statin at discharge  Other Stroke Risk Factors  Cigarette smoker, advised to stop smoking  ETOH use, advised to drink no more than 2 drink(s) a day  THC use, UDS positive this admission  Family hx stroke (mother, father, sister)  Other Active Problems  Bipolar, on disability for   Hypokalemia  Acute renal failure superimposed on CKD stage III  Hospital day # 1  BIBY,SHARON  [facility] See Amion for Pager information 11/08/2015 4:40 PM  I have personally examined this patient, reviewed notes, independently viewed imaging studies, participated in medical decision making and plan of care.ROS completed by me personally and pertinent positives fully documented  I have made any additions or clarifications directly to the above note. Agree with note above. He has vision difficulties likely secondary to his small paramedian mid brain infarct due to small vessel disease. He remains at risk for recurrent stroke, TIA needs ongoing stroke evaluation. Start aspirin. Patient counseled to quit marijuana. Patient may consider possible participation in the stroke atrial fibrillation trial if interested. Greater than 50% time during this 35 minute visit was spent on counseling and coordination of care about stroke risk, prevention and treatment  Delia HeadyPramod Radford Pease, MD Medical Director [facility]  Pager: [phone] 11/08/2015 5:15 PM  To contact Stroke Continuity provider, please refer to [email or web address]. After hours, contact General Neurology

## 2015-11-08 NOTE — Evaluation (Signed)
Occupational Therapy Evaluation Patient Details Name: Nada LibmanSteven L Rickel MRN: [id number] DOB: 08/19/1965 Today's Date: 11/08/2015    History of Present Illness pt is a 50 y/o male with h/o HTN, tobacco abuse bipolar d/o, presenting with 2 week h/o intermittent R blurred vision, right facial asymmetry , and right arm tingling.  MRI reveals punctate acute midbrain infarct and chronic cerebellar infarcts.   Clinical Impression   PT admitted with CVA midbrain infarct and chronic cerebellar infarcts.. Pt currently with functional limitiations due to the deficits listed below (see OT problem list). PTA was independent with all adls.  Pt will benefit from skilled OT to increase their independence and safety with adls and balance to allow discharge home without follow up. Ot to follow acutely for visual assessment further. Recommend follow up with eye doctor of patients choice upon d/c due to blurred vision.     Follow Up Recommendations  No OT follow up    Equipment Recommendations  None recommended by OT    Recommendations for Other Services       Precautions / Restrictions Precautions Precautions: None      Mobility Bed Mobility Overal bed mobility: Independent                Transfers Overall transfer level: Modified independent                    Balance                                            ADL Overall ADL's : Needs assistance/impaired     Grooming: Wash/dry hands;Supervision/safety;Standing               Lower Body Dressing: Supervision/safety;Sit to/from stand Lower Body Dressing Details (indicate cue type and reason): don shoes Toilet Transfer: Min guard           Functional mobility during ADLs: Min guard General ADL Comments: Pt with wife and son present in room. Son asleeping in chair with wife on arrival . wife reports son had seizure and was in the ED 11/07/15 due to stress of father being acutely admitted.  Family provided additional recliner so that son could sleep more comfortably. Pt reports "i dont feel exactly right. My vision is different. its blurry"     Vision Vision Assessment?: Vision impaired- to be further tested in functional context Additional Comments: Pt able to read signs but when reading L side pt occluding vision of L eye to read. Pt reports its just blurry. Pt states "can you get me an eye doctor appointment? i will pay for it > i just need to see someone"    Perception     Praxis      Pertinent Vitals/Pain Pain Assessment: No/denies pain     Hand Dominance Right   Extremity/Trunk Assessment Upper Extremity Assessment Upper Extremity Assessment: Generalized weakness   Lower Extremity Assessment Lower Extremity Assessment: Defer to PT evaluation   Cervical / Trunk Assessment Cervical / Trunk Assessment: Normal   Communication Communication Communication: No difficulties   Cognition Arousal/Alertness: Awake/alert Behavior During Therapy: WFL for tasks assessed/performed Overall Cognitive Status: Within Functional Limits for tasks assessed                     General Comments       Exercises  Shoulder Instructions      Home Living Family/patient expects to be discharged to:: Private residence Living Arrangements: Spouse/significant other;Children Available Help at Discharge: Available PRN/intermittently;Family Type of Home: House Home Access: Stairs to enter [employer] of Steps: 2 Entrance Stairs-Rails: Right;Left Home Layout: One level     Bathroom Shower/Tub: Chief Strategy Officer: Standard     Home Equipment: Gilmer Mor - single point   Additional Comments: disabled for one year so not working      Prior Functioning/Environment Level of Independence: Independent                 OT Problem List: Decreased strength;Decreased activity tolerance;Impaired balance (sitting and/or standing);Impaired  vision/perception;Decreased safety awareness;Decreased knowledge of use of DME or AE;Decreased knowledge of precautions   OT Treatment/Interventions: Self-care/ADL training;Therapeutic exercise;DME and/or AE instruction;Therapeutic activities;Visual/perceptual remediation/compensation;Patient/family education;Balance training    OT Goals(Current goals can be found in the care plan section) Acute Rehab OT Goals Patient Stated Goal: to be able to reutrn home OT Goal Formulation: With patient Time For Goal Achievement: 11/22/15 Potential to Achieve Goals: Good  OT Frequency: Min 2X/week   Barriers to D/C:            Co-evaluation PT/OT/SLP Co-Evaluation/Treatment: Yes Reason for Co-Treatment: Complexity of the patient's impairments (multi-system involvement);For patient/therapist safety   OT goals addressed during session: ADL's and self-care;Strengthening/ROM      End of Session Equipment Utilized During Treatment: Gait belt Nurse Communication: Mobility status;Precautions  Activity Tolerance: Patient tolerated treatment well Patient left: in bed;with call bell/phone within reach;with family/visitor present   Time: 1610-9604 OT Time Calculation (min): 31 min Charges:  OT Evaluation $OT Eval Moderate Complexity: 1 Procedure G-Codes:    Boone Master B 2015-11-23, 2:34 PM   Mateo Flow   OTR/L Pager: [phone] Office: [phone] .

## 2015-11-08 NOTE — Progress Notes (Addendum)
Patient ID: TUFF CLABO, male   DOB: 1965/02/24, 50 y.o.   MRN: [id number]  PROGRESS NOTE    NASIM HABEEB  [id number] DOB: Mar 22, 1965 DOA: 11/07/2015  PCP: Dorrene German, MD   Brief Narrative:  87 -year-old male with past medical history significant for hypertension, dyslipidemia, tobacco use, bipolar disorder who presented to [facility] for evaluation of right blurry vision, right facial asymmetry, right arm tingling and symptoms overall worsening in past 48 hours prior to this admission. On this admission, patient was found to have acute midbrain infarct. Neurology will see in consultation.   Assessment & Plan:   Principal Problem:   Acute cerebrovascular accident (CVA) (HCC) / Blurry vision, right eye / right facial droop  Stroke work up initiated:  - Aspirin daily - MRI brain / MRA brain - acute punctate midbrain infarct. Normal MRA - 2D ECHO - pending  - Carotid doppler - pending  - HgbA1c - pending  , Lipid panel - LDL 130. LDL goal < 100. We'll start Lipitor 10 mg daily. - Diet: Heart healthy - Therapy: PT/OT - pending Other Stroke Risk Factors : Smoking, hypertension - Appreciate neurology following   Active Problems:   TOBACCO ABUSE - Counseled on smoking cessation - UDS positive for THC     HYPERTENSION, BENIGN ESSENTIAL - Continue Norvasc, he drowsy, HCTZ, lisinopril    Hyperlipidemia - Started atorvastatin 10 mg at bedtime - LDL 130    Hypokalemia - Supplemented     Acute renal failure superimposed on Chronic kidney disease stage III - Baseline creatinine 1.46 in June 2014 - Creatinine 1.9 on this admission, slightly above the baseline - Cr at baseline range this am   DVT prophylaxis: Heparin subQ  Code Status: full code  Family Communication: family at the bedside this am  Disposition Plan: home once stroke work up completed   Consultants:   Neurology   Procedures:   2 D ECHO pending as of 10/17  Antimicrobials:    None    Subjective: No overnight events.   Objective: Vitals:   11/07/15 1850 11/07/15 2030 11/07/15 2222 11/08/15 0046  BP: (!) 152/101 (!) 170/113 (!) 168/117 (!) 158/92  Pulse: 63 69 63 69  Resp: 18 16  16   Temp: 98.7 F (37.1 C) 98.5 F (36.9 C) 98.1 F (36.7 C) 98.6 F (37 C)  TempSrc: Oral Oral Oral Oral  SpO2: 99% 100% 99% 99%  Weight:      Height:        Intake/Output Summary (Last 24 hours) at 11/08/15 0952 Last data filed at 11/08/15 0700  Gross per 24 hour  Intake             1170 ml  Output                0 ml  Net             1170 ml   Filed Weights   11/07/15 1236  Weight: 80 kg (176 lb 4.8 oz)    Examination:  General exam: Appears calm and comfortable  Respiratory system: Clear to auscultation. Respiratory effort normal. Cardiovascular system: S1 & S2 heard, RRR. No JVD, murmurs, rubs, gallops or clicks. No pedal edema. Gastrointestinal system: Abdomen is nondistended, soft and nontender. No organomegaly or masses felt. Normal bowel sounds heard. Central nervous system: Alert and oriented. Still with right blurry vision but feels it's better, rigth arm tingling better Extremities: Symmetric 5 x 5 power. Skin:  No rashes, lesions or ulcers Psychiatry: Judgement and insight appear normal. Mood & affect appropriate.   Data Reviewed: I have personally reviewed following labs and imaging studies  CBC:  Recent Labs Lab 11/06/15 2023 11/06/15 2036 11/08/15 0224  WBC 4.4  --  4.4  NEUTROABS 1.3*  --   --   HGB 15.1 15.6 16.0  HCT 43.5 46.0 45.6  MCV 86.7  --  85.6  PLT 161  --  160   Basic Metabolic Panel:  Recent Labs Lab 11/06/15 2023 11/06/15 2036 11/08/15 0224  NA 139 141 137  K 3.6 3.7 3.2*  CL 104 103 105  CO2 27  --  25  GLUCOSE 96 94 91  BUN 16 18 13   CREATININE 1.81* 1.90* 1.56*  CALCIUM 9.7  --  9.4   GFR: Estimated Creatinine Clearance: 64.8 mL/min (by C-G formula based on SCr of 1.56 mg/dL (H)). Liver Function  Tests:  Recent Labs Lab 11/06/15 2023 11/08/15 0224  AST 99* 100*  ALT 112* 128*  ALKPHOS 77 78  BILITOT 0.9 1.0  PROT 7.3 7.8  ALBUMIN 3.8 3.9   No results for input(s): LIPASE, AMYLASE in the last 168 hours. No results for input(s): AMMONIA in the last 168 hours. Coagulation Profile:  Recent Labs Lab 11/06/15 2023 11/07/15 0950  INR 1.12 1.08   Cardiac Enzymes:  Recent Labs Lab 11/07/15 0950 11/07/15 1707 11/07/15 2115 11/08/15 0224  TROPONINI 0.04* 0.04* 0.04* 0.04*   BNP (last 3 results) No results for input(s): PROBNP in the last 8760 hours. HbA1C: No results for input(s): HGBA1C in the last 72 hours. CBG:  Recent Labs Lab 11/06/15 2027  GLUCAP 121*   Lipid Profile:  Recent Labs  11/08/15 0224  CHOL 196  HDL 51  LDLCALC 130*  TRIG 75  CHOLHDL 3.8   Thyroid Function Tests: No results for input(s): TSH, T4TOTAL, FREET4, T3FREE, THYROIDAB in the last 72 hours. Anemia Panel: No results for input(s): VITAMINB12, FOLATE, FERRITIN, TIBC, IRON, RETICCTPCT in the last 72 hours. Urine analysis:    Component Value Date/Time   COLORURINE AMBER (A) 11/07/2015 2102   APPEARANCEUR CLEAR 11/07/2015 2102   LABSPEC 1.014 11/07/2015 2102   PHURINE 6.0 11/07/2015 2102   GLUCOSEU NEGATIVE 11/07/2015 2102   HGBUR NEGATIVE 11/07/2015 2102   HGBUR negative 09/28/2009 0923   BILIRUBINUR NEGATIVE 11/07/2015 2102   KETONESUR NEGATIVE 11/07/2015 2102   PROTEINUR NEGATIVE 11/07/2015 2102   UROBILINOGEN 2.0 09/28/2009 0923   NITRITE NEGATIVE 11/07/2015 2102   LEUKOCYTESUR NEGATIVE 11/07/2015 2102   Sepsis Labs: @LABRCNTIP (procalcitonin:4,lacticidven:4)   )No results found for this or any previous visit (from the past 240 hour(s)).    Radiology Studies: Ct Head Wo Contrast Result Date: 11/06/2015 Small chronic infarct right cerebellum. No acute intracranial abnormality. Dental disease.   Mr Maxine GlennMra Neck W Wo Contrast Result Date: 11/07/2015 Normal head and  neck MRA. Mild posterior circulation normal anatomic variation.   Mr Brain Wo Contrast Result Date: 11/07/2015 1. Punctate acute midbrain infarct. 2. Chronic cerebellar infarcts.   Mr Maxine GlennMra Head/brain ZOWo Cm Result Date: 11/07/2015 Normal head and neck MRA. Mild posterior circulation normal anatomic variation.     Scheduled Meds: .  stroke: mapping our early stages of recovery book   Does not apply Once  . amLODipine  10 mg Oral Daily  . aspirin EC  325 mg Oral Daily  . buPROPion  100 mg Oral BID  . heparin  5,000 Units Subcutaneous Q8H  .  hydrALAZINE  50 mg Oral BID  . lisinopril  20 mg Oral Daily   And  . hydrochlorothiazide  12.5 mg Oral Daily  . paliperidone  6 mg Oral QHS  . potassium chloride  40 mEq Oral Once   Continuous Infusions:    LOS: 1 day    Time spent: 25 minutes  Greater than 50% of the time spent on counseling and coordinating the care.   Manson Passey, MD [facility] Pager [phone]  If 7PM-7AM, please contact night-coverage [email or web address] Password [email or web address] 11/08/2015, 9:52 AM

## 2015-11-08 NOTE — Progress Notes (Unsigned)
Screened [facility] for [facility]. Patient is excluded due to age.

## 2015-11-08 NOTE — Evaluation (Signed)
Physical Therapy Evaluation Patient Details Name: Thomas [name] MRN: [id number] DOB: 10/09/1965 Today's Date: 11/08/2015   History of Present Illness  pt is a 50 y/o male with h/o HTN, tobacco abuse bipolar d/o, presenting with 2 week h/o intermittent R blurred vision, right facial asymmetry , and right arm tingling.  MRI reveals punctate acute midbrain infarct and chronic cerebellar infarcts.  Clinical Impression  Pt admitted with/for s/s of stroke.  Pt currently limited functionally due to the problems listed below.  (see problems list.)  Pt will benefit from PT to maximize function and safety to be able to get home safely with available assist of family .     Follow Up Recommendations No PT follow up    Equipment Recommendations  None recommended by PT    Recommendations for Other Services       Precautions / Restrictions Precautions Precautions: None (minimal risk of fall)      Mobility  Bed Mobility Overal bed mobility: Independent                Transfers Overall transfer level: Modified independent                  Ambulation/Gait Ambulation/Gait assistance: Supervision Ambulation Distance (Feet): 300 Feet Assistive device: None Gait Pattern/deviations: Step-through pattern   Gait velocity interpretation: >2.62 ft/sec, indicative of independent community ambulator General Gait Details: generally steady, with few episodes of deviation when challenged with abrupt directional and speed changes, scanning L/R and up and down, backing up and stepping over obstacles.  No overt LOB, close to age appropriate functional levels in a home-like environment  Stairs            Wheelchair Mobility    Modified Rankin (Stroke Patients Only) Modified Rankin (Stroke Patients Only) Pre-Morbid Rankin Score: No symptoms Modified Rankin: Slight disability     Balance Overall balance assessment: Independent                                            Pertinent Vitals/Pain Pain Assessment: Faces Faces Pain Scale: No hurt    Home Living                        Prior Function                 Hand Dominance        Extremity/Trunk Assessment                         Communication      Cognition Arousal/Alertness: Awake/alert Behavior During Therapy: WFL for tasks assessed/performed Overall Cognitive Status: Within Functional Limits for tasks assessed                      General Comments      Exercises     Assessment/Plan    PT Assessment    PT Problem List            PT Treatment Interventions      PT Goals (Current goals can be found in the Care Plan section)       Frequency Min 3X/week   Barriers to discharge        Co-evaluation PT/OT/SLP Co-Evaluation/Treatment: Yes Reason for Co-Treatment: For patient/therapist safety PT goals addressed during session: Mobility/safety with mobility;Balance  End of Session   Activity Tolerance: Patient tolerated treatment well Patient left: in bed;with call bell/phone within reach;with family/visitor present Nurse Communication: Mobility status         Time: 1610-9604 PT Time Calculation (min) (ACUTE ONLY): 27 min   Charges:   PT Evaluation $PT Eval Moderate Complexity: 1 Procedure     PT G Codes:        Geneva Pallas, Eliseo Gum 11/08/2015, 10:32 AM 11/08/2015  [place] Bing, PT [phone]  (pager)

## 2015-11-08 NOTE — Progress Notes (Signed)
Patient A/O, no noted distress, Denies pain. Patient noted vision is better than yesterday

## 2015-11-09 ENCOUNTER — Inpatient Hospital Stay (HOSPITAL_COMMUNITY): Payer: Medicaid Other

## 2015-11-09 ENCOUNTER — Encounter (HOSPITAL_COMMUNITY): Payer: Medicaid Other

## 2015-11-09 DIAGNOSIS — I6789 Other cerebrovascular disease: Secondary | ICD-10-CM

## 2015-11-09 LAB — ECHOCARDIOGRAM COMPLETE
HEIGHTINCHES: 75 in
WEIGHTICAEL: 2820.8 [oz_av]

## 2015-11-09 LAB — BASIC METABOLIC PANEL
Anion gap: 8 (ref 5–15)
BUN: 15 mg/dL (ref 6–20)
CHLORIDE: 103 mmol/L (ref 101–111)
CO2: 22 mmol/L (ref 22–32)
CREATININE: 1.59 mg/dL — AB (ref 0.61–1.24)
Calcium: 9.6 mg/dL (ref 8.9–10.3)
GFR calc Af Amer: 57 mL/min — ABNORMAL LOW (ref >60)
GFR calc non Af Amer: 49 mL/min — ABNORMAL LOW (ref >60)
GLUCOSE: 90 mg/dL (ref 65–99)
POTASSIUM: 3.4 mmol/L — AB (ref 3.5–5.1)
Sodium: 133 mmol/L — ABNORMAL LOW (ref 135–145)

## 2015-11-09 LAB — TROPONIN I
TROPONIN I: 0.03 ng/mL — AB (ref <0.03)
Troponin I: 0.04 ng/mL (ref <0.03)

## 2015-11-09 LAB — HEMOGLOBIN A1C
HEMOGLOBIN A1C: 5 % (ref 4.8–5.6)
Mean Plasma Glucose: 97 mg/dL

## 2015-11-09 MED ORDER — ATORVASTATIN CALCIUM 10 MG PO TABS: 10.0000 mg | ORAL_TABLET | Freq: Every day | ORAL | 1 refills | Status: DC

## 2015-11-09 NOTE — Progress Notes (Signed)
Pt discharge education and instructions completed with pt and spouse at bedside; both voices understanding and denies any questions. Pt IV and telemetry removed; pt to pick up electronically sent prescriptions from preferred pharmacy on file. Pt discharge home with spouse to transport him home. Pt offered wheelchair but he declines and ambulated off unit with his delivered DME cane along with his belongings and spouse. Thomas BucyP. Amo Ariana Juul RN

## 2015-11-09 NOTE — Care Management Note (Signed)
Case Management Note  Patient Details  Name: Nada LibmanSteven L Shutter MRN: [id number] Date of Birth: 06/29/1965  Subjective/Objective:                    Action/Plan: Plan is for patient to discharge home with his wife once his work up is complete. Pt requesting a cane for home. CM spoke to PT and they agree with him having a cane for home. [facility] and he will deliver the equipment to the room.   Expected Discharge Date:                  Expected Discharge Plan:  Home/Self Care  In-House Referral:     Discharge planning Services  CM Consult  Post Acute Care Choice:  Durable Medical Equipment Choice offered to:  Patient  DME Arranged:  Gilmer Morane DME Agency:  [facility]  [facility]     Status of Service:  Completed, signed off  If discussed at Long Length of Stay Meetings, dates discussed:    Additional Comments:  Kermit BaloKelli F Sanaiya Welliver, RN 11/09/2015, 11:19 AM

## 2015-11-09 NOTE — Progress Notes (Signed)
STROKE TEAM PROGRESS NOTE   HISTORY OF PRESENT ILLNESS (per record)  This is a 50 year old right-handed man who presents to the emergency department at the urging of his wife for evaluation of various complaints including blurry vision, tingling in the right arm, and weakness in the right face and arm. History obtained from the patient. His wife is present and offers additional information as needed.  The patient initially states that he has had some weakness in the right arm and face for the past 2 days. He is unable to pinpoint exactly when this started and it is not clear that he ever really experienced the abrupt onset of weakness. However, he does feel as if this has gotten worse over the past 24 hours. 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 In addition, he states that he has had some numbness and tingling in the right arm. However, he says that this is only present when he first wakes up in the morning. With regards to his vision complaints, he says he has blurry vision in his right eye and says this is been present for the past week. His wife believes that all of these symptoms have been present for more than a week but she to feels that they've gotten worse over the past 24-48 hours. This is what prompted her to bring him to the emergency department for further evaluation.   Patient was not administered IV t-PA secondary to delay in arrival. He was admitted for further evaluation and treatment.   SUBJECTIVE (INTERVAL HISTORY) His wife is at the bedside.  Overall he feels his condition is unchanged. He still has double vision.    OBJECTIVE Temp:  [97.5 F (36.4 C)-98.5 F (36.9 C)] 98.1 F (36.7 C) (10/18 0633) Pulse Rate:  [67-81] 81 (10/18 0633) Cardiac Rhythm: Normal sinus rhythm (10/17 1900) Resp:  [18] 18 (10/18 0633) BP: (143-161)/(76-103) 161/103 (10/18  0633) SpO2:  [96 %-100 %] 99 % (10/18 0633)  CBC:   Recent Labs Lab 11/06/15 2023 11/06/15 2036 11/08/15 0224  WBC 4.4  --  4.4  NEUTROABS 1.3*  --   --   HGB 15.1 15.6 16.0  HCT 43.5 46.0 45.6  MCV 86.7  --  85.6  PLT 161  --  160    Basic Metabolic Panel:   Recent Labs Lab 11/08/15 0224 11/09/15 0407  NA 137 133*  K 3.2* 3.4*  CL 105 103  CO2 25 22  GLUCOSE 91 90  BUN 13 15  CREATININE 1.56* 1.59*  CALCIUM 9.4 9.6    Lipid Panel:     Component Value Date/Time   CHOL 196 11/08/2015 0224   TRIG 75 11/08/2015 0224   HDL 51 11/08/2015 0224   CHOLHDL 3.8 11/08/2015 0224   VLDL 15 11/08/2015 0224   LDLCALC 130 (H) 11/08/2015 0224   HgbA1c:  Lab Results  Component Value Date   HGBA1C 5.0 11/08/2015   Urine Drug Screen:     Component Value Date/Time   LABOPIA NONE DETECTED 11/07/2015 2102   COCAINSCRNUR NONE DETECTED 11/07/2015 2102   LABBENZ NONE DETECTED 11/07/2015 2102   AMPHETMU NONE DETECTED 11/07/2015 2102   THCU POSITIVE (A) 11/07/2015 2102   LABBARB NONE DETECTED 11/07/2015 2102     IMAGING  Ct Head Wo Contrast 11/06/2015 Small chronic infarct right cerebellum. No acute intracranial abnormality. Dental  disease.   Mr Brain Wo Contrast 11/07/2015 1. Punctate acute midbrain infarct. 2. Chronic cerebellar infarcts.   Mr Maxine GlennMra Head/brain ZOWo Cm Mr Maxine GlennMra Neck W Wo Contrast 11/07/2015 Normal head and neck MRA. Mild posterior circulation normal anatomic variation.    PHYSICAL EXAM Pleasant middle aged frail african [place] male not in distress. . Afebrile. Head is nontraumatic. Neck is supple without bruit.    Cardiac exam no murmur or gallop. Lungs are clear to auscultation. Distal pulses are well felt. Neurological Exam ;  Awake  Alert oriented x 3. Normal speech and language.eye movements full without nystagmus.fundi were not visualized. Vision acuity and fields appear normal. Hearing is normal. Palatal movements are normal. Face symmetric.  Tongue midline. Normal strength, tone, reflexes and coordination. Normal sensation. Gait deferred.  ASSESSMENT/PLAN Mr. Nada LibmanSteven L Tamayo is a 50 y.o. male with history of HTN, HLD, tobacco use, bipolar d/o presenting with R arm and face weakness x 2 days. He did not receive IV t-PA due to delay in arrival.    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 Old cerebellar infarcts  MRA head and neck  unremarkable  2D Echo  pending   LDL 130  HgbA1c 5.0  Heparin 5000 units sq tid for VTE prophylaxis Diet Heart Room service appropriate? Yes; Fluid consistency: Thin  No antithrombotic prior to admission, now on aspirin 325 mg daily  Patient counseled to be compliant with his antithrombotic medications  Ongoing aggressive stroke risk factor management  Interested in considering  [facility] Stroke Research Trial offered to pt.   [facility] will follow up with patient  Therapy recommendations:  No therapy  Disposition:  Return home  Okay for discharge once stroke workup completed  Follow up [facility] in 6 weeks. Order placed.  Hypertensive Urgency  BP as high as 191/125 in setting of neurologic symptoms  Stablizing Permissive hypertension (OK if < 220/120) but gradually normalize in 5-7 days Long-term BP goal normotensive  Hyperlipidemia  Home meds:  No statin  LDL 130, goal < 70  Now on lipitor 10 g daily  Continue statin at discharge  Other Stroke Risk Factors  Cigarette smoker, advised to stop smoking  ETOH use, advised to drink no more than 2 drink(s) a day  THC use, UDS positive this admission. Patient advised to stop smoking.   Family hx stroke (mother, father, sister)  Other Active Problems  Bipolar, on disability for   Hypokalemia  Acute renal failure superimposed on CKD stage III  Hospital day # 2  Rhoderick MoodyBIBY,SHARON  [facility] See Amion for Pager  information 11/09/2015 9:39 AM  I have personally examined this patient, reviewed notes, independently viewed imaging studies, participated in medical decision making and plan of care.ROS completed by me personally and pertinent positives fully documented  I have made any additions or clarifications directly to the above note. Dc home today after tests. F/U as outpatient in stroke clinic in 6 weeks. Consider possible participation in [facility] dental stroke prevention trial if interested.  Delia HeadyPramod Sethi, MD Medical Director [facility] Pager: [phone] 11/09/2015 12:37 PM  To contact Stroke Continuity provider, please refer to [email or web address]. After hours, contact General Neurology

## 2015-11-09 NOTE — Progress Notes (Signed)
Speech Language Pathology  Patient Details Name: Thomas [name] MRN: [id number] DOB: 04/12/1965 Today's Date: 11/09/2015 Time:  -     Pt screened (no formal assessment). No speech-language or cognitive needs at this time.            Royce MacadamiaLitaker, Breon Rehm Willis 11/09/2015, 2:56 PM   Breck CoonsLisa Willis Lonell FaceLitaker M.Ed [employer]-SLP Pager [phone]

## 2015-11-09 NOTE — Discharge Summary (Signed)
Physician Discharge Summary  Thomas [name] ZOX:[id number] DOB: [birth year] DOA: 11/07/2015  PCP: Dorrene German, MD  Admit date: 11/07/2015 Discharge date: 11/09/2015  Admitted From: Home Disposition:  Home    Recommendations for Outpatient Follow-up:  1. Follow up with PCP in 1-2 weeks 2. Please obtain BMP 3. Discuss kidney disease with PCP 4. Follow up with [facility] in 6 weeks 5. Take aspirin daily 6. Take Lipitor daily  Home Health: No Equipment/Devices: None  Discharge Condition: Stable CODE STATUS: Full Code  Diet recommendation: Heart Healthy  Brief/Interim Summary: 50 -year-old male with past medical history significant for hypertension, dyslipidemia, tobacco use, bipolar disorder who presented to [facility] for evaluation of right blurry vision, right facial asymmetry, right arm tingling and symptoms overall worsening in past 48 hours prior to this admission. On this admission, patient was found to have acute midbrain infarct. Neurology consulted.  TPa not administered secondary to delay in presentation. Patient evaluated by PT/OT and found to not have any needs for outpatient therapy.  Stroke workup performed. Patient discharged with instructions to follow up with [facility] and take new prescriptions.  Discharge Diagnoses:  Principal Problem:   Acute cerebrovascular accident (CVA) (HCC) Active Problems:   TOBACCO ABUSE   HYPERTENSION, BENIGN ESSENTIAL   Hyperlipidemia   AKI (acute kidney injury) (HCC)   Blurry vision, right eye    Discharge Instructions  Discharge Instructions    Ambulatory referral to Neurology    Complete by:  As directed    Stroke patient. Dr. Pearlean Brownie prefers follow up in 6 weeks   Call MD for:  difficulty breathing, headache or visual disturbances    Complete by:  As directed    Call MD for:  extreme fatigue    Complete by:  As directed    Call MD for:  persistant dizziness or light-headedness    Complete by:  As directed    Call MD for:   persistant nausea and vomiting    Complete by:  As directed    Call MD for:  redness, tenderness, or signs of infection (pain, swelling, redness, odor or green/yellow discharge around incision site)    Complete by:  As directed    Call MD for:  severe uncontrolled pain    Complete by:  As directed    Call MD for:  temperature >100.4    Complete by:  As directed    Diet - low sodium heart healthy    Complete by:  As directed    Increase activity slowly    Complete by:  As directed        Medication List    STOP taking these medications   ibuprofen 800 MG tablet Commonly known as:  ADVIL,MOTRIN     TAKE these medications   amLODipine 10 MG tablet Commonly known as:  NORVASC Take 1 tablet (10 mg total) by mouth daily.   aspirin EC 325 MG tablet Take 325 mg by mouth daily.   atorvastatin 10 MG tablet Commonly known as:  LIPITOR Take 1 tablet (10 mg total) by mouth daily at 6 PM. Start taking on:  11/10/2015   buPROPion 100 MG 12 hr tablet Commonly known as:  WELLBUTRIN SR Take 100 mg by mouth 2 (two) times daily.   cyclobenzaprine 10 MG tablet Commonly known as:  FLEXERIL Take 1 tablet (10 mg total) by mouth 3 (three) times daily as needed for muscle spasms (or pain).   hydrALAZINE 50 MG tablet Commonly known as:  APRESOLINE Take 50 mg by mouth 2 (two) times daily. What changed:  Another medication with the same name was removed. Continue taking this medication, and follow the directions you see here.   HYDROcodone-acetaminophen 5-325 MG tablet Commonly known as:  NORCO/VICODIN Take 1 tablet by mouth every 4 (four) hours as needed for moderate pain or severe pain.   lisinopril-hydrochlorothiazide 20-12.5 MG tablet Commonly known as:  PRINZIDE,ZESTORETIC Take 2 tablets by mouth daily.   paliperidone 6 MG 24 hr tablet Commonly known as:  INVEGA Take 6 mg by mouth at bedtime.      Follow-up Information    SETHI,PRAMOD, MD Follow up in 6 week(s).    Specialties:  Neurology, Radiology Why:  stroke clinic. office will call you with appt date and time. Contact information: [address] [place] [address] [phone]          No Known Allergies  Consultations:  Neurology   Procedures/Studies: Ct Head Wo Contrast  Result Date: 11/06/2015 CLINICAL DATA:  Right arm weakness. Right eye vision loss. Double vision. EXAM: CT HEAD WITHOUT CONTRAST TECHNIQUE: Contiguous axial images were obtained from the base of the skull through the vertex without intravenous contrast. COMPARISON:  None. FINDINGS: Brain: Ventricle size normal. Small hypodensity right cerebellum compatible with chronic infarction. Negative for acute infarct. Negative for hemorrhage or mass. Vascular: No hyperdense vessel or unexpected calcification. Skull: Negative Sinuses/Orbits: Retention cyst left maxillary sinus Other: Extensive periapical lucency around upper second molars bilaterally with caries. Dental evaluation recommended. IMPRESSION: Small chronic infarct right cerebellum. No acute intracranial abnormality. Dental disease. Electronically Signed   By: Marlan Palau M.D.   On: 11/06/2015 21:45   Mr Maxine Glenn Neck W Wo Contrast  Result Date: 11/07/2015 CLINICAL DATA:  50 year old male with punctate right paracentral midbrain infarct on MRI today. Right eye would blurred vision, right extremity and face tingling and weakness. Initial encounter. EXAM: MRA HEAD WITHOUT CONTRAST MRA NECK WITHOUT AND WITH CONTRAST TECHNIQUE: Angiographic images of the Circle of Willis were obtained using MRA technique without intravenous contrast. Angiographic images of the neck were obtained using MRA technique without and with intravenous contrast. Carotid stenosis measurements (when applicable) are obtained utilizing NASCET criteria, using the distal internal carotid diameter as the denominator. CONTRAST:  20mL MULTIHANCE GADOBENATE DIMEGLUMINE 529 MG/ML IV SOLN COMPARISON:   Brain MRI 0637 hours today. FINDINGS: MRA NECK FINDINGS Precontrast time-of-flight neck MRA images reveal antegrade flow in both carotid and vertebral artery is throughout the neck and to the skullbase. The vertebral arteries are codominant. Post-contrast neck MRA images reveal a 3 vessel arch configuration with normal great vessel origins. Normal right CCA. Normal right carotid bifurcation. Normal cervical right ICA aside from mild tortuosity. Normal left CCA. Normal left carotid bifurcation. Normal cervical left ICA aside from mild tortuosity. No proximal subclavian artery stenosis. Both vertebral artery origins are normal. No vertebral artery stenosis in the neck. MRA HEAD FINDINGS Antegrade flow in the posterior circulation with codominant distal vertebral arteries. The distal right vertebral artery is somewhat diminutive beyond the right PICA. No convincing distal vertebral artery stenosis. The left AICA appears dominant. Patent vertebrobasilar junction. Patent basilar artery without irregularity or stenosis. Normal SCA origins. Fetal type left PCA origin. Right posterior communicating artery also is present. The basilar tip appears normal. PCA branches appear normal. Antegrade flow in both ICA siphons no siphon stenosis. Normal ophthalmic and posterior communicating artery origins. Normal carotid termini, MCA and ACA origins. Anterior communicating artery and visualized ACA  branches appear normal. MCA M1 segments, MCA bifurcations, and visualized MCA branches appear normal. IMPRESSION: Normal head and neck MRA. Mild posterior circulation normal anatomic variation. Electronically Signed   By: Odessa Fleming M.D.   On: 11/07/2015 12:11   Mr Brain Wo Contrast  Result Date: 11/07/2015 CLINICAL DATA:  Stroke-like symptoms. Right eye blurry vision, right arm tingling, and weakness in the right face and arm. EXAM: MRI HEAD WITHOUT CONTRAST TECHNIQUE: Multiplanar, multiecho pulse sequences of the brain and surrounding  structures were obtained without intravenous contrast. COMPARISON:  Head CT 11/06/2015 FINDINGS: Brain: There is a 3 mm focus of restricted diffusion in the upper midbrain just right of midline adjacent to the cerebral aqueduct. There are small chronic infarcts in the cerebellum bilaterally. The ventricles and sulci are normal in size. No intracranial hemorrhage, mass, midline shift, or extra-axial fluid collection is identified. A few foci of T2 hyperintensity in the cerebral white matter are nonspecific but may reflect early chronic small vessel ischemic disease. Vascular: Major intracranial vascular flow voids are preserved. Skull and upper cervical spine: Unremarkable bone marrow signal. Sinuses/Orbits: Unremarkable orbits. Left maxillary sinus mucous retention cyst. Clear mastoid air cells. Other: None. IMPRESSION: 1. Punctate acute midbrain infarct. 2. Chronic cerebellar infarcts. Electronically Signed   By: Sebastian Ache M.D.   On: 11/07/2015 07:53   Mr Maxine Glenn Head/brain ZO Cm  Result Date: 11/07/2015 CLINICAL DATA:  50 year old male with punctate right paracentral midbrain infarct on MRI today. Right eye would blurred vision, right extremity and face tingling and weakness. Initial encounter. EXAM: MRA HEAD WITHOUT CONTRAST MRA NECK WITHOUT AND WITH CONTRAST TECHNIQUE: Angiographic images of the Circle of Willis were obtained using MRA technique without intravenous contrast. Angiographic images of the neck were obtained using MRA technique without and with intravenous contrast. Carotid stenosis measurements (when applicable) are obtained utilizing NASCET criteria, using the distal internal carotid diameter as the denominator. CONTRAST:  20mL MULTIHANCE GADOBENATE DIMEGLUMINE 529 MG/ML IV SOLN COMPARISON:  Brain MRI 0637 hours today. FINDINGS: MRA NECK FINDINGS Precontrast time-of-flight neck MRA images reveal antegrade flow in both carotid and vertebral artery is throughout the neck and to the skullbase. The  vertebral arteries are codominant. Post-contrast neck MRA images reveal a 3 vessel arch configuration with normal great vessel origins. Normal right CCA. Normal right carotid bifurcation. Normal cervical right ICA aside from mild tortuosity. Normal left CCA. Normal left carotid bifurcation. Normal cervical left ICA aside from mild tortuosity. No proximal subclavian artery stenosis. Both vertebral artery origins are normal. No vertebral artery stenosis in the neck. MRA HEAD FINDINGS Antegrade flow in the posterior circulation with codominant distal vertebral arteries. The distal right vertebral artery is somewhat diminutive beyond the right PICA. No convincing distal vertebral artery stenosis. The left AICA appears dominant. Patent vertebrobasilar junction. Patent basilar artery without irregularity or stenosis. Normal SCA origins. Fetal type left PCA origin. Right posterior communicating artery also is present. The basilar tip appears normal. PCA branches appear normal. Antegrade flow in both ICA siphons no siphon stenosis. Normal ophthalmic and posterior communicating artery origins. Normal carotid termini, MCA and ACA origins. Anterior communicating artery and visualized ACA branches appear normal. MCA M1 segments, MCA bifurcations, and visualized MCA branches appear normal. IMPRESSION: Normal head and neck MRA. Mild posterior circulation normal anatomic variation. Electronically Signed   By: Odessa Fleming M.D.   On: 11/07/2015 12:11    Echocardiogram: Left ventricle: The cavity size was normal. Wall thickness was increased in a pattern of severe  LVH. Systolic function was normal. The estimated ejection fraction was in the range of 60% to 65%. Wall motion was normal; there were no regional wall motion abnormalities. Doppler parameters are consistent with abnormal left ventricular relaxation (grade 1 diastolic   dysfunction). Aortic valve: Calcified non coronary cusp. Atrial septum: No defect or patent foramen ovale  was identified.   Subjective: Patient reports that he is ready to go home.  Reports it is his grandbaby's birthday today and there is a birthday party this evening.  Understands that he will start a new medication at discharge (Lipitor).  Will get set up with PCP at discharge. Patient to follow up with [facility] in 6 weeks.  Discharge Exam: Vitals:   11/09/15 0633 11/09/15 1005  BP: (!) 161/103 (!) 150/96  Pulse: 81 74  Resp: 18 20  Temp: 98.1 F (36.7 C) 98.2 F (36.8 C)   Vitals:   11/08/15 2108 11/09/15 0132 11/09/15 0633 11/09/15 1005  BP: (!) 148/76 (!) 147/95 (!) 161/103 (!) 150/96  Pulse: 70 74 81 74  Resp: 18 18 18 20   Temp: 98.5 F (36.9 C) 98.4 F (36.9 C) 98.1 F (36.7 C) 98.2 F (36.8 C)  TempSrc: Oral Oral Oral Oral  SpO2: 98% 96% 99% 100%  Weight:      Height:        General: Pt is alert, awake, not in acute distress Cardiovascular: RRR, S1/S2 +, no rubs, no gallops Respiratory: CTA bilaterally, no wheezing, no rhonchi Abdominal: Soft, NT, ND, bowel sounds + Extremities: no edema, no cyanosis    The results of significant diagnostics from this hospitalization (including imaging, microbiology, ancillary and laboratory) are listed below for reference.     Microbiology: No results found for this or any previous visit (from the past 240 hour(s)).   Labs: BNP (last 3 results) No results for input(s): BNP in the last 8760 hours. Basic Metabolic Panel:  Recent Labs Lab 11/06/15 2023 11/06/15 2036 11/08/15 0224 11/09/15 0407  NA 139 141 137 133*  K 3.6 3.7 3.2* 3.4*  CL 104 103 105 103  CO2 27  --  25 22  GLUCOSE 96 94 91 90  BUN 16 18 13 15   CREATININE 1.81* 1.90* 1.56* 1.59*  CALCIUM 9.7  --  9.4 9.6   Liver Function Tests:  Recent Labs Lab 11/06/15 2023 11/08/15 0224  AST 99* 100*  ALT 112* 128*  ALKPHOS 77 78  BILITOT 0.9 1.0  PROT 7.3 7.8  ALBUMIN 3.8 3.9   No results for input(s): LIPASE, AMYLASE in the last 168 hours. No results  for input(s): AMMONIA in the last 168 hours. CBC:  Recent Labs Lab 11/06/15 2023 11/06/15 2036 11/08/15 0224  WBC 4.4  --  4.4  NEUTROABS 1.3*  --   --   HGB 15.1 15.6 16.0  HCT 43.5 46.0 45.6  MCV 86.7  --  85.6  PLT 161  --  160   Cardiac Enzymes:  Recent Labs Lab 11/08/15 1009 11/08/15 1446 11/08/15 2147 11/09/15 0407 11/09/15 1009  TROPONINI 0.04* 0.04* 0.04* 0.04* 0.03*   BNP: Invalid input(s): POCBNP CBG:  Recent Labs Lab 11/06/15 2027  GLUCAP 121*   D-Dimer No results for input(s): DDIMER in the last 72 hours. Hgb A1c  Recent Labs  11/08/15 0224  HGBA1C 5.0   Lipid Profile  Recent Labs  11/08/15 0224  CHOL 196  HDL 51  LDLCALC 130*  TRIG 75  CHOLHDL 3.8   Thyroid function studies  No results for input(s): TSH, T4TOTAL, T3FREE, THYROIDAB in the last 72 hours.  Invalid input(s): FREET3 Anemia work up No results for input(s): VITAMINB12, FOLATE, FERRITIN, TIBC, IRON, RETICCTPCT in the last 72 hours. Urinalysis    Component Value Date/Time   COLORURINE AMBER (A) 11/07/2015 2102   APPEARANCEUR CLEAR 11/07/2015 2102   LABSPEC 1.014 11/07/2015 2102   PHURINE 6.0 11/07/2015 2102   GLUCOSEU NEGATIVE 11/07/2015 2102   HGBUR NEGATIVE 11/07/2015 2102   HGBUR negative 09/28/2009 0923   BILIRUBINUR NEGATIVE 11/07/2015 2102   KETONESUR NEGATIVE 11/07/2015 2102   PROTEINUR NEGATIVE 11/07/2015 2102   UROBILINOGEN 2.0 09/28/2009 0923   NITRITE NEGATIVE 11/07/2015 2102   LEUKOCYTESUR NEGATIVE 11/07/2015 2102   Sepsis Labs Invalid input(s): PROCALCITONIN,  WBC,  LACTICIDVEN Microbiology No results found for this or any previous visit (from the past 240 hour(s)).   Time coordinating discharge: Less than 30 minutes  SIGNED:   Bennett ScrapeAlex Ukleja, MD  [facility] 11/09/2015, 5:06 PM Pager [phone] If 7PM-7AM, please contact night-coverage [email or web address] Password [email or web address]

## 2015-11-09 NOTE — Progress Notes (Signed)
  Echocardiogram 2D Echocardiogram has been performed.  Thomas SavoyCasey N Edie [name] 11/09/2015, 1:00 PM

## 2015-11-09 NOTE — Progress Notes (Signed)
PROGRESS NOTE    Thomas [name]  ONG:[id number] DOB: 07-10-1965 DOA: 11/07/2015 PCP: Dorrene German, MD    Brief Narrative:  50 -year-old male with past medical history significant for hypertension, dyslipidemia, tobacco use, bipolar disorder who presented to [facility] for evaluation of right blurry vision, right facial asymmetry, right arm tingling and symptoms overall worsening in past 48 hours prior to this admission. On this admission, patient was found to have acute midbrain infarct. Neurology consulted.  TPa not administered secondary to delay in presentation. Patient evaluated by PT/OT and found to not have any needs for outpatient therapy.     Assessment & Plan:   Principal Problem:   Acute cerebrovascular accident (CVA) (HCC) Active Problems:   TOBACCO ABUSE   HYPERTENSION, BENIGN ESSENTIAL   Hyperlipidemia   AKI (acute kidney injury) (HCC)   Blurry vision, right eye   Stroke work up initiated:  - Aspirin daily - MRI brain / MRA brain - acute punctate midbrain infarct. Normal MRA - 2D ECHO - pending  - Carotid doppler - pending  - HgbA1c- 5.0   , Lipid panel - LDL 130. LDL goal < 100. We'll start Lipitor 10 mg daily. - Diet: Heart healthy - PT- no PT follow up recommended  Other Stroke Risk Factors : Smoking, hypertension - Appreciate neurology following   Active Problems:   TOBACCO ABUSE - Counseled on smoking cessation - UDS positive for THC     HYPERTENSION, BENIGN ESSENTIAL - Continue Norvasc, HCTZ, lisinopril    Hyperlipidemia - Started atorvastatin 10 mg at bedtime - LDL 130    Hypokalemia - Supplemented     Acute renal failure superimposed on Chronic kidney disease stage III - Baseline creatinine 1.46 in June 2014 - Creatinine 1.9 on this admission, slightly above the baseline - Cr at 1.59    DVT prophylaxis: Heparin subQ  Code Status: full code  Family Communication: wife and daughter bedside Disposition Plan: home once  stroke work up completed- possibly today (echo pending)   Consultants:   Neurology  PT/ OT  Procedures:   Echocardiogram pending  Antimicrobials:   none    Subjective: Patient seen and evaluated this morning.  His wife is bedside with his daughter.  Patient wife asking appropriate questions about follow up and other changes that can be made for patient's lifestyle to decrease risk of strokes.  Patient reports that he is ambulating without difficulty.  He mentions that he is eating well, urinating and stooling normally.  No concerns for chest pain or chest pressure.  Echocardiogram scheduled for today.    Objective: Vitals:   11/08/15 1900 11/08/15 2108 11/09/15 0132 11/09/15 0633  BP: (!) 149/91 (!) 148/76 (!) 147/95 (!) 161/103  Pulse: 67 70 74 81  Resp: 18 18 18 18   Temp: 97.9 F (36.6 C) 98.5 F (36.9 C) 98.4 F (36.9 C) 98.1 F (36.7 C)  TempSrc: Oral Oral Oral Oral  SpO2: 100% 98% 96% 99%  Weight:      Height:       No intake or output data in the 24 hours ending 11/09/15 0934 Filed Weights   11/07/15 1236  Weight: 80 kg (176 lb 4.8 oz)    Examination:  General exam: Appears calm and comfortable  Respiratory system: Clear to auscultation. Respiratory effort normal. Cardiovascular system: S1 & S2 heard, RRR. No JVD, murmurs, rubs, gallops or clicks. No pedal edema. Gastrointestinal system: Abdomen is nondistended, soft and nontender. No organomegaly or masses  felt. Normal bowel sounds heard. Central nervous system: Alert and oriented. No focal neurological deficits. Extremities: Symmetric 5 x 5 power. Skin: No rashes, lesions or ulcers. Numerous tattoos on upper extremities, chest and back Psychiatry: Judgement and insight appear normal. Mood & affect appropriate.     Data Reviewed: I have personally reviewed following labs and imaging studies  CBC:  Recent Labs Lab 11/06/15 2023 11/06/15 2036 11/08/15 0224  WBC 4.4  --  4.4  NEUTROABS 1.3*  --    --   HGB 15.1 15.6 16.0  HCT 43.5 46.0 45.6  MCV 86.7  --  85.6  PLT 161  --  160   Basic Metabolic Panel:  Recent Labs Lab 11/06/15 2023 11/06/15 2036 11/08/15 0224 11/09/15 0407  NA 139 141 137 133*  K 3.6 3.7 3.2* 3.4*  CL 104 103 105 103  CO2 27  --  25 22  GLUCOSE 96 94 91 90  BUN 16 18 13 15   CREATININE 1.81* 1.90* 1.56* 1.59*  CALCIUM 9.7  --  9.4 9.6   GFR: Estimated Creatinine Clearance: 63.6 mL/min (by C-G formula based on SCr of 1.59 mg/dL (H)). Liver Function Tests:  Recent Labs Lab 11/06/15 2023 11/08/15 0224  AST 99* 100*  ALT 112* 128*  ALKPHOS 77 78  BILITOT 0.9 1.0  PROT 7.3 7.8  ALBUMIN 3.8 3.9   No results for input(s): LIPASE, AMYLASE in the last 168 hours. No results for input(s): AMMONIA in the last 168 hours. Coagulation Profile:  Recent Labs Lab 11/06/15 2023 11/07/15 0950  INR 1.12 1.08   Cardiac Enzymes:  Recent Labs Lab 11/08/15 0224 11/08/15 1009 11/08/15 1446 11/08/15 2147 11/09/15 0407  TROPONINI 0.04* 0.04* 0.04* 0.04* 0.04*   BNP (last 3 results) No results for input(s): PROBNP in the last 8760 hours. HbA1C:  Recent Labs  11/08/15 0224  HGBA1C 5.0   CBG:  Recent Labs Lab 11/06/15 2027  GLUCAP 121*   Lipid Profile:  Recent Labs  11/08/15 0224  CHOL 196  HDL 51  LDLCALC 130*  TRIG 75  CHOLHDL 3.8   Thyroid Function Tests: No results for input(s): TSH, T4TOTAL, FREET4, T3FREE, THYROIDAB in the last 72 hours. Anemia Panel: No results for input(s): VITAMINB12, FOLATE, FERRITIN, TIBC, IRON, RETICCTPCT in the last 72 hours. Sepsis Labs: No results for input(s): PROCALCITON, LATICACIDVEN in the last 168 hours.  No results found for this or any previous visit (from the past 240 hour(s)).       Radiology Studies: Mr Maxine Glenn Neck W Wo Contrast  Result Date: 11/07/2015 CLINICAL DATA:  50 year old male with punctate right paracentral midbrain infarct on MRI today. Right eye would blurred vision,  right extremity and face tingling and weakness. Initial encounter. EXAM: MRA HEAD WITHOUT CONTRAST MRA NECK WITHOUT AND WITH CONTRAST TECHNIQUE: Angiographic images of the Circle of Willis were obtained using MRA technique without intravenous contrast. Angiographic images of the neck were obtained using MRA technique without and with intravenous contrast. Carotid stenosis measurements (when applicable) are obtained utilizing NASCET criteria, using the distal internal carotid diameter as the denominator. CONTRAST:  20mL MULTIHANCE GADOBENATE DIMEGLUMINE 529 MG/ML IV SOLN COMPARISON:  Brain MRI 0637 hours today. FINDINGS: MRA NECK FINDINGS Precontrast time-of-flight neck MRA images reveal antegrade flow in both carotid and vertebral artery is throughout the neck and to the skullbase. The vertebral arteries are codominant. Post-contrast neck MRA images reveal a 3 vessel arch configuration with normal great vessel origins. Normal right CCA. Normal right  carotid bifurcation. Normal cervical right ICA aside from mild tortuosity. Normal left CCA. Normal left carotid bifurcation. Normal cervical left ICA aside from mild tortuosity. No proximal subclavian artery stenosis. Both vertebral artery origins are normal. No vertebral artery stenosis in the neck. MRA HEAD FINDINGS Antegrade flow in the posterior circulation with codominant distal vertebral arteries. The distal right vertebral artery is somewhat diminutive beyond the right PICA. No convincing distal vertebral artery stenosis. The left AICA appears dominant. Patent vertebrobasilar junction. Patent basilar artery without irregularity or stenosis. Normal SCA origins. Fetal type left PCA origin. Right posterior communicating artery also is present. The basilar tip appears normal. PCA branches appear normal. Antegrade flow in both ICA siphons no siphon stenosis. Normal ophthalmic and posterior communicating artery origins. Normal carotid termini, MCA and ACA origins.  Anterior communicating artery and visualized ACA branches appear normal. MCA M1 segments, MCA bifurcations, and visualized MCA branches appear normal. IMPRESSION: Normal head and neck MRA. Mild posterior circulation normal anatomic variation. Electronically Signed   By: Odessa Fleming M.D.   On: 11/07/2015 12:11   Mr Maxine Glenn Head/brain ZO Cm  Result Date: 11/07/2015 CLINICAL DATA:  50 year old male with punctate right paracentral midbrain infarct on MRI today. Right eye would blurred vision, right extremity and face tingling and weakness. Initial encounter. EXAM: MRA HEAD WITHOUT CONTRAST MRA NECK WITHOUT AND WITH CONTRAST TECHNIQUE: Angiographic images of the Circle of Willis were obtained using MRA technique without intravenous contrast. Angiographic images of the neck were obtained using MRA technique without and with intravenous contrast. Carotid stenosis measurements (when applicable) are obtained utilizing NASCET criteria, using the distal internal carotid diameter as the denominator. CONTRAST:  20mL MULTIHANCE GADOBENATE DIMEGLUMINE 529 MG/ML IV SOLN COMPARISON:  Brain MRI 0637 hours today. FINDINGS: MRA NECK FINDINGS Precontrast time-of-flight neck MRA images reveal antegrade flow in both carotid and vertebral artery is throughout the neck and to the skullbase. The vertebral arteries are codominant. Post-contrast neck MRA images reveal a 3 vessel arch configuration with normal great vessel origins. Normal right CCA. Normal right carotid bifurcation. Normal cervical right ICA aside from mild tortuosity. Normal left CCA. Normal left carotid bifurcation. Normal cervical left ICA aside from mild tortuosity. No proximal subclavian artery stenosis. Both vertebral artery origins are normal. No vertebral artery stenosis in the neck. MRA HEAD FINDINGS Antegrade flow in the posterior circulation with codominant distal vertebral arteries. The distal right vertebral artery is somewhat diminutive beyond the right PICA. No  convincing distal vertebral artery stenosis. The left AICA appears dominant. Patent vertebrobasilar junction. Patent basilar artery without irregularity or stenosis. Normal SCA origins. Fetal type left PCA origin. Right posterior communicating artery also is present. The basilar tip appears normal. PCA branches appear normal. Antegrade flow in both ICA siphons no siphon stenosis. Normal ophthalmic and posterior communicating artery origins. Normal carotid termini, MCA and ACA origins. Anterior communicating artery and visualized ACA branches appear normal. MCA M1 segments, MCA bifurcations, and visualized MCA branches appear normal. IMPRESSION: Normal head and neck MRA. Mild posterior circulation normal anatomic variation. Electronically Signed   By: Odessa Fleming M.D.   On: 11/07/2015 12:11        Scheduled Meds: .  stroke: mapping our early stages of recovery book   Does not apply Once  . amLODipine  10 mg Oral Daily  . aspirin EC  325 mg Oral Daily  . atorvastatin  10 mg Oral q1800  . buPROPion  100 mg Oral BID  . heparin  5,000 Units  Subcutaneous Q8H  . hydrALAZINE  50 mg Oral BID  . lisinopril  20 mg Oral Daily   And  . hydrochlorothiazide  12.5 mg Oral Daily  . paliperidone  6 mg Oral QHS   Continuous Infusions:    LOS: 2 days    Time spent: 35 minutes    Bennett ScrapeAlex Ukleja, MD [facility] Pager [phone]  If 7PM-7AM, please contact night-coverage [email or web address] Password [email or web address] 11/09/2015, 9:34 AM

## 2015-11-10 ENCOUNTER — Telehealth: Payer: Self-pay | Admitting: Neurology

## 2015-11-10 NOTE — Telephone Encounter (Signed)
I called to schedule hospital f/u for pt with Dr Pearlean BrownieSethi and spoke to pt wife she said that he needs a refill on his medication from the hospital she does not know the name of it please call her back dg

## 2015-11-10 NOTE — Telephone Encounter (Signed)
LFt vm for patients wife that if the medication was lipitor . Another MD prescribed lipitor for her husband.

## 2015-12-12 ENCOUNTER — Ambulatory Visit: Payer: Self-pay | Admitting: Neurology

## 2016-02-09 ENCOUNTER — Ambulatory Visit: Payer: Self-pay | Admitting: Neurology

## 2016-02-21 ENCOUNTER — Ambulatory Visit: Payer: Self-pay | Admitting: Neurology

## 2016-02-23 ENCOUNTER — Encounter (HOSPITAL_COMMUNITY): Payer: Self-pay | Admitting: Emergency Medicine

## 2016-02-23 ENCOUNTER — Emergency Department (HOSPITAL_COMMUNITY): Payer: Medicaid Other

## 2016-02-23 ENCOUNTER — Encounter: Payer: Self-pay | Admitting: Neurology

## 2016-02-23 ENCOUNTER — Inpatient Hospital Stay (HOSPITAL_COMMUNITY)
Admission: EM | Admit: 2016-02-23 | Discharge: 2016-02-26 | DRG: 689 | Disposition: A | Discharge status: Home / Self Care | Payer: Medicaid Other | Attending: Internal Medicine | Admitting: Internal Medicine

## 2016-02-23 DIAGNOSIS — F17200 Nicotine dependence, unspecified, uncomplicated: Secondary | ICD-10-CM | POA: Diagnosis present

## 2016-02-23 DIAGNOSIS — R079 Chest pain, unspecified: Secondary | ICD-10-CM | POA: Diagnosis present

## 2016-02-23 DIAGNOSIS — R509 Fever, unspecified: Secondary | ICD-10-CM

## 2016-02-23 DIAGNOSIS — R4182 Altered mental status, unspecified: Secondary | ICD-10-CM

## 2016-02-23 DIAGNOSIS — Z8673 Personal history of transient ischemic attack (TIA), and cerebral infarction without residual deficits: Secondary | ICD-10-CM

## 2016-02-23 DIAGNOSIS — I10 Essential (primary) hypertension: Secondary | ICD-10-CM | POA: Diagnosis present

## 2016-02-23 DIAGNOSIS — N390 Urinary tract infection, site not specified: Secondary | ICD-10-CM

## 2016-02-23 DIAGNOSIS — N3000 Acute cystitis without hematuria: Secondary | ICD-10-CM

## 2016-02-23 DIAGNOSIS — G9340 Encephalopathy, unspecified: Secondary | ICD-10-CM

## 2016-02-23 DIAGNOSIS — N179 Acute kidney failure, unspecified: Secondary | ICD-10-CM | POA: Diagnosis present

## 2016-02-23 DIAGNOSIS — N189 Chronic kidney disease, unspecified: Secondary | ICD-10-CM

## 2016-02-23 LAB — COMPREHENSIVE METABOLIC PANEL
ALK PHOS: 71 U/L (ref 38–126)
ALT: 88 U/L — AB (ref 17–63)
ALT: 85 U/L — AB (ref 17–63)
ANION GAP: 12 (ref 5–15)
AST: 64 U/L — ABNORMAL HIGH (ref 15–41)
AST: 61 U/L — ABNORMAL HIGH (ref 15–41)
Albumin: 3.8 g/dL (ref 3.5–5.0)
Albumin: 3.7 g/dL (ref 3.5–5.0)
Alkaline Phosphatase: 76 U/L (ref 38–126)
Anion gap: 12 (ref 5–15)
BUN: 15 mg/dL (ref 6–20)
BUN: 16 mg/dL (ref 6–20)
CALCIUM: 9.5 mg/dL (ref 8.9–10.3)
CALCIUM: 9.3 mg/dL (ref 8.9–10.3)
CHLORIDE: 98 mmol/L — AB (ref 101–111)
CHLORIDE: 100 mmol/L — AB (ref 101–111)
CO2: 26 mmol/L (ref 22–32)
CO2: 23 mmol/L (ref 22–32)
CREATININE: 1.9 mg/dL — AB (ref 0.61–1.24)
CREATININE: 1.92 mg/dL — AB (ref 0.61–1.24)
GFR, EST AFRICAN AMERICAN: 46 mL/min — AB (ref >60)
GFR, EST AFRICAN AMERICAN: 46 mL/min — AB (ref >60)
GFR, EST NON AFRICAN AMERICAN: 40 mL/min — AB (ref >60)
GFR, EST NON AFRICAN AMERICAN: 39 mL/min — AB (ref >60)
Glucose, Bld: 102 mg/dL — ABNORMAL HIGH (ref 65–99)
Glucose, Bld: 125 mg/dL — ABNORMAL HIGH (ref 65–99)
Potassium: 3.1 mmol/L — ABNORMAL LOW (ref 3.5–5.1)
Potassium: 3.2 mmol/L — ABNORMAL LOW (ref 3.5–5.1)
Sodium: 136 mmol/L (ref 135–145)
Sodium: 135 mmol/L (ref 135–145)
Total Bilirubin: 1.6 mg/dL — ABNORMAL HIGH (ref 0.3–1.2)
Total Bilirubin: 1.8 mg/dL — ABNORMAL HIGH (ref 0.3–1.2)
Total Protein: 8.4 g/dL — ABNORMAL HIGH (ref 6.5–8.1)
Total Protein: 8.2 g/dL — ABNORMAL HIGH (ref 6.5–8.1)

## 2016-02-23 LAB — DIFFERENTIAL
BASOS PCT: 0 %
Basophils Absolute: 0 10*3/uL (ref 0.0–0.1)
EOS ABS: 0 10*3/uL (ref 0.0–0.7)
Eosinophils Relative: 0 %
LYMPHS ABS: 2.8 10*3/uL (ref 0.7–4.0)
LYMPHS PCT: 27 %
Monocytes Absolute: 1.1 10*3/uL — ABNORMAL HIGH (ref 0.1–1.0)
Monocytes Relative: 11 %
NEUTROS ABS: 6.5 10*3/uL (ref 1.7–7.7)
Neutrophils Relative %: 62 %

## 2016-02-23 LAB — I-STAT CHEM 8, ED
BUN: 19 mg/dL (ref 6–20)
Calcium, Ion: 0.99 mmol/L — ABNORMAL LOW (ref 1.15–1.40)
Chloride: 101 mmol/L (ref 101–111)
Creatinine, Ser: 1.9 mg/dL — ABNORMAL HIGH (ref 0.61–1.24)
Glucose, Bld: 124 mg/dL — ABNORMAL HIGH (ref 65–99)
HEMATOCRIT: 46 % (ref 39.0–52.0)
HEMOGLOBIN: 15.6 g/dL (ref 13.0–17.0)
Potassium: 3.1 mmol/L — ABNORMAL LOW (ref 3.5–5.1)
SODIUM: 137 mmol/L (ref 135–145)
TCO2: 23 mmol/L (ref 0–100)

## 2016-02-23 LAB — CBG MONITORING, ED
GLUCOSE-CAPILLARY: 111 mg/dL — AB (ref 65–99)
GLUCOSE-CAPILLARY: 125 mg/dL — AB (ref 65–99)

## 2016-02-23 LAB — CBC
HCT: 45.8 % (ref 39.0–52.0)
HCT: 44.8 % (ref 39.0–52.0)
Hemoglobin: 15.8 g/dL (ref 13.0–17.0)
Hemoglobin: 15.6 g/dL (ref 13.0–17.0)
MCH: 30.2 pg (ref 26.0–34.0)
MCH: 30.1 pg (ref 26.0–34.0)
MCHC: 34.5 g/dL (ref 30.0–36.0)
MCHC: 34.8 g/dL (ref 30.0–36.0)
MCV: 87.4 fL (ref 78.0–100.0)
MCV: 86.5 fL (ref 78.0–100.0)
PLATELETS: 181 10*3/uL (ref 150–400)
PLATELETS: 146 10*3/uL — AB (ref 150–400)
RBC: 5.24 MIL/uL (ref 4.22–5.81)
RBC: 5.18 MIL/uL (ref 4.22–5.81)
RDW: 14.8 % (ref 11.5–15.5)
RDW: 14.8 % (ref 11.5–15.5)
WBC: 9.6 10*3/uL (ref 4.0–10.5)
WBC: 10.4 10*3/uL (ref 4.0–10.5)

## 2016-02-23 LAB — APTT: aPTT: 30 seconds (ref 24–36)

## 2016-02-23 LAB — PROTIME-INR
INR: 1.21
Prothrombin Time: 15.4 seconds — ABNORMAL HIGH (ref 11.4–15.2)

## 2016-02-23 LAB — I-STAT TROPONIN, ED: TROPONIN I, POC: 0.04 ng/mL (ref 0.00–0.08)

## 2016-02-23 LAB — ETHANOL

## 2016-02-23 LAB — I-STAT CG4 LACTIC ACID, ED: Lactic Acid, Venous: 1.18 mmol/L (ref 0.5–1.9)

## 2016-02-23 MED ORDER — IOPAMIDOL (ISOVUE-370) INJECTION 76%
INTRAVENOUS | Status: AC
  Filled 2016-02-23: qty 100
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
## 2016-02-23 NOTE — ED Notes (Signed)
Patient transported to CT 

## 2016-02-23 NOTE — ED Notes (Signed)
ED Provider at bedside. 

## 2016-02-23 NOTE — ED Triage Notes (Addendum)
Pt reports he has been sick involving weakness, ear ache, sore throat, tingling in legs and right arm.  Today chills, sweating, auditory hallucinations and confusion.  Hx of stroke(did not go to neuro[facility]).  Wife states his mental status "comes and goes".

## 2016-02-24 ENCOUNTER — Emergency Department (HOSPITAL_COMMUNITY): Payer: Medicaid Other

## 2016-02-24 ENCOUNTER — Observation Stay (HOSPITAL_BASED_OUTPATIENT_CLINIC_OR_DEPARTMENT_OTHER): Payer: Medicaid Other

## 2016-02-24 ENCOUNTER — Observation Stay (HOSPITAL_COMMUNITY): Payer: Medicaid Other

## 2016-02-24 DIAGNOSIS — E785 Hyperlipidemia, unspecified: Secondary | ICD-10-CM

## 2016-02-24 DIAGNOSIS — F17200 Nicotine dependence, unspecified, uncomplicated: Secondary | ICD-10-CM

## 2016-02-24 DIAGNOSIS — R4182 Altered mental status, unspecified: Secondary | ICD-10-CM | POA: Diagnosis present

## 2016-02-24 DIAGNOSIS — Z23 Encounter for immunization: Secondary | ICD-10-CM | POA: Diagnosis not present

## 2016-02-24 DIAGNOSIS — I10 Essential (primary) hypertension: Secondary | ICD-10-CM

## 2016-02-24 DIAGNOSIS — I129 Hypertensive chronic kidney disease with stage 1 through stage 4 chronic kidney disease, or unspecified chronic kidney disease: Secondary | ICD-10-CM | POA: Diagnosis present

## 2016-02-24 DIAGNOSIS — R079 Chest pain, unspecified: Secondary | ICD-10-CM | POA: Diagnosis present

## 2016-02-24 DIAGNOSIS — N3001 Acute cystitis with hematuria: Secondary | ICD-10-CM | POA: Diagnosis not present

## 2016-02-24 DIAGNOSIS — G9341 Metabolic encephalopathy: Secondary | ICD-10-CM | POA: Diagnosis not present

## 2016-02-24 DIAGNOSIS — N3000 Acute cystitis without hematuria: Secondary | ICD-10-CM | POA: Diagnosis not present

## 2016-02-24 DIAGNOSIS — N390 Urinary tract infection, site not specified: Secondary | ICD-10-CM | POA: Diagnosis present

## 2016-02-24 DIAGNOSIS — F319 Bipolar disorder, unspecified: Secondary | ICD-10-CM | POA: Diagnosis present

## 2016-02-24 DIAGNOSIS — Z8673 Personal history of transient ischemic attack (TIA), and cerebral infarction without residual deficits: Secondary | ICD-10-CM

## 2016-02-24 DIAGNOSIS — G9340 Encephalopathy, unspecified: Secondary | ICD-10-CM

## 2016-02-24 DIAGNOSIS — R740 Nonspecific elevation of levels of transaminase and lactic acid dehydrogenase [LDH]: Secondary | ICD-10-CM | POA: Diagnosis present

## 2016-02-24 DIAGNOSIS — Z7982 Long term (current) use of aspirin: Secondary | ICD-10-CM | POA: Diagnosis not present

## 2016-02-24 DIAGNOSIS — B953 Streptococcus pneumoniae as the cause of diseases classified elsewhere: Secondary | ICD-10-CM | POA: Diagnosis present

## 2016-02-24 DIAGNOSIS — Z825 Family history of asthma and other chronic lower respiratory diseases: Secondary | ICD-10-CM | POA: Diagnosis not present

## 2016-02-24 DIAGNOSIS — Z833 Family history of diabetes mellitus: Secondary | ICD-10-CM | POA: Diagnosis not present

## 2016-02-24 DIAGNOSIS — N179 Acute kidney failure, unspecified: Secondary | ICD-10-CM | POA: Diagnosis present

## 2016-02-24 DIAGNOSIS — E876 Hypokalemia: Secondary | ICD-10-CM

## 2016-02-24 DIAGNOSIS — N182 Chronic kidney disease, stage 2 (mild): Secondary | ICD-10-CM | POA: Diagnosis present

## 2016-02-24 DIAGNOSIS — R778 Other specified abnormalities of plasma proteins: Secondary | ICD-10-CM | POA: Diagnosis present

## 2016-02-24 DIAGNOSIS — R7881 Bacteremia: Secondary | ICD-10-CM | POA: Diagnosis present

## 2016-02-24 DIAGNOSIS — Z823 Family history of stroke: Secondary | ICD-10-CM | POA: Diagnosis not present

## 2016-02-24 DIAGNOSIS — R404 Transient alteration of awareness: Secondary | ICD-10-CM | POA: Diagnosis not present

## 2016-02-24 DIAGNOSIS — F1290 Cannabis use, unspecified, uncomplicated: Secondary | ICD-10-CM | POA: Diagnosis present

## 2016-02-24 DIAGNOSIS — M791 Myalgia: Secondary | ICD-10-CM | POA: Diagnosis present

## 2016-02-24 DIAGNOSIS — R202 Paresthesia of skin: Secondary | ICD-10-CM | POA: Diagnosis present

## 2016-02-24 DIAGNOSIS — Z8249 Family history of ischemic heart disease and other diseases of the circulatory system: Secondary | ICD-10-CM | POA: Diagnosis not present

## 2016-02-24 LAB — CBC
HEMATOCRIT: 42.6 % (ref 39.0–52.0)
Hemoglobin: 14.8 g/dL (ref 13.0–17.0)
MCH: 30.5 pg (ref 26.0–34.0)
MCHC: 34.7 g/dL (ref 30.0–36.0)
MCV: 87.7 fL (ref 78.0–100.0)
Platelets: 143 10*3/uL — ABNORMAL LOW (ref 150–400)
RBC: 4.86 MIL/uL (ref 4.22–5.81)
RDW: 14.7 % (ref 11.5–15.5)
WBC: 8.8 10*3/uL (ref 4.0–10.5)

## 2016-02-24 LAB — BLOOD CULTURE ID PANEL (REFLEXED)
Acinetobacter baumannii: NOT DETECTED
CANDIDA ALBICANS: NOT DETECTED
CANDIDA TROPICALIS: NOT DETECTED
Candida glabrata: NOT DETECTED
Candida krusei: NOT DETECTED
Candida parapsilosis: NOT DETECTED
ENTEROBACTERIACEAE SPECIES: NOT DETECTED
Enterobacter cloacae complex: NOT DETECTED
Enterococcus species: NOT DETECTED
Escherichia coli: NOT DETECTED
HAEMOPHILUS INFLUENZAE: NOT DETECTED
KLEBSIELLA PNEUMONIAE: NOT DETECTED
Klebsiella oxytoca: NOT DETECTED
Listeria monocytogenes: NOT DETECTED
NEISSERIA MENINGITIDIS: NOT DETECTED
PROTEUS SPECIES: NOT DETECTED
Pseudomonas aeruginosa: NOT DETECTED
STAPHYLOCOCCUS SPECIES: NOT DETECTED
STREPTOCOCCUS AGALACTIAE: NOT DETECTED
STREPTOCOCCUS SPECIES: DETECTED — AB
Serratia marcescens: NOT DETECTED
Staphylococcus aureus (BCID): NOT DETECTED
Streptococcus pneumoniae: DETECTED — AB
Streptococcus pyogenes: NOT DETECTED

## 2016-02-24 LAB — URINALYSIS, ROUTINE W REFLEX MICROSCOPIC
BILIRUBIN URINE: NEGATIVE
Glucose, UA: NEGATIVE mg/dL
HGB URINE DIPSTICK: NEGATIVE
KETONES UR: NEGATIVE mg/dL
Nitrite: NEGATIVE
PROTEIN: NEGATIVE mg/dL
SPECIFIC GRAVITY, URINE: 1.04 — AB (ref 1.005–1.030)
pH: 5 (ref 5.0–8.0)

## 2016-02-24 LAB — ECHOCARDIOGRAM COMPLETE
HEIGHTINCHES: 75 in
WEIGHTICAEL: 2816 [oz_av]

## 2016-02-24 LAB — RAPID URINE DRUG SCREEN, HOSP PERFORMED
Amphetamines: NOT DETECTED
Amphetamines: NOT DETECTED
BARBITURATES: NOT DETECTED
BENZODIAZEPINES: NOT DETECTED
Barbiturates: NOT DETECTED
Benzodiazepines: NOT DETECTED
COCAINE: NOT DETECTED
Cocaine: NOT DETECTED
OPIATES: NOT DETECTED
Opiates: NOT DETECTED
Tetrahydrocannabinol: POSITIVE — AB
Tetrahydrocannabinol: POSITIVE — AB

## 2016-02-24 LAB — BASIC METABOLIC PANEL
Anion gap: 14 (ref 5–15)
BUN: 15 mg/dL (ref 6–20)
CO2: 24 mmol/L (ref 22–32)
Calcium: 9 mg/dL (ref 8.9–10.3)
Chloride: 99 mmol/L — ABNORMAL LOW (ref 101–111)
Creatinine, Ser: 1.56 mg/dL — ABNORMAL HIGH (ref 0.61–1.24)
GFR calc Af Amer: 59 mL/min — ABNORMAL LOW (ref >60)
GFR, EST NON AFRICAN AMERICAN: 51 mL/min — AB (ref >60)
GLUCOSE: 128 mg/dL — AB (ref 65–99)
POTASSIUM: 3.7 mmol/L (ref 3.5–5.1)
Sodium: 137 mmol/L (ref 135–145)

## 2016-02-24 LAB — TROPONIN I
TROPONIN I: 0.04 ng/mL — AB (ref <0.03)
Troponin I: 0.05 ng/mL (ref <0.03)
Troponin I: 0.04 ng/mL (ref <0.03)

## 2016-02-24 LAB — I-STAT CG4 LACTIC ACID, ED: LACTIC ACID, VENOUS: 0.73 mmol/L (ref 0.5–1.9)

## 2016-02-24 LAB — MAGNESIUM: Magnesium: 1.9 mg/dL (ref 1.7–2.4)

## 2016-02-24 MED ORDER — BUPROPION HCL ER (SR) 100 MG PO TB12
100.0000 mg | ORAL_TABLET | Freq: Two times a day (BID) | ORAL | Status: DC
  Administered 2016-02-24 – 2016-02-26 (×5): 100 mg via ORAL
  Filled 2016-02-24 (×7): qty 1

## 2016-02-24 MED ORDER — INFLUENZA VAC SPLIT QUAD 0.5 ML IM SUSY
0.5000 mL | PREFILLED_SYRINGE | INTRAMUSCULAR | Status: AC
  Administered 2016-02-25: 0.5 mL via INTRAMUSCULAR
  Filled 2016-02-24: qty 0.5

## 2016-02-24 MED ORDER — ACETAMINOPHEN 325 MG PO TABS
650.0000 mg | ORAL_TABLET | Freq: Four times a day (QID) | ORAL | Status: DC | PRN
  Administered 2016-02-24: 650 mg via ORAL
  Filled 2016-02-24: qty 2

## 2016-02-24 MED ORDER — ATORVASTATIN CALCIUM 20 MG PO TABS
20.0000 mg | ORAL_TABLET | Freq: Every day | ORAL | Status: DC
  Administered 2016-02-24 – 2016-02-25 (×2): 20 mg via ORAL
  Filled 2016-02-24 (×3): qty 1

## 2016-02-24 MED ORDER — PALIPERIDONE ER 6 MG PO TB24
6.0000 mg | ORAL_TABLET | Freq: Every day | ORAL | Status: DC
  Administered 2016-02-24 – 2016-02-25 (×2): 6 mg via ORAL
  Filled 2016-02-24 (×2): qty 1

## 2016-02-24 MED ORDER — LIDOCAINE 4 % EX CREA
TOPICAL_CREAM | Freq: Four times a day (QID) | CUTANEOUS | Status: DC | PRN
  Filled 2016-02-24: qty 5

## 2016-02-24 MED ORDER — ASPIRIN 81 MG PO CHEW
324.0000 mg | CHEWABLE_TABLET | Freq: Once | ORAL | Status: AC
  Administered 2016-02-24: 324 mg via ORAL
  Filled 2016-02-24: qty 4

## 2016-02-24 MED ORDER — HYDRALAZINE HCL 20 MG/ML IJ SOLN
10.0000 mg | INTRAMUSCULAR | Status: DC | PRN
  Administered 2016-02-24 (×2): 10 mg via INTRAVENOUS
  Filled 2016-02-24 (×2): qty 1

## 2016-02-24 MED ORDER — DEXTROSE 5 % IV SOLN
2.0000 g | INTRAVENOUS | Status: DC
  Administered 2016-02-24 – 2016-02-25 (×2): 2 g via INTRAVENOUS
  Filled 2016-02-24 (×2): qty 2

## 2016-02-24 MED ORDER — SODIUM CHLORIDE 0.9 % IV BOLUS (SEPSIS)
1000.0000 mL | Freq: Once | INTRAVENOUS | Status: AC
  Administered 2016-02-24: 1000 mL via INTRAVENOUS

## 2016-02-24 MED ORDER — DEXTROSE 5 % IV SOLN
1.0000 g | INTRAVENOUS | Status: DC
  Filled 2016-02-24: qty 10

## 2016-02-24 MED ORDER — ALBUTEROL SULFATE (2.5 MG/3ML) 0.083% IN NEBU: 2.5000 mg | INHALATION_SOLUTION | RESPIRATORY_TRACT | Status: DC | PRN

## 2016-02-24 MED ORDER — ENOXAPARIN SODIUM 40 MG/0.4ML ~~LOC~~ SOLN
40.0000 mg | SUBCUTANEOUS | Status: DC
  Administered 2016-02-24 – 2016-02-25 (×2): 40 mg via SUBCUTANEOUS
  Filled 2016-02-24 (×4): qty 0.4

## 2016-02-24 MED ORDER — AMLODIPINE BESYLATE 10 MG PO TABS
10.0000 mg | ORAL_TABLET | Freq: Every day | ORAL | Status: DC
Start: 2016-02-24 — End: 2016-02-26
  Administered 2016-02-24 – 2016-02-26 (×3): 10 mg via ORAL
  Filled 2016-02-24: qty 2
  Filled 2016-02-24 (×2): qty 1

## 2016-02-24 MED ORDER — SODIUM CHLORIDE 0.9 % IV SOLN
Freq: Once | INTRAVENOUS | Status: AC
  Administered 2016-02-24: 75 mL/h via INTRAVENOUS

## 2016-02-24 MED ORDER — LORAZEPAM 2 MG/ML IJ SOLN
1.0000 mg | Freq: Once | INTRAMUSCULAR | Status: AC
  Administered 2016-02-24: 1 mg via INTRAVENOUS
  Filled 2016-02-24: qty 1

## 2016-02-24 MED ORDER — HYDRALAZINE HCL 50 MG PO TABS
50.0000 mg | ORAL_TABLET | Freq: Two times a day (BID) | ORAL | Status: DC
  Administered 2016-02-24 – 2016-02-26 (×4): 50 mg via ORAL
  Filled 2016-02-24: qty 2
  Filled 2016-02-24 (×4): qty 1

## 2016-02-24 MED ORDER — POTASSIUM CHLORIDE CRYS ER 20 MEQ PO TBCR
40.0000 meq | EXTENDED_RELEASE_TABLET | ORAL | Status: AC
  Administered 2016-02-24: 40 meq via ORAL
  Filled 2016-02-24: qty 2

## 2016-02-24 MED ORDER — ONDANSETRON HCL 4 MG PO TABS: 4.0000 mg | ORAL_TABLET | Freq: Four times a day (QID) | ORAL | Status: DC | PRN

## 2016-02-24 MED ORDER — ASPIRIN EC 325 MG PO TBEC
325.0000 mg | DELAYED_RELEASE_TABLET | Freq: Every day | ORAL | Status: DC
  Administered 2016-02-24 – 2016-02-25 (×2): 325 mg via ORAL
  Filled 2016-02-24 (×2): qty 1

## 2016-02-24 MED ORDER — DEXTROSE 5 % IV SOLN
1.0000 g | Freq: Once | INTRAVENOUS | Status: AC
  Administered 2016-02-24: 1 g via INTRAVENOUS
  Filled 2016-02-24: qty 10

## 2016-02-24 MED ORDER — ACETAMINOPHEN 650 MG RE SUPP: 650.0000 mg | Freq: Four times a day (QID) | RECTAL | Status: DC | PRN

## 2016-02-24 MED ORDER — ONDANSETRON HCL 4 MG/2ML IJ SOLN: 4.0000 mg | Freq: Four times a day (QID) | INTRAMUSCULAR | Status: DC | PRN

## 2016-02-24 NOTE — Progress Notes (Signed)
Pharmacy Antibiotic Note  Thomas [name] is a 51 y.o. male admitted on 02/23/2016 with UTI.  Pharmacy has been consulted for Rocephin dosing.  Plan: Rocephin 1g IV Q24H.  Pharmacy will sign off.  Height: 6\' 3"  (190.5 cm) Weight: 176 lb (79.8 kg) IBW/kg (Calculated) : 84.5  Temp (24hrs), Avg:99.6 F (37.6 C), Min:99.6 F (37.6 C), Max:99.6 F (37.6 C)   Recent Labs Lab 02/23/16 2224 02/23/16 2245 02/23/16 2323 02/23/16 2333 02/24/16 0230  WBC 9.6  --  10.4  --   --   CREATININE 1.90*  --  1.92* 1.90*  --   LATICACIDVEN  --  1.18  --   --  0.73    Estimated Creatinine Clearance: 53.1 mL/min (by C-G formula based on SCr of 1.9 mg/dL (H)).    No Known Allergies   Thank you for allowing pharmacy to be a part of this patient's care.  Thomas [name], PharmD, BCPS  02/24/2016 6:22 AM

## 2016-02-24 NOTE — Progress Notes (Signed)
PHARMACY - PHYSICIAN COMMUNICATION CRITICAL VALUE ALERT - BLOOD CULTURE IDENTIFICATION (BCID)  Results for orders placed or performed during the hospital encounter of 02/23/16  Blood Culture ID Panel (Reflexed) (Collected: 02/24/2016  2:20 AM)  Result Value Ref Range   Enterococcus species NOT DETECTED NOT DETECTED   Listeria monocytogenes NOT DETECTED NOT DETECTED   Staphylococcus species NOT DETECTED NOT DETECTED   Staphylococcus aureus NOT DETECTED NOT DETECTED   Streptococcus species DETECTED (A) NOT DETECTED   Streptococcus agalactiae NOT DETECTED NOT DETECTED   Streptococcus pneumoniae DETECTED (A) NOT DETECTED   Streptococcus pyogenes NOT DETECTED NOT DETECTED   Acinetobacter baumannii NOT DETECTED NOT DETECTED   Enterobacteriaceae species NOT DETECTED NOT DETECTED   Enterobacter cloacae complex NOT DETECTED NOT DETECTED   Escherichia coli NOT DETECTED NOT DETECTED   Klebsiella oxytoca NOT DETECTED NOT DETECTED   Klebsiella pneumoniae NOT DETECTED NOT DETECTED   Proteus species NOT DETECTED NOT DETECTED   Serratia marcescens NOT DETECTED NOT DETECTED   Haemophilus influenzae NOT DETECTED NOT DETECTED   Neisseria meningitidis NOT DETECTED NOT DETECTED   Pseudomonas aeruginosa NOT DETECTED NOT DETECTED   Candida albicans NOT DETECTED NOT DETECTED   Candida glabrata NOT DETECTED NOT DETECTED   Candida krusei NOT DETECTED NOT DETECTED   Candida parapsilosis NOT DETECTED NOT DETECTED   Candida tropicalis NOT DETECTED NOT DETECTED    Name of physician (or Provider) Contacted: Dr. Toniann FailKakrakandy   Changes to prescribed antibiotics required: increase ceftriaxone to 2 gram IV every 24 hours for now. Await final cx data.   Thomas [name] 02/24/2016  8:20 PM

## 2016-02-24 NOTE — Evaluation (Signed)
Physical Therapy Evaluation Patient Details Name: Thomas [name] MRN: [id number] DOB: 1965-06-10 Today's Date: 02/24/2016   History of Present Illness   Thomas [name] is a 51 y.o. male with medical history significant of HTN, HLD, CVA in 10/2015, bipolar disorder, tobacco abuse; who presents with acute onset of numbness/tingling sensation in the right arm and bilateral legs, associated with a short episode of confusion.   Over the last week patient had intermittently had generalized malaise for which he initially thought that he had the flu. Associated symptoms included intermittent cough, chills, tingling in his face, leg pain, and blurry vision.   Clinical Impression  Pt is close to baseline functioning and should be safe at home with available assist. There are no further acute PT needs. Pt can benefit from a cane on his more unsteady days. Will sign off at this time.     Follow Up Recommendations No PT follow up    Equipment Recommendations  Cane    Recommendations for Other Services       Precautions / Restrictions Precautions Precautions: Fall Restrictions Weight Bearing Restrictions: No      Mobility  Bed Mobility Overal bed mobility: Modified Independent                Transfers Overall transfer level: Modified independent                  Ambulation/Gait Ambulation/Gait assistance: Supervision Ambulation Distance (Feet): 140 Feet (with iv pole and 100 feet with cane) Assistive device: Straight cane Gait Pattern/deviations: Step-through pattern;Step-to pattern   Gait velocity interpretation: Below normal speed for age/gender General Gait Details: Instructed in safe use of the cane and pt practiced, but initially more thrown off by the cane.  Gradually became more steady.  Stairs            Wheelchair Mobility    Modified Rankin (Stroke Patients Only)       Balance Overall balance assessment: Needs assistance   Sitting balance-Leahy  Scale: Good       Standing balance-Leahy Scale: Fair                               Pertinent Vitals/Pain      Home Living Family/patient expects to be discharged to:: Private residence Living Arrangements: Spouse/significant other;Children Available Help at Discharge: Available PRN/intermittently;Family Type of Home: House Home Access: Stairs to enter Entrance Stairs-Rails: Doctor, general practice of Steps: 2 Home Layout: One level   Additional Comments: pt states he doesn't have a cane as thought.    Prior Function Level of Independence: Independent               Hand Dominance        Extremity/Trunk Assessment        Lower Extremity Assessment Lower Extremity Assessment: Overall WFL for tasks assessed;Generalized weakness (R leg noticeably weaker than L LE assuming pt giving 100%)       Communication   Communication: No difficulties  Cognition Arousal/Alertness: Awake/alert Behavior During Therapy: WFL for tasks assessed/performed Overall Cognitive Status: Within Functional Limits for tasks assessed                      General Comments      Exercises     Assessment/Plan    PT Assessment Patent does not need any further PT services  PT Problem List  PT Treatment Interventions      PT Goals (Current goals can be found in the Care Plan section)  Acute Rehab PT Goals Patient Stated Goal: I think I'll be good with a cane PT Goal Formulation: With patient    Frequency     Barriers to discharge        Co-evaluation               End of Session   Activity Tolerance: Patient tolerated treatment well Patient left: in bed;with call bell/phone within reach Nurse Communication: Mobility status    Functional Assessment Tool Used: clinical judgement    Time: 1707-1730 PT Time Calculation (min) (ACUTE ONLY): 23 min   Charges:   PT Evaluation $PT Eval Moderate Complexity: 1 Procedure PT  Treatments $Gait Training: 8-22 mins   PT G Codes:   PT G-Codes **NOT FOR INPATIENT CLASS** Functional Assessment Tool Used: clinical judgement    Eliseo GumKenneth V Chantrice Hagg 02/24/2016, 6:06 PM  02/24/2016  [place] BingKen Esra Frankowski, PT [phone]  (pager)
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
## 2016-02-24 NOTE — ED Notes (Signed)
Delay in lab draw,  Pt currently in MRI 

## 2016-02-24 NOTE — ED Notes (Signed)
Phlebotomy at bedside.

## 2016-02-24 NOTE — ED Notes (Signed)
Lunch tray has been ordered.
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
## 2016-02-24 NOTE — ED Notes (Signed)
Patient transported to MRI 

## 2016-02-24 NOTE — ED Notes (Signed)
CRITICAL VALUE ALERT  Critical value received:  0.04

## 2016-02-24 NOTE — Progress Notes (Signed)
PROGRESS NOTE    Thomas [name]  ZOX:[id number] DOB: 1965-10-06 DOA: 02/23/2016 PCP: Dorrene German, MD    Brief Narrative:  51 yo male presents with confusion and disorientation with lower extremity paresthesias. Initial workup for CVA was negative, patient tested positive for THC. Urine analysis positive for UTI. Admitted for IV antibiotics.    Assessment & Plan:   Principal Problem:   UTI (urinary tract infection) Active Problems:   TOBACCO ABUSE   HYPERTENSION, BENIGN ESSENTIAL   AKI (acute kidney injury) (HCC)   Acute encephalopathy   Chest pain   1. Urine infection. Will continue antibiotic therapy with ceftriaxone, will follow on cell count and temperature curve, follow on cultures.   2. Metabolic encephalopathy. Clinically improved, will continue supportive IV fluids and neuro checks per unit protocol.   3. AKI. Cr at 1.5, will continue hydration with saline, follow on renal panel in am, avoid hypotension or nephrotoxic medications.   4. History of CVA. Nonfocal, no signs of recurrence  5. Hypokalemia. Follow on K in am after repletion.    DVT prophylaxis: enoxaparin  Code Status: full  Family Communication: no family at the bedside  Disposition Plan: Home   Consultants:     Procedures:    Antimicrobials:   cefriaxone   Subjective: Patient feeling week and tired, no nausea or vomiting no chest pain, nausea or vomiting.   Objective: Vitals:   02/24/16 1230 02/24/16 1245 02/24/16 1354 02/24/16 1515  BP: (!) 165/120 (!) 171/108 (!) 157/106 (!) 176/107  Pulse: 77 78 81 82  Resp: 16 16 18 18   Temp:    (!) 100.6 F (38.1 C)  TempSrc:    Oral  SpO2: 95% 97% 98% 100%  Weight:    88 kg (194 lb)  Height:        Intake/Output Summary (Last 24 hours) at 02/24/16 1548 Last data filed at 02/24/16 1113  Gross per 24 hour  Intake             1050 ml  Output             1100 ml  Net              -50 ml   Filed Weights   02/24/16 0228 02/24/16 1515    Weight: 79.8 kg (176 lb) 88 kg (194 lb)    Examination:  General exam: deconditioned E ENT: mild pallor, no icterus, oral mucosa moist.  Respiratory system: Clear to auscultation. Respiratory effort normal. No wheezing or rales.  Cardiovascular system: S1 & S2 heard, RRR. No JVD, murmurs, rubs, gallops or clicks. No pedal edema. Gastrointestinal system: Abdomen is nondistended, soft and nontender. No organomegaly or masses felt. Normal bowel sounds heard. Central nervous system: Alert and oriented. No focal neurological deficits. Extremities: Symmetric 5 x 5 power. Skin: No rashes, lesions or ulcers  Data Reviewed: I have personally reviewed following labs and imaging studies  CBC:  Recent Labs Lab 02/23/16 2224 02/23/16 2323 02/23/16 2333 02/24/16 0757  WBC 9.6 10.4  --  8.8  NEUTROABS  --  6.5  --   --   HGB 15.8 15.6 15.6 14.8  HCT 45.8 44.8 46.0 42.6  MCV 87.4 86.5  --  87.7  PLT 181 146*  --  143*   Basic Metabolic Panel:  Recent Labs Lab 02/23/16 2224 02/23/16 2323 02/23/16 2333 02/24/16 0615 02/24/16 0757  NA 136 135 137  --  137  K 3.1* 3.2* 3.1*  --  3.7  CL 98* 100* 101  --  99*  CO2 26 23  --   --  24  GLUCOSE 102* 125* 124*  --  128*  BUN 15 16 19   --  15  CREATININE 1.90* 1.92* 1.90*  --  1.56*  CALCIUM 9.5 9.3  --   --  9.0  MG  --   --   --  1.9  --    GFR: Estimated Creatinine Clearance: 68.5 mL/min (by C-G formula based on SCr of 1.56 mg/dL (H)). Liver Function Tests:  Recent Labs Lab 02/23/16 2224 02/23/16 2323  AST 64* 61*  ALT 88* 85*  ALKPHOS 76 71  BILITOT 1.6* 1.8*  PROT 8.4* 8.2*  ALBUMIN 3.8 3.7   No results for input(s): LIPASE, AMYLASE in the last 168 hours. No results for input(s): AMMONIA in the last 168 hours. Coagulation Profile:  Recent Labs Lab 02/23/16 2323  INR 1.21   Cardiac Enzymes:  Recent Labs Lab 02/23/16 2323 02/24/16 0615 02/24/16 1224  TROPONINI 0.04* 0.05* 0.04*   BNP (last 3 results) No  results for input(s): PROBNP in the last 8760 hours. HbA1C: No results for input(s): HGBA1C in the last 72 hours. CBG:  Recent Labs Lab 02/23/16 2231 02/23/16 2325  GLUCAP 111* 125*   Lipid Profile: No results for input(s): CHOL, HDL, LDLCALC, TRIG, CHOLHDL, LDLDIRECT in the last 72 hours. Thyroid Function Tests: No results for input(s): TSH, T4TOTAL, FREET4, T3FREE, THYROIDAB in the last 72 hours. Anemia Panel: No results for input(s): VITAMINB12, FOLATE, FERRITIN, TIBC, IRON, RETICCTPCT in the last 72 hours. Sepsis Labs:  Recent Labs Lab 02/23/16 2245 02/24/16 0230  LATICACIDVEN 1.18 0.73    No results found for this or any previous visit (from the past 240 hour(s)).       Radiology Studies: Mr Angiogram Head Wo Contrast  Result Date: 02/24/2016 CLINICAL DATA:  Acute onset RIGHT upper and bilateral lower extremity tingling. Last seen normal at 10 p.m. transient chest pain, abdominal pain while driving. History of hypertension, stroke. EXAM: MRI HEAD WITHOUT CONTRAST MRA HEAD WITHOUT CONTRAST TECHNIQUE: Multiplanar, multiecho pulse sequences of the brain and surrounding structures were obtained without intravenous contrast. Angiographic images of the head were obtained using MRA technique without contrast. COMPARISON:  MRI and MRA head November 07, 2015. CT HEAD February 23, 2016. FINDINGS: MRI HEAD FINDINGS- moderately motion degraded examination. BRAIN: No reduced diffusion to suggest acute ischemia. No susceptibility artifact to suggest hemorrhage. The ventricles and sulci are normal for patient's age. Old small bilateral cerebellar infarcts. Unchanged linear bright FLAIR T2 hyperintense signal LEFT frontal lobe could be secondary to developmental venous anomaly. A few scattered subcentimeter supratentorial white matter FLAIR T2 hyperintensities. No suspicious parenchymal signal, masses or mass effect. No abnormal extra-axial fluid collections. VASCULAR: Normal major intracranial  vascular flow voids present at skull base. SKULL AND UPPER CERVICAL SPINE: No abnormal sellar expansion. No suspicious calvarial bone marrow signal. Craniocervical junction maintained. SINUSES/ORBITS: Severe LEFT maxillary sinusitis. Mild to moderate pan paranasal sinus mucosal thickening. Mastoid air cells are well aerated. The included ocular globes and orbital contents are non-suspicious. OTHER: None. MRA HEAD FINDINGS- moderately motion degraded examination. ANTERIOR CIRCULATION: Normal flow related enhancement of the included cervical, petrous, cavernous and supraclinoid internal carotid arteries. Patent anterior communicating artery. Flow related enhancement of the anterior and middle cerebral arteries, mild to moderate tandem stenoses anterior cerebral arteries and middle cerebral arteries. No large vessel occlusion, high-grade stenosis, abnormal luminal irregularity, aneurysm. POSTERIOR CIRCULATION:  Codominant vertebral artery's. Basilar artery is patent, with normal flow related enhancement of the main branch vessels. Normal flow related enhancement of the posterior cerebral arteries. Small RIGHT and robust LEFT posterior communicating artery is present. No large vessel occlusion, high-grade stenosis, abnormal luminal irregularity, aneurysm. ANATOMIC VARIANTS: None. IMPRESSION: MRI HEAD: No acute intracranial process on this moderately motion degraded examination. Stable examination including old small cerebellar infarcts and mild chronic small vessel ischemic disease. MRA HEAD: No emergent large vessel occlusion or severe stenosis on this moderately motion degraded examination. Mild to moderate anterior and middle cerebral artery tandem stenosis could be secondary to atherosclerosis or motion artifact. Electronically Signed   By: Awilda Metroourtnay  Bloomer M.D.   On: 02/24/2016 02:15   Mr Brain Wo Contrast  Result Date: 02/24/2016 CLINICAL DATA:  Acute onset RIGHT upper and bilateral lower extremity tingling.  Last seen normal at 10 p.m. transient chest pain, abdominal pain while driving. History of hypertension, stroke. EXAM: MRI HEAD WITHOUT CONTRAST MRA HEAD WITHOUT CONTRAST TECHNIQUE: Multiplanar, multiecho pulse sequences of the brain and surrounding structures were obtained without intravenous contrast. Angiographic images of the head were obtained using MRA technique without contrast. COMPARISON:  MRI and MRA head November 07, 2015. CT HEAD February 23, 2016. FINDINGS: MRI HEAD FINDINGS- moderately motion degraded examination. BRAIN: No reduced diffusion to suggest acute ischemia. No susceptibility artifact to suggest hemorrhage. The ventricles and sulci are normal for patient's age. Old small bilateral cerebellar infarcts. Unchanged linear bright FLAIR T2 hyperintense signal LEFT frontal lobe could be secondary to developmental venous anomaly. A few scattered subcentimeter supratentorial white matter FLAIR T2 hyperintensities. No suspicious parenchymal signal, masses or mass effect. No abnormal extra-axial fluid collections. VASCULAR: Normal major intracranial vascular flow voids present at skull base. SKULL AND UPPER CERVICAL SPINE: No abnormal sellar expansion. No suspicious calvarial bone marrow signal. Craniocervical junction maintained. SINUSES/ORBITS: Severe LEFT maxillary sinusitis. Mild to moderate pan paranasal sinus mucosal thickening. Mastoid air cells are well aerated. The included ocular globes and orbital contents are non-suspicious. OTHER: None. MRA HEAD FINDINGS- moderately motion degraded examination. ANTERIOR CIRCULATION: Normal flow related enhancement of the included cervical, petrous, cavernous and supraclinoid internal carotid arteries. Patent anterior communicating artery. Flow related enhancement of the anterior and middle cerebral arteries, mild to moderate tandem stenoses anterior cerebral arteries and middle cerebral arteries. No large vessel occlusion, high-grade stenosis, abnormal  luminal irregularity, aneurysm. POSTERIOR CIRCULATION: Codominant vertebral artery's. Basilar artery is patent, with normal flow related enhancement of the main branch vessels. Normal flow related enhancement of the posterior cerebral arteries. Small RIGHT and robust LEFT posterior communicating artery is present. No large vessel occlusion, high-grade stenosis, abnormal luminal irregularity, aneurysm. ANATOMIC VARIANTS: None. IMPRESSION: MRI HEAD: No acute intracranial process on this moderately motion degraded examination. Stable examination including old small cerebellar infarcts and mild chronic small vessel ischemic disease. MRA HEAD: No emergent large vessel occlusion or severe stenosis on this moderately motion degraded examination. Mild to moderate anterior and middle cerebral artery tandem stenosis could be secondary to atherosclerosis or motion artifact. Electronically Signed   By: Awilda Metroourtnay  Bloomer M.D.   On: 02/24/2016 02:15   Ct Angio Chest/abd/pel For Dissection W And/or Wo Contrast  Result Date: 02/24/2016 CLINICAL DATA:  Generalized chest pain and right-sided weakness. Onset at 22:00. EXAM: CT ANGIOGRAPHY CHEST, ABDOMEN AND PELVIS TECHNIQUE: Multidetector CT imaging through the chest, abdomen and pelvis was performed using the standard protocol during bolus administration of intravenous contrast. Multiplanar reconstructed images and MIPs were obtained  and reviewed to evaluate the vascular anatomy. CONTRAST:  80 mL Isovue 370 intravenous COMPARISON:  None. FINDINGS: CTA CHEST FINDINGS Cardiovascular: Preferential opacification of the thoracic aorta. No aneurysm or dissection. Standard 3 vessel anatomy of the great vessel origins. Proximal great vessels are widely patent. Little or no plaque is visible in the thoracic aorta. Minimal calcified plaque visible in the left anterior descending coronary artery. Mediastinum/Nodes: No enlarged mediastinal, hilar, or axillary lymph nodes. Thyroid gland,  trachea, and esophagus demonstrate no significant findings. Lungs/Pleura: Lungs are clear. No pleural effusion or pneumothorax. Musculoskeletal: No chest wall abnormality. No acute or significant osseous findings. Review of the MIP images confirms the above findings. CTA ABDOMEN AND PELVIS FINDINGS VASCULAR Aorta: Normal caliber aorta with mild atherosclerotic calcification. No dissection or stenosis. Celiac: Patent without evidence of aneurysm, dissection, vasculitis or significant stenosis. SMA: Patent without evidence of aneurysm, dissection, vasculitis or significant stenosis. Renals: Both renal arteries are patent without evidence of aneurysm, dissection, vasculitis, fibromuscular dysplasia or significant stenosis. IMA: Patent without evidence of aneurysm, dissection, vasculitis or significant stenosis. Inflow: Patent without evidence of aneurysm, dissection, vasculitis or significant stenosis. Mild atherosclerotic calcification, particularly in the common iliac arteries and at the iliac bifurcations, non stenotic. Veins: No obvious venous abnormality within the limitations of this arterial phase study. Review of the MIP images confirms the above findings. NON-VASCULAR Hepatobiliary: Unremarkable arterial phase appearances of the liver. Gallbladder and bile ducts are unremarkable. Pancreas: Unremarkable. No pancreatic ductal dilatation or surrounding inflammatory changes. Spleen: Normal in size without focal abnormality. Adrenals/Urinary Tract: Adrenal glands are unremarkable. Kidneys are normal, without renal calculi, focal lesion, or hydronephrosis. Bladder is unremarkable. Stomach/Bowel: Stomach, small bowel and colon appear unremarkable. Normal appendix. Lymphatic: No pathologic adenopathy in the abdomen or pelvis. Reproductive: Unremarkable Other: No acute inflammation.  No ascites. Musculoskeletal: No significant skeletal lesion. Review of the MIP images confirms the above findings. IMPRESSION: 1. Fatty  aorta is normal in caliber throughout its length with only mild atherosclerotic calcification the abdomen. No aneurysm, dissection or stenosis. 2. Major branches of the aorta are widely patent, without stenosis or dissection. 3. Mild coronary artery calcification, predominantly in the LAD. 4. No acute findings are evident in the chest, abdomen or pelvis. Electronically Signed   By: Ellery Plunk M.D.   On: 02/24/2016 00:02   Ct Head Code Stroke W/o Cm  Result Date: 02/23/2016 CLINICAL DATA:  Code stroke. Weakness, confusion and hallucinations. RIGHT-sided weakness. Last seen normal at 10 p.m. History of hypertension, hyperlipidemia and stroke. EXAM: CT HEAD WITHOUT CONTRAST TECHNIQUE: Contiguous axial images were obtained from the base of the skull through the vertex without intravenous contrast. COMPARISON:  MRI of the head November 07, 2015 FINDINGS: BRAIN: The ventricles and sulci are normal. No intraparenchymal hemorrhage, mass effect nor midline shift. Old small cerebellar infarcts. 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 No acute large vascular territory infarcts. No abnormal extra-axial fluid collections. Basal cisterns are patent. VASCULAR: Trace calcific atherosclerosis the carotid siphons. SKULL/SOFT TISSUES: No skull fracture. No significant soft tissue swelling. ORBITS/SINUSES: The included ocular globes and orbital contents are normal.Severe LEFT maxillary sinusitis, moderate remaining paranasal sinusitis. Mastoid air cells are well aerated. OTHER: Multiple dental caries and periapical abscesses. ASPECTS [facility] Stroke Program Early CT Score) - Ganglionic level infarction (caudate, lentiform nuclei, internal capsule, insula, M1-M3 cortex): 7 - Supraganglionic infarction (M4-M6 cortex): 3 Total score (0-10 with 10 being normal): 10 IMPRESSION: 1. No acute intracranial  process. Old cerebellar infarcts. Moderate to severe pan  paranasal sinusitis. 2. ASPECTS is 10. Critical Value/emergent results were called by telephone at the time of interpretation on 02/23/2016 at 11:40 pm to Dr. Otelia Limes, Neurology, who verbally acknowledged these results. Electronically Signed   By: Awilda Metro M.D.   On: 02/23/2016 23:43        Scheduled Meds: . amLODipine  10 mg Oral Daily  . aspirin EC  325 mg Oral Daily  . buPROPion  100 mg Oral BID WC  . [START ON 02/25/2016] cefTRIAXone (ROCEPHIN)  IV  1 g Intravenous Q24H  . enoxaparin (LOVENOX) injection  40 mg Subcutaneous Q24H  . hydrALAZINE  50 mg Oral BID  . paliperidone  6 mg Oral QHS   Continuous Infusions:   LOS: 0 days     Corbet Hanley Annett Gula, MD [facility] Pager [phone]  If 7PM-7AM, please contact night-coverage [email or web address] Password [email or web address] 02/24/2016, 3:48 PM

## 2016-02-24 NOTE — Progress Notes (Addendum)
*  PRELIMINARY RESULTS* Vascular Ultrasound Carotid Duplex (Doppler) has been completed.  Preliminary findings: No hemodynamically significant stenosis seen bilaterally, no plaque visualized on this exam. Antegrade vertebral flow bilaterally.   Tempie DonningCharlotte C Lashayla Armes 02/24/2016, 1:16 PM

## 2016-02-24 NOTE — ED Notes (Signed)
Vascular at bedside

## 2016-02-24 NOTE — Progress Notes (Signed)
  Echocardiogram 2D Echocardiogram has been performed.  Thomas [name], Thomas [name] 02/24/2016, 12:23 PM

## 2016-02-24 NOTE — ED Notes (Signed)
Vascular on way to transport patient for US of carotid.

## 2016-02-24 NOTE — ED Notes (Signed)
Delay in lab draw,  Pt not in room 
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
## 2016-02-24 NOTE — ED Notes (Signed)
ED Provider at bedside. 

## 2016-02-25 ENCOUNTER — Inpatient Hospital Stay (HOSPITAL_COMMUNITY): Payer: Medicaid Other

## 2016-02-25 DIAGNOSIS — N3000 Acute cystitis without hematuria: Secondary | ICD-10-CM

## 2016-02-25 LAB — BASIC METABOLIC PANEL
Anion gap: 8 (ref 5–15)
BUN: 11 mg/dL (ref 6–20)
CO2: 24 mmol/L (ref 22–32)
Calcium: 8.9 mg/dL (ref 8.9–10.3)
Chloride: 104 mmol/L (ref 101–111)
Creatinine, Ser: 1.28 mg/dL — ABNORMAL HIGH (ref 0.61–1.24)
GFR calc Af Amer: >60 mL/min (ref >60)
GLUCOSE: 84 mg/dL (ref 65–99)
POTASSIUM: 3.4 mmol/L — AB (ref 3.5–5.1)
Sodium: 136 mmol/L (ref 135–145)

## 2016-02-25 LAB — CBC WITH DIFFERENTIAL/PLATELET
BASOS ABS: 0 10*3/uL (ref 0.0–0.1)
Basophils Relative: 0 %
EOS ABS: 0.1 10*3/uL (ref 0.0–0.7)
Eosinophils Relative: 2 %
HEMATOCRIT: 41.7 % (ref 39.0–52.0)
HEMOGLOBIN: 14.4 g/dL (ref 13.0–17.0)
Lymphocytes Relative: 38 %
Lymphs Abs: 1.8 10*3/uL (ref 0.7–4.0)
MCH: 29.9 pg (ref 26.0–34.0)
MCHC: 34.5 g/dL (ref 30.0–36.0)
MCV: 86.7 fL (ref 78.0–100.0)
MONOS PCT: 11 %
Monocytes Absolute: 0.5 10*3/uL (ref 0.1–1.0)
NEUTROS ABS: 2.4 10*3/uL (ref 1.7–7.7)
NEUTROS PCT: 49 %
PLATELETS: 161 10*3/uL (ref 150–400)
RBC: 4.81 MIL/uL (ref 4.22–5.81)
RDW: 14.2 % (ref 11.5–15.5)
WBC: 4.8 10*3/uL (ref 4.0–10.5)

## 2016-02-25 LAB — URINE CULTURE

## 2016-02-25 MED ORDER — HYDROCHLOROTHIAZIDE 25 MG PO TABS
25.0000 mg | ORAL_TABLET | Freq: Every day | ORAL | Status: DC
  Administered 2016-02-25 – 2016-02-26 (×2): 25 mg via ORAL
  Filled 2016-02-25 (×2): qty 1

## 2016-02-25 NOTE — Progress Notes (Signed)
PROGRESS NOTE    BRADIE LACOCK  OZH:[id number] DOB: 12/14/1965 DOA: 02/23/2016 PCP: Dorrene German, MD   Brief Narrative:  51 yo male presents with confusion and disorientation with lower extremity paresthesias. Initial workup for CVA was negative, patient tested positive for THC. Urine analysis positive for UTI. Admitted for IV antibiotics. Positive blood culture for streptococcus pneumoniae. Follow on chest film.    Assessment & Plan:   Principal Problem:   Urinary tract infection without hematuria Active Problems:   TOBACCO ABUSE   HYPERTENSION, BENIGN ESSENTIAL   AKI (acute kidney injury) (HCC)   Altered mental status   Acute encephalopathy   Chest pain   History of stroke  1. Urine infection. Blood culture positive for streptococcus pneumonia, will continue antibiotic therapy with ceftriaxone. Urine culture with multiple species. Wbc down to 4,8 from 8,8, patient has remained afebrile. Will follow on repeat blood cultures. Renal US with no obstructive uropathy.   2. Metabolic encephalopathy. Patient non focal and back to his baseline. Will continue supportive IV fluids and neuro checks per unit protocol. Out of bed as tolerated.   3. AKI. Renal function with improved cr at 1.2 from 1,56. Patient tolerating po well, will hold on IV fluids and will follow on renal panel in am, k at 3,4 and serum bicarbonate at 24.   4. History of CVA. Nonfocal, no signs of recurrence. Will continue blood pressure control.   5. Hypokalemia. K at 3,4. Patient tolerating po well, will follow on renal panel in am.  7. Hypertension. Will resume hctz and will continue to hold lisinopril for now, due to risk of worsening renal function. Continue amlodipine and hydralazine.  8. Streptococcal pneumonia bacteremia. Follow chest film with opacity on the left base, likely atelectasis, will continue to follow cell count and temperature curve.   Continue paliperidone for psych.  DVT prophylaxis:  enoxaparin  Code Status: full  Family Communication: no family at the bedside  Disposition Plan: Home   Consultants:     Procedures:    Antimicrobials:   cefriaxone  Subjective: Patient feeling better, no nausea or vomiting, no fevers, blood pressure has been elevated, patient on multiple antihypertensive medications at home. Per patient's family at the bedside, patient had dyspnea and fevers at home with profuse diaphoresis.   Objective: Vitals:   02/24/16 1354 02/24/16 1515 02/24/16 2100 02/25/16 0519  BP: (!) 157/106 (!) 176/107 (!) 138/92 (!) 137/92  Pulse: 81 82 71 66  Resp: 18 18 18 18   Temp:  (!) 100.6 F (38.1 C) 97.7 F (36.5 C) 97.8 F (36.6 C)  TempSrc:  Oral Oral Oral  SpO2: 98% 100% 100% 99%  Weight:  88 kg (194 lb) 88.3 kg (194 lb 9.6 oz)   Height:        Intake/Output Summary (Last 24 hours) at 02/25/16 0918 Last data filed at 02/25/16 0600  Gross per 24 hour  Intake              750 ml  Output             1800 ml  Net            -1050 ml   Filed Weights   02/24/16 0228 02/24/16 1515 02/24/16 2100  Weight: 79.8 kg (176 lb) 88 kg (194 lb) 88.3 kg (194 lb 9.6 oz)    Examination:  General exam: not in pain or dyspnea E ENT:no pallor or icterus, oral mucosa moist.  Respiratory system: Clear  to auscultation. Respiratory effort normal. Mild rales at bases, more left than right, no wheezing or rhonchi.  Cardiovascular system: S1 & S2 heard, RRR. No JVD, murmurs, rubs, gallops or clicks. No pedal edema. Gastrointestinal system: Abdomen is nondistended, soft and nontender. No organomegaly or masses felt. Normal bowel sounds heard. Central nervous system: Alert and oriented. No focal neurological deficits. Extremities: Symmetric 5 x 5 power. Skin: No rashes, lesions or ulcers    Data Reviewed: I have personally reviewed following labs and imaging studies  CBC:  Recent Labs Lab 02/23/16 2224 02/23/16 2323 02/23/16 2333 02/24/16 0757  02/25/16 0438  WBC 9.6 10.4  --  8.8 4.8  NEUTROABS  --  6.5  --   --  2.4  HGB 15.8 15.6 15.6 14.8 14.4  HCT 45.8 44.8 46.0 42.6 41.7  MCV 87.4 86.5  --  87.7 86.7  PLT 181 146*  --  143* 161   Basic Metabolic Panel:  Recent Labs Lab 02/23/16 2224 02/23/16 2323 02/23/16 2333 02/24/16 0615 02/24/16 0757 02/25/16 0438  NA 136 135 137  --  137 136  K 3.1* 3.2* 3.1*  --  3.7 3.4*  CL 98* 100* 101  --  99* 104  CO2 26 23  --   --  24 24  GLUCOSE 102* 125* 124*  --  128* 84  BUN 15 16 19   --  15 11  CREATININE 1.90* 1.92* 1.90*  --  1.56* 1.28*  CALCIUM 9.5 9.3  --   --  9.0 8.9  MG  --   --   --  1.9  --   --    GFR: Estimated Creatinine Clearance: 83.4 mL/min (by C-G formula based on SCr of 1.28 mg/dL (H)). Liver Function Tests:  Recent Labs Lab 02/23/16 2224 02/23/16 2323  AST 64* 61*  ALT 88* 85*  ALKPHOS 76 71  BILITOT 1.6* 1.8*  PROT 8.4* 8.2*  ALBUMIN 3.8 3.7   No results for input(s): LIPASE, AMYLASE in the last 168 hours. No results for input(s): AMMONIA in the last 168 hours. Coagulation Profile:  Recent Labs Lab 02/23/16 2323  INR 1.21   Cardiac Enzymes:  Recent Labs Lab 02/23/16 2323 02/24/16 0615 02/24/16 1224  TROPONINI 0.04* 0.05* 0.04*   BNP (last 3 results) No results for input(s): PROBNP in the last 8760 hours. HbA1C: No results for input(s): HGBA1C in the last 72 hours. CBG:  Recent Labs Lab 02/23/16 2231 02/23/16 2325  GLUCAP 111* 125*   Lipid Profile: No results for input(s): CHOL, HDL, LDLCALC, TRIG, CHOLHDL, LDLDIRECT in the last 72 hours. Thyroid Function Tests: No results for input(s): TSH, T4TOTAL, FREET4, T3FREE, THYROIDAB in the last 72 hours. Anemia Panel: No results for input(s): VITAMINB12, FOLATE, FERRITIN, TIBC, IRON, RETICCTPCT in the last 72 hours. Sepsis Labs:  Recent Labs Lab 02/23/16 2245 02/24/16 0230  LATICACIDVEN 1.18 0.73    Recent Results (from the past 240 hour(s))  Urine culture      Status: Abnormal   Collection Time: 02/23/16 12:18 AM  Result Value Ref Range Status   Specimen Description URINE, RANDOM  Final   Special Requests ADDED [id number] [phone]  Final   Culture MULTIPLE SPECIES PRESENT, SUGGEST RECOLLECTION (A)  Final   Report Status 02/25/2016 FINAL  Final  Blood culture (routine x 2)     Status: Abnormal (Preliminary result)   Collection Time: 02/24/16  2:20 AM  Result Value Ref Range Status   Specimen Description BLOOD RIGHT ARM  Final   Special Requests BOTTLES DRAWN AEROBIC AND ANAEROBIC  Final   Culture  Setup Time   Final    GRAM POSITIVE COCCI IN CHAINS IN PAIRS ANAEROBIC BOTTLE ONLY CRITICAL RESULT CALLED TO, READ BACK BY AND VERIFIED WITH: Lytle Butte PHARMD 2014 02/24/16 A BROWNING    Culture (A)  Final    STREPTOCOCCUS PNEUMONIAE SUSCEPTIBILITIES TO FOLLOW    Report Status PENDING  Incomplete  Blood Culture ID Panel (Reflexed)     Status: Abnormal   Collection Time: 02/24/16  2:20 AM  Result Value Ref Range Status   Enterococcus species NOT DETECTED NOT DETECTED Final   Listeria monocytogenes NOT DETECTED NOT DETECTED Final   Staphylococcus species NOT DETECTED NOT DETECTED Final   Staphylococcus aureus NOT DETECTED NOT DETECTED Final   Streptococcus species DETECTED (A) NOT DETECTED Final    Comment: CRITICAL RESULT CALLED TO, READ BACK BY AND VERIFIED WITH: Lytle Butte PHARMD 2014 02/24/16 A BROWNING    Streptococcus agalactiae NOT DETECTED NOT DETECTED Final   Streptococcus pneumoniae DETECTED (A) NOT DETECTED Final    Comment: CRITICAL RESULT CALLED TO, READ BACK BY AND VERIFIED WITH: Lytle Butte PHARMD 2014 02/24/16 A BROWNING    Streptococcus pyogenes NOT DETECTED NOT DETECTED Final   Acinetobacter baumannii NOT DETECTED NOT DETECTED Final   Enterobacteriaceae species NOT DETECTED NOT DETECTED Final   Enterobacter cloacae complex NOT DETECTED NOT DETECTED Final   Escherichia coli NOT DETECTED NOT DETECTED Final   Klebsiella oxytoca NOT DETECTED  NOT DETECTED Final   Klebsiella pneumoniae NOT DETECTED NOT DETECTED Final   Proteus species NOT DETECTED NOT DETECTED Final   Serratia marcescens NOT DETECTED NOT DETECTED Final   Haemophilus influenzae NOT DETECTED NOT DETECTED Final   Neisseria meningitidis NOT DETECTED NOT DETECTED Final   Pseudomonas aeruginosa NOT DETECTED NOT DETECTED Final   Candida albicans NOT DETECTED NOT DETECTED Final   Candida glabrata NOT DETECTED NOT DETECTED Final   Candida krusei NOT DETECTED NOT DETECTED Final   Candida parapsilosis NOT DETECTED NOT DETECTED Final   Candida tropicalis NOT DETECTED NOT DETECTED Final         Radiology Studies: Mr Angiogram Head Wo Contrast  Result Date: 02/24/2016 CLINICAL DATA:  Acute onset RIGHT upper and bilateral lower extremity tingling. Last seen normal at 10 p.m. transient chest pain, abdominal pain while driving. History of hypertension, stroke. EXAM: MRI HEAD WITHOUT CONTRAST MRA HEAD WITHOUT CONTRAST TECHNIQUE: Multiplanar, multiecho pulse sequences of the brain and surrounding structures were obtained without intravenous contrast. Angiographic images of the head were obtained using MRA technique without contrast. COMPARISON:  MRI and MRA head November 07, 2015. CT HEAD February 23, 2016. FINDINGS: MRI HEAD FINDINGS- moderately motion degraded examination. BRAIN: No reduced diffusion to suggest acute ischemia. No susceptibility artifact to suggest hemorrhage. The ventricles and sulci are normal for patient's age. Old small bilateral cerebellar infarcts. Unchanged linear bright FLAIR T2 hyperintense signal LEFT frontal lobe could be secondary to developmental venous anomaly. A few scattered subcentimeter supratentorial white matter FLAIR T2 hyperintensities. No suspicious parenchymal signal, masses or mass effect. No abnormal extra-axial fluid collections. VASCULAR: Normal major intracranial vascular flow voids present at skull base. SKULL AND UPPER CERVICAL SPINE: No  abnormal sellar expansion. No suspicious calvarial bone marrow signal. Craniocervical junction maintained. SINUSES/ORBITS: Severe LEFT maxillary sinusitis. Mild to moderate pan paranasal sinus mucosal thickening. Mastoid air cells are well aerated. The included ocular globes and orbital contents are non-suspicious. OTHER: None. MRA HEAD  FINDINGS- moderately motion degraded examination. ANTERIOR CIRCULATION: Normal flow related enhancement of the included cervical, petrous, cavernous and supraclinoid internal carotid arteries. Patent anterior communicating artery. Flow related enhancement of the anterior and middle cerebral arteries, mild to moderate tandem stenoses anterior cerebral arteries and middle cerebral arteries. No large vessel occlusion, high-grade stenosis, abnormal luminal irregularity, aneurysm. POSTERIOR CIRCULATION: Codominant vertebral artery's. Basilar artery is patent, with normal flow related enhancement of the main branch vessels. Normal flow related enhancement of the posterior cerebral arteries. Small RIGHT and robust LEFT posterior communicating artery is present. No large vessel occlusion, high-grade stenosis, abnormal luminal irregularity, aneurysm. ANATOMIC VARIANTS: None. IMPRESSION: MRI HEAD: No acute intracranial process on this moderately motion degraded examination. Stable examination including old small cerebellar infarcts and mild chronic small vessel ischemic disease. MRA HEAD: No emergent large vessel occlusion or severe stenosis on this moderately motion degraded examination. Mild to moderate anterior and middle cerebral artery tandem stenosis could be secondary to atherosclerosis or motion artifact. Electronically Signed   By: Awilda Metro M.D.   On: 02/24/2016 02:15   Mr Brain Wo Contrast  Result Date: 02/24/2016 CLINICAL DATA:  Acute onset RIGHT upper and bilateral lower extremity tingling. Last seen normal at 10 p.m. transient chest pain, abdominal pain while driving.  History of hypertension, stroke. EXAM: MRI HEAD WITHOUT CONTRAST MRA HEAD WITHOUT CONTRAST TECHNIQUE: Multiplanar, multiecho pulse sequences of the brain and surrounding structures were obtained without intravenous contrast. Angiographic images of the head were obtained using MRA technique without contrast. COMPARISON:  MRI and MRA head November 07, 2015. CT HEAD February 23, 2016. FINDINGS: MRI HEAD FINDINGS- moderately motion degraded examination. BRAIN: No reduced diffusion to suggest acute ischemia. No susceptibility artifact to suggest hemorrhage. The ventricles and sulci are normal for patient's age. Old small bilateral cerebellar infarcts. Unchanged linear bright FLAIR T2 hyperintense signal LEFT frontal lobe could be secondary to developmental venous anomaly. A few scattered subcentimeter supratentorial white matter FLAIR T2 hyperintensities. No suspicious parenchymal signal, masses or mass effect. No abnormal extra-axial fluid collections. VASCULAR: Normal major intracranial vascular flow voids present at skull base. SKULL AND UPPER CERVICAL SPINE: No abnormal sellar expansion. No suspicious calvarial bone marrow signal. Craniocervical junction maintained. SINUSES/ORBITS: Severe LEFT maxillary sinusitis. Mild to moderate pan paranasal sinus mucosal thickening. Mastoid air cells are well aerated. The included ocular globes and orbital contents are non-suspicious. OTHER: None. MRA HEAD FINDINGS- moderately motion degraded examination. ANTERIOR CIRCULATION: Normal flow related enhancement of the included cervical, petrous, cavernous and supraclinoid internal carotid arteries. Patent anterior communicating artery. Flow related enhancement of the anterior and middle cerebral arteries, mild to moderate tandem stenoses anterior cerebral arteries and middle cerebral arteries. No large vessel occlusion, high-grade stenosis, abnormal luminal irregularity, aneurysm. POSTERIOR CIRCULATION: Codominant vertebral artery's.  Basilar artery is patent, with normal flow related enhancement of the main branch vessels. Normal flow related enhancement of the posterior cerebral arteries. Small RIGHT and robust LEFT posterior communicating artery is present. No large vessel occlusion, high-grade stenosis, abnormal luminal irregularity, aneurysm. ANATOMIC VARIANTS: None. IMPRESSION: MRI HEAD: No acute intracranial process on this moderately motion degraded examination. Stable examination including old small cerebellar infarcts and mild chronic small vessel ischemic disease. MRA HEAD: No emergent large vessel occlusion or severe stenosis on this moderately motion degraded examination. Mild to moderate anterior and middle cerebral artery tandem stenosis could be secondary to atherosclerosis or motion artifact. Electronically Signed   By: Awilda Metro M.D.   On: 02/24/2016 02:15   Ct  Angio Chest/abd/pel For Dissection W And/or Wo Contrast  Result Date: 02/24/2016 CLINICAL DATA:  Generalized chest pain and right-sided weakness. Onset at 22:00. EXAM: CT ANGIOGRAPHY CHEST, ABDOMEN AND PELVIS TECHNIQUE: Multidetector CT imaging through the chest, abdomen and pelvis was performed using the standard protocol during bolus administration of intravenous contrast. Multiplanar reconstructed images and MIPs were obtained and reviewed to evaluate the vascular anatomy. CONTRAST:  80 mL Isovue 370 intravenous COMPARISON:  None. FINDINGS: CTA CHEST FINDINGS Cardiovascular: Preferential opacification of the thoracic aorta. No aneurysm or dissection. Standard 3 vessel anatomy of the great vessel origins. Proximal great vessels are widely patent. Little or no plaque is visible in the thoracic aorta. Minimal calcified plaque visible in the left anterior descending coronary artery. Mediastinum/Nodes: No enlarged mediastinal, hilar, or axillary lymph nodes. Thyroid gland, trachea, and esophagus demonstrate no significant findings. Lungs/Pleura: Lungs are clear.  No pleural effusion or pneumothorax. Musculoskeletal: No chest wall abnormality. No acute or significant osseous findings. Review of the MIP images confirms the above findings. CTA ABDOMEN AND PELVIS FINDINGS VASCULAR Aorta: Normal caliber aorta with mild atherosclerotic calcification. No dissection or stenosis. Celiac: Patent without evidence of aneurysm, dissection, vasculitis or significant stenosis. SMA: Patent without evidence of aneurysm, dissection, vasculitis or significant stenosis. Renals: Both renal arteries are patent without evidence of aneurysm, dissection, vasculitis, fibromuscular dysplasia or significant stenosis. IMA: Patent without evidence of aneurysm, dissection, vasculitis or significant stenosis. Inflow: Patent without evidence of aneurysm, dissection, vasculitis or significant stenosis. Mild atherosclerotic calcification, particularly in the common iliac arteries and at the iliac bifurcations, non stenotic. Veins: No obvious venous abnormality within the limitations of this arterial phase study. Review of the MIP images confirms the above findings. NON-VASCULAR Hepatobiliary: Unremarkable arterial phase appearances of the liver. Gallbladder and bile ducts are unremarkable. Pancreas: Unremarkable. No pancreatic ductal dilatation or surrounding inflammatory changes. Spleen: Normal in size without focal abnormality. Adrenals/Urinary Tract: Adrenal glands are unremarkable. Kidneys are normal, without renal calculi, focal lesion, or hydronephrosis. Bladder is unremarkable. Stomach/Bowel: Stomach, small bowel and colon appear unremarkable. Normal appendix. Lymphatic: No pathologic adenopathy in the abdomen or pelvis. Reproductive: Unremarkable Other: No acute inflammation.  No ascites. Musculoskeletal: No significant skeletal lesion. Review of the MIP images confirms the above findings. IMPRESSION: 1. Fatty aorta is normal in caliber throughout its length with only mild atherosclerotic  calcification the abdomen. No aneurysm, dissection or stenosis. 2. Major branches of the aorta are widely patent, without stenosis or dissection. 3. Mild coronary artery calcification, predominantly in the LAD. 4. No acute findings are evident in the chest, abdomen or pelvis. Electronically Signed   By: Ellery Plunk M.D.   On: 02/24/2016 00:02   Ct Head Code Stroke W/o Cm  Result Date: 02/23/2016 CLINICAL DATA:  Code stroke. Weakness, confusion and hallucinations. RIGHT-sided weakness. Last seen normal at 10 p.m. History of hypertension, hyperlipidemia and stroke. EXAM: CT HEAD WITHOUT CONTRAST TECHNIQUE: Contiguous axial images were obtained from the base of the skull through the vertex without intravenous contrast. COMPARISON:  MRI of the head November 07, 2015 FINDINGS: BRAIN: The ventricles and sulci are normal. No intraparenchymal hemorrhage, mass effect nor midline shift. Old small cerebellar infarcts. 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 No acute large vascular territory infarcts. No abnormal extra-axial fluid collections. Basal cisterns are patent. VASCULAR: Trace calcific atherosclerosis the carotid siphons. SKULL/SOFT TISSUES: No skull fracture. No significant soft tissue swelling. ORBITS/SINUSES: The included ocular globes and orbital contents are  normal.Severe LEFT maxillary sinusitis, moderate remaining paranasal sinusitis. Mastoid air cells are well aerated. OTHER: Multiple dental caries and periapical abscesses. ASPECTS [facility]([place] Stroke Program Early CT Score) - Ganglionic level infarction (caudate, lentiform nuclei, internal capsule, insula, M1-M3 cortex): 7 - Supraganglionic infarction (M4-M6 cortex): 3 Total score (0-10 with 10 being normal): 10 IMPRESSION: 1. No acute intracranial process. Old cerebellar infarcts. Moderate to severe pan paranasal sinusitis. 2. ASPECTS is 10. Critical Value/emergent results were called by  telephone at the time of interpretation on 02/23/2016 at 11:40 pm to Dr. Otelia LimesLindzen, Neurology, who verbally acknowledged these results. Electronically Signed   By: Awilda Metroourtnay  Bloomer M.D.   On: 02/23/2016 23:43        Scheduled Meds: . amLODipine  10 mg Oral Daily  . aspirin EC  325 mg Oral Daily  . atorvastatin  20 mg Oral q1800  . buPROPion  100 mg Oral BID WC  . cefTRIAXone (ROCEPHIN)  IV  2 g Intravenous Q24H  . enoxaparin (LOVENOX) injection  40 mg Subcutaneous Q24H  . hydrALAZINE  50 mg Oral BID  . Influenza vac split quadrivalent PF  0.5 mL Intramuscular Tomorrow-1000  . paliperidone  6 mg Oral QHS   Continuous Infusions:   LOS: 1 day     Adolf Ormiston Annett Gulaaniel Choua Chalker, MD [facility] Pager [phone]  If 7PM-7AM, please contact night-coverage [email or web address] Password [email or web address] 02/25/2016, 9:18 AM

## 2016-02-26 DIAGNOSIS — R404 Transient alteration of awareness: Secondary | ICD-10-CM

## 2016-02-26 LAB — BASIC METABOLIC PANEL
ANION GAP: 8 (ref 5–15)
BUN: 14 mg/dL (ref 6–20)
CALCIUM: 9.2 mg/dL (ref 8.9–10.3)
CO2: 26 mmol/L (ref 22–32)
Chloride: 102 mmol/L (ref 101–111)
Creatinine, Ser: 1.43 mg/dL — ABNORMAL HIGH (ref 0.61–1.24)
GFR, EST NON AFRICAN AMERICAN: 56 mL/min — AB (ref >60)
Glucose, Bld: 89 mg/dL (ref 65–99)
POTASSIUM: 3.3 mmol/L — AB (ref 3.5–5.1)
Sodium: 136 mmol/L (ref 135–145)

## 2016-02-26 LAB — CULTURE, BLOOD (ROUTINE X 2)

## 2016-02-26 LAB — CBC WITH DIFFERENTIAL/PLATELET
BASOS ABS: 0 10*3/uL (ref 0.0–0.1)
BASOS PCT: 1 %
Eosinophils Absolute: 0.1 10*3/uL (ref 0.0–0.7)
Eosinophils Relative: 3 %
HEMATOCRIT: 42.6 % (ref 39.0–52.0)
Hemoglobin: 14.4 g/dL (ref 13.0–17.0)
Lymphocytes Relative: 43 %
Lymphs Abs: 1.9 10*3/uL (ref 0.7–4.0)
MCH: 29.6 pg (ref 26.0–34.0)
MCHC: 33.8 g/dL (ref 30.0–36.0)
MCV: 87.7 fL (ref 78.0–100.0)
MONO ABS: 0.4 10*3/uL (ref 0.1–1.0)
Monocytes Relative: 10 %
NEUTROS ABS: 1.9 10*3/uL (ref 1.7–7.7)
Neutrophils Relative %: 43 %
PLATELETS: 180 10*3/uL (ref 150–400)
RBC: 4.86 MIL/uL (ref 4.22–5.81)
RDW: 14.5 % (ref 11.5–15.5)
WBC: 4.4 10*3/uL (ref 4.0–10.5)

## 2016-02-26 MED ORDER — AMOXICILLIN-POT CLAVULANATE 500-125 MG PO TABS: 1.0000 | ORAL_TABLET | Freq: Two times a day (BID) | ORAL | 0 refills | Status: DC

## 2016-02-26 MED ORDER — AMOXICILLIN-POT CLAVULANATE 500-125 MG PO TABS
1.0000 | ORAL_TABLET | Freq: Two times a day (BID) | ORAL | Status: DC
  Administered 2016-02-26: 500 mg via ORAL
  Filled 2016-02-26: qty 1

## 2016-02-26 NOTE — Discharge Summary (Signed)
Physician Discharge Summary  Thomas [name] HQI:[id number] DOB: 08-Jan-1966 DOA: 02/23/2016  PCP: Dorrene German, MD  Admit date: 02/23/2016 Discharge date: 02/26/2016  Admitted From: Home  Disposition:  Home   Recommendations for Outpatient Follow-up:  1. Follow up with PCP in 1-week 2. Patient has been placed of augmentin for the next 7 days.   Home Health: No  Equipment/Devices: NA  Discharge Condition: Stable  CODE STATUS: Full Diet recommendation: Heart Healthy   Brief/Interim Summary: This is a 51 yo male who presented to the hospital with the chief complain of lower extremity paresthesias and pain. He has a history of CVA in 2017. His symptoms occur abruptly, associated with diaphoresis and confusion. Apparently over last week patient was noted to have generalized weakness and malaise, associated with cough, chills and blurred vision. On initial physical examination his blood pressure was elevated 147/111, heart rate 73, respiratory rate 19, oxygen saturation 96%. His lungs were clear to auscultation, heart S1-S2 present rhythmic, his abdomen was soft nontender, lower extremities had no edema, neurologically patient was nonfocal. Sodium 135, potassium 3.2, chloride 100, bicarbonate 23, glucose 125, BUN 16, creatinine 1.92, white count 10.4, hemoglobin 15.6, hematocrit 44.8, platelets 146. CT head showed no acute infarcts, UDS was positive for tetrahydrocannabinol. His urine analysis at too numerous to count white cells. CT of the chest, abdomen and pelvis show no aneurysm, no acute findings on the chest. MRI of the brain was negative.  The patient was admitted to hospital with working diagnosis of metabolic encephalopathy related to urinary tract infection, present on admission. Complicated with acute kidney injury on chronic kidney disease, uncontrolled hypertension, rule out CVA.  1. Urinary tract infection with metabolic encephalopathy. Patient was placed on ceftriaxone intravenously,  admitted to medical floor, he was placed on IV fluids. He responded well to the medical therapy, his blood culture grow Streptococcus pneumonia. Further testing rule out for pneumonia. Patient will be transitioned to Augmentin for the next 7 days. Renal ultrasonography no signs of obstructive uropathy. His encephalopathy improved.  2. Lower extremity paresthesias. Patient had extensive workup for CVA, echocardiography negative for thrombus. Preliminary report of carotid ultrasonography negative for significant stenosis.MRI/MRA had no acute findings. Neurology evaluated the patient with recommendations to continue aspirin at discharge.  3. Hypertension. Patient was resumed on his antihypertensive agents including amlodipine, hydralazine, hydrochlorothiazide and lisinopril.   4. Acute kidney injury on chronic kidney disease stage II. Patient responded well to the medical therapy, he received IV fluids. His discharge creatinine is 1.43. back to its baseline. Patient was advised to continue blood pressure control.   5. Depression. Patient will continue bupropion and invega.    Discharge Diagnoses:  Principal Problem:   Urinary tract infection without hematuria Active Problems:   TOBACCO ABUSE   HYPERTENSION, BENIGN ESSENTIAL   AKI (acute kidney injury) (HCC)   Altered mental status   Acute encephalopathy   Chest pain   History of stroke    Discharge Instructions  Discharge Instructions    Ambulatory referral to Neurology    Complete by:  As directed    Scheduled to follow up with Dr. Pearlean Brownie on 02/09/16 but was cancelled due to weather. Please follow up with Dr. Pearlean Brownie at [facility] in 2 months. Thanks.     Allergies as of 02/26/2016   No Known Allergies     Medication List    TAKE these medications   amLODipine 10 MG tablet Commonly known as:  NORVASC Take 1 tablet (10 mg  total) by mouth daily.   amoxicillin-clavulanate 500-125 MG tablet Commonly known as:  AUGMENTIN Take 1 tablet (500  mg total) by mouth 2 (two) times daily.   aspirin EC 325 MG tablet Take 325 mg by mouth daily.   buPROPion 100 MG 12 hr tablet Commonly known as:  WELLBUTRIN SR Take 100 mg by mouth 2 (two) times daily.   hydrALAZINE 50 MG tablet Commonly known as:  APRESOLINE Take 50 mg by mouth 2 (two) times daily.   lisinopril-hydrochlorothiazide 20-12.5 MG tablet Commonly known as:  PRINZIDE,ZESTORETIC Take 2 tablets by mouth daily.   paliperidone 6 MG 24 hr tablet Commonly known as:  INVEGA Take 6 mg by mouth at bedtime.      Follow-up Information    SETHI,PRAMOD, MD. Schedule an appointment as soon as possible for a visit in 6 week(s).   Specialties:  Neurology, Radiology Contact information: [address] [place] [address] [phone]          No Known Allergies  Consultations:     Procedures/Studies: Dg Chest 2 View  Result Date: 02/25/2016 CLINICAL DATA:  51 year old male with left ear pain, fever, possible kidney infection. Initial encounter. EXAM: CHEST  2 VIEW COMPARISON:  Chest abdomen and pelvis CTA 02/23/2016 and earlier. FINDINGS: Normal lung volumes. Mediastinal contours are stable and within normal limits. Visualized tracheal air column is within normal limits. The lungs are clear. No pneumothorax or pleural effusion. No acute osseous abnormality identified. Negative visible bowel gas pattern. IMPRESSION: Negative.  No acute cardiopulmonary abnormality. Electronically Signed   By: Odessa Fleming M.D.   On: 02/25/2016 15:23   Mr Angiogram Head Wo Contrast  Result Date: 02/24/2016 CLINICAL DATA:  Acute onset RIGHT upper and bilateral lower extremity tingling. Last seen normal at 10 p.m. transient chest pain, abdominal pain while driving. History of hypertension, stroke. EXAM: MRI HEAD WITHOUT CONTRAST MRA HEAD WITHOUT CONTRAST TECHNIQUE: Multiplanar, multiecho pulse sequences of the brain and surrounding structures were obtained without intravenous contrast.  Angiographic images of the head were obtained using MRA technique without contrast. COMPARISON:  MRI and MRA head November 07, 2015. CT HEAD February 23, 2016. FINDINGS: MRI HEAD FINDINGS- moderately motion degraded examination. BRAIN: No reduced diffusion to suggest acute ischemia. No susceptibility artifact to suggest hemorrhage. The ventricles and sulci are normal for patient's age. Old small bilateral cerebellar infarcts. Unchanged linear bright FLAIR T2 hyperintense signal LEFT frontal lobe could be secondary to developmental venous anomaly. A few scattered subcentimeter supratentorial white matter FLAIR T2 hyperintensities. No suspicious parenchymal signal, masses or mass effect. No abnormal extra-axial fluid collections. VASCULAR: Normal major intracranial vascular flow voids present at skull base. SKULL AND UPPER CERVICAL SPINE: No abnormal sellar expansion. No suspicious calvarial bone marrow signal. Craniocervical junction maintained. SINUSES/ORBITS: Severe LEFT maxillary sinusitis. Mild to moderate pan paranasal sinus mucosal thickening. Mastoid air cells are well aerated. The included ocular globes and orbital contents are non-suspicious. OTHER: None. MRA HEAD FINDINGS- moderately motion degraded examination. ANTERIOR CIRCULATION: Normal flow related enhancement of the included cervical, petrous, cavernous and supraclinoid internal carotid arteries. Patent anterior communicating artery. Flow related enhancement of the anterior and middle cerebral arteries, mild to moderate tandem stenoses anterior cerebral arteries and middle cerebral arteries. No large vessel occlusion, high-grade stenosis, abnormal luminal irregularity, aneurysm. POSTERIOR CIRCULATION: Codominant vertebral artery's. Basilar artery is patent, with normal flow related enhancement of the main branch vessels. Normal flow related enhancement of the posterior cerebral arteries. Small RIGHT and robust LEFT  posterior communicating artery is  present. No large vessel occlusion, high-grade stenosis, abnormal luminal irregularity, aneurysm. ANATOMIC VARIANTS: None. IMPRESSION: MRI HEAD: No acute intracranial process on this moderately motion degraded examination. Stable examination including old small cerebellar infarcts and mild chronic small vessel ischemic disease. MRA HEAD: No emergent large vessel occlusion or severe stenosis on this moderately motion degraded examination. Mild to moderate anterior and middle cerebral artery tandem stenosis could be secondary to atherosclerosis or motion artifact. Electronically Signed   By: Awilda Metro M.D.   On: 02/24/2016 02:15   Mr Brain Wo Contrast  Result Date: 02/24/2016 CLINICAL DATA:  Acute onset RIGHT upper and bilateral lower extremity tingling. Last seen normal at 10 p.m. transient chest pain, abdominal pain while driving. History of hypertension, stroke. EXAM: MRI HEAD WITHOUT CONTRAST MRA HEAD WITHOUT CONTRAST TECHNIQUE: Multiplanar, multiecho pulse sequences of the brain and surrounding structures were obtained without intravenous contrast. Angiographic images of the head were obtained using MRA technique without contrast. COMPARISON:  MRI and MRA head November 07, 2015. CT HEAD February 23, 2016. FINDINGS: MRI HEAD FINDINGS- moderately motion degraded examination. BRAIN: No reduced diffusion to suggest acute ischemia. No susceptibility artifact to suggest hemorrhage. The ventricles and sulci are normal for patient's age. Old small bilateral cerebellar infarcts. Unchanged linear bright FLAIR T2 hyperintense signal LEFT frontal lobe could be secondary to developmental venous anomaly. A few scattered subcentimeter supratentorial white matter FLAIR T2 hyperintensities. No suspicious parenchymal signal, masses or mass effect. No abnormal extra-axial fluid collections. VASCULAR: Normal major intracranial vascular flow voids present at skull base. SKULL AND UPPER CERVICAL SPINE: No abnormal sellar  expansion. No suspicious calvarial bone marrow signal. Craniocervical junction maintained. SINUSES/ORBITS: Severe LEFT maxillary sinusitis. Mild to moderate pan paranasal sinus mucosal thickening. Mastoid air cells are well aerated. The included ocular globes and orbital contents are non-suspicious. OTHER: None. MRA HEAD FINDINGS- moderately motion degraded examination. ANTERIOR CIRCULATION: Normal flow related enhancement of the included cervical, petrous, cavernous and supraclinoid internal carotid arteries. Patent anterior communicating artery. Flow related enhancement of the anterior and middle cerebral arteries, mild to moderate tandem stenoses anterior cerebral arteries and middle cerebral arteries. No large vessel occlusion, high-grade stenosis, abnormal luminal irregularity, aneurysm. POSTERIOR CIRCULATION: Codominant vertebral artery's. Basilar artery is patent, with normal flow related enhancement of the main branch vessels. Normal flow related enhancement of the posterior cerebral arteries. Small RIGHT and robust LEFT posterior communicating artery is present. No large vessel occlusion, high-grade stenosis, abnormal luminal irregularity, aneurysm. ANATOMIC VARIANTS: None. IMPRESSION: MRI HEAD: No acute intracranial process on this moderately motion degraded examination. Stable examination including old small cerebellar infarcts and mild chronic small vessel ischemic disease. MRA HEAD: No emergent large vessel occlusion or severe stenosis on this moderately motion degraded examination. Mild to moderate anterior and middle cerebral artery tandem stenosis could be secondary to atherosclerosis or motion artifact. Electronically Signed   By: Awilda Metro M.D.   On: 02/24/2016 02:15   US Renal  Result Date: 02/25/2016 CLINICAL DATA:  Urinary tract infection EXAM: RENAL / URINARY TRACT ULTRASOUND COMPLETE COMPARISON:  CT of the chest February 23, 2016 FINDINGS: Right Kidney: Length: 10.4 cm. Echogenicity  within normal limits. No mass or hydronephrosis visualized. Left Kidney: Length: 10.6 cm. Echogenicity within normal limits. No mass or hydronephrosis visualized. Bladder: Appears normal for degree of bladder distention. Other: There is a small right pleural effusion. A 1.3 x 1.1 x 1.2 cm echogenic mass is seen in the right lobe of the liver,  not appreciated on the comparison chest CT. IMPRESSION: 1. No renal abnormalities identified.  The bladder is unremarkable. 2. 1.3 cm echogenic mass in the right hepatic lobe. This is not specific but most likely a hemangioma. Electronically Signed   By: Gerome Sam III M.D   On: 02/25/2016 09:46   Ct Angio Chest/abd/pel For Dissection W And/or Wo Contrast  Result Date: 02/24/2016 CLINICAL DATA:  Generalized chest pain and right-sided weakness. Onset at 22:00. EXAM: CT ANGIOGRAPHY CHEST, ABDOMEN AND PELVIS TECHNIQUE: Multidetector CT imaging through the chest, abdomen and pelvis was performed using the standard protocol during bolus administration of intravenous contrast. Multiplanar reconstructed images and MIPs were obtained and reviewed to evaluate the vascular anatomy. CONTRAST:  80 mL Isovue 370 intravenous COMPARISON:  None. FINDINGS: CTA CHEST FINDINGS Cardiovascular: Preferential opacification of the thoracic aorta. No aneurysm or dissection. Standard 3 vessel anatomy of the great vessel origins. Proximal great vessels are widely patent. Little or no plaque is visible in the thoracic aorta. Minimal calcified plaque visible in the left anterior descending coronary artery. Mediastinum/Nodes: No enlarged mediastinal, hilar, or axillary lymph nodes. Thyroid gland, trachea, and esophagus demonstrate no significant findings. Lungs/Pleura: Lungs are clear. No pleural effusion or pneumothorax. Musculoskeletal: No chest wall abnormality. No acute or significant osseous findings. Review of the MIP images confirms the above findings. CTA ABDOMEN AND PELVIS FINDINGS VASCULAR  Aorta: Normal caliber aorta with mild atherosclerotic calcification. No dissection or stenosis. Celiac: Patent without evidence of aneurysm, dissection, vasculitis or significant stenosis. SMA: Patent without evidence of aneurysm, dissection, vasculitis or significant stenosis. Renals: Both renal arteries are patent without evidence of aneurysm, dissection, vasculitis, fibromuscular dysplasia or significant stenosis. IMA: Patent without evidence of aneurysm, dissection, vasculitis or significant stenosis. Inflow: Patent without evidence of aneurysm, dissection, vasculitis or significant stenosis. Mild atherosclerotic calcification, particularly in the common iliac arteries and at the iliac bifurcations, non stenotic. Veins: No obvious venous abnormality within the limitations of this arterial phase study. Review of the MIP images confirms the above findings. NON-VASCULAR Hepatobiliary: Unremarkable arterial phase appearances of the liver. Gallbladder and bile ducts are unremarkable. Pancreas: Unremarkable. No pancreatic ductal dilatation or surrounding inflammatory changes. Spleen: Normal in size without focal abnormality. Adrenals/Urinary Tract: Adrenal glands are unremarkable. Kidneys are normal, without renal calculi, focal lesion, or hydronephrosis. Bladder is unremarkable. Stomach/Bowel: Stomach, small bowel and colon appear unremarkable. Normal appendix. Lymphatic: No pathologic adenopathy in the abdomen or pelvis. Reproductive: Unremarkable Other: No acute inflammation.  No ascites. Musculoskeletal: No significant skeletal lesion. Review of the MIP images confirms the above findings. IMPRESSION: 1. Fatty aorta is normal in caliber throughout its length with only mild atherosclerotic calcification the abdomen. No aneurysm, dissection or stenosis. 2. Major branches of the aorta are widely patent, without stenosis or dissection. 3. Mild coronary artery calcification, predominantly in the LAD. 4. No acute  findings are evident in the chest, abdomen or pelvis. Electronically Signed   By: Ellery Plunk M.D.   On: 02/24/2016 00:02   Ct Head Code Stroke W/o Cm  Result Date: 02/23/2016 CLINICAL DATA:  Code stroke. Weakness, confusion and hallucinations. RIGHT-sided weakness. Last seen normal at 10 p.m. History of hypertension, hyperlipidemia and stroke. EXAM: CT HEAD WITHOUT CONTRAST TECHNIQUE: Contiguous axial images were obtained from the base of the skull through the vertex without intravenous contrast. COMPARISON:  MRI of the head November 07, 2015 FINDINGS: BRAIN: The ventricles and sulci are normal. No intraparenchymal hemorrhage, mass effect nor midline shift. Old small cerebellar infarcts.  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 No acute large vascular territory infarcts. No abnormal extra-axial fluid collections. Basal cisterns are patent. VASCULAR: Trace calcific atherosclerosis the carotid siphons. SKULL/SOFT TISSUES: No skull fracture. No significant soft tissue swelling. ORBITS/SINUSES: The included ocular globes and orbital contents are normal.Severe LEFT maxillary sinusitis, moderate remaining paranasal sinusitis. Mastoid air cells are well aerated. OTHER: Multiple dental caries and periapical abscesses. ASPECTS [facility] Stroke Program Early CT Score) - Ganglionic level infarction (caudate, lentiform nuclei, internal capsule, insula, M1-M3 cortex): 7 - Supraganglionic infarction (M4-M6 cortex): 3 Total score (0-10 with 10 being normal): 10 IMPRESSION: 1. No acute intracranial process. Old cerebellar infarcts. Moderate to severe pan paranasal sinusitis. 2. ASPECTS is 10. Critical Value/emergent results were called by telephone at the time of interpretation on 02/23/2016 at 11:40 pm to Dr. Otelia Limes, Neurology, who verbally acknowledged these results. Electronically Signed   By: Awilda Metro M.D.   On: 02/23/2016 23:43    (Echo,  Carotid, EGD, Colonoscopy, ERCP)    Subjective: Patient feeling better, no nausea or vomiting, tolerating po well. No dysuria or increase urinary frequency.   Discharge Exam: Vitals:   02/25/16 2100 02/26/16 0600  BP: (!) 142/91 (!) 136/106  Pulse: 81 81  Resp: 18 18  Temp: 98.5 F (36.9 C) 98.3 F (36.8 C)   Vitals:   02/25/16 1001 02/25/16 1720 02/25/16 2100 02/26/16 0600  BP: (!) 142/104 (!) 147/89 (!) 142/91 (!) 136/106  Pulse: 82 85 81 81  Resp: 19 18 18 18   Temp: 97.9 F (36.6 C) 98.1 F (36.7 C) 98.5 F (36.9 C) 98.3 F (36.8 C)  TempSrc: Oral Oral Oral Oral  SpO2: 100% 97% 99% 99%  Weight:   87.1 kg (192 lb 1.6 oz)   Height:        General: Pt is alert, awake, not in acute distress Cardiovascular: RRR, S1/S2 +, no rubs, no gallops Respiratory: CTA bilaterally, no wheezing, no rhonchi Abdominal: Soft, NT, ND, bowel sounds + Extremities: no edema, no cyanosis    The results of significant diagnostics from this hospitalization (including imaging, microbiology, ancillary and laboratory) are listed below for reference.     Microbiology: Recent Results (from the past 240 hour(s))  Urine culture     Status: Abnormal   Collection Time: 02/23/16 12:18 AM  Result Value Ref Range Status   Specimen Description URINE, RANDOM  Final   Special Requests ADDED [id number] [phone]  Final   Culture MULTIPLE SPECIES PRESENT, SUGGEST RECOLLECTION (A)  Final   Report Status 02/25/2016 FINAL  Final  Blood culture (routine x 2)     Status: Abnormal (Preliminary result)   Collection Time: 02/24/16  2:20 AM  Result Value Ref Range Status   Specimen Description BLOOD RIGHT ARM  Final   Special Requests BOTTLES DRAWN AEROBIC AND ANAEROBIC  Final   Culture  Setup Time   Final    GRAM POSITIVE COCCI IN CHAINS IN PAIRS ANAEROBIC BOTTLE ONLY CRITICAL RESULT CALLED TO, READ BACK BY AND VERIFIED WITH: Lytle Butte PHARMD 2014 02/24/16 A BROWNING    Culture (A)  Final    STREPTOCOCCUS  PNEUMONIAE SUSCEPTIBILITIES TO FOLLOW    Report Status PENDING  Incomplete  Blood Culture ID Panel (Reflexed)     Status: Abnormal   Collection Time: 02/24/16  2:20 AM  Result Value Ref Range Status   Enterococcus species NOT DETECTED NOT DETECTED Final   Listeria monocytogenes NOT DETECTED NOT  DETECTED Final   Staphylococcus species NOT DETECTED NOT DETECTED Final   Staphylococcus aureus NOT DETECTED NOT DETECTED Final   Streptococcus species DETECTED (A) NOT DETECTED Final    Comment: CRITICAL RESULT CALLED TO, READ BACK BY AND VERIFIED WITH: Lytle Butte PHARMD 2014 02/24/16 A BROWNING    Streptococcus agalactiae NOT DETECTED NOT DETECTED Final   Streptococcus pneumoniae DETECTED (A) NOT DETECTED Final    Comment: CRITICAL RESULT CALLED TO, READ BACK BY AND VERIFIED WITH: Lytle Butte PHARMD 2014 02/24/16 A BROWNING    Streptococcus pyogenes NOT DETECTED NOT DETECTED Final   Acinetobacter baumannii NOT DETECTED NOT DETECTED Final   Enterobacteriaceae species NOT DETECTED NOT DETECTED Final   Enterobacter cloacae complex NOT DETECTED NOT DETECTED Final   Escherichia coli NOT DETECTED NOT DETECTED Final   Klebsiella oxytoca NOT DETECTED NOT DETECTED Final   Klebsiella pneumoniae NOT DETECTED NOT DETECTED Final   Proteus species NOT DETECTED NOT DETECTED Final   Serratia marcescens NOT DETECTED NOT DETECTED Final   Haemophilus influenzae NOT DETECTED NOT DETECTED Final   Neisseria meningitidis NOT DETECTED NOT DETECTED Final   Pseudomonas aeruginosa NOT DETECTED NOT DETECTED Final   Candida albicans NOT DETECTED NOT DETECTED Final   Candida glabrata NOT DETECTED NOT DETECTED Final   Candida krusei NOT DETECTED NOT DETECTED Final   Candida parapsilosis NOT DETECTED NOT DETECTED Final   Candida tropicalis NOT DETECTED NOT DETECTED Final  Blood culture (routine x 2)     Status: None (Preliminary result)   Collection Time: 02/24/16  2:30 AM  Result Value Ref Range Status   Specimen  Description BLOOD LEFT HAND  Final   Special Requests IN PEDIATRIC BOTTLE  Final   Culture NO GROWTH 1 DAY  Final   Report Status PENDING  Incomplete     Labs: BNP (last 3 results) No results for input(s): BNP in the last 8760 hours. Basic Metabolic Panel:  Recent Labs Lab 02/23/16 2224 02/23/16 2323 02/23/16 2333 02/24/16 0615 02/24/16 0757 02/25/16 0438 02/26/16 0437  NA 136 135 137  --  137 136 136  K 3.1* 3.2* 3.1*  --  3.7 3.4* 3.3*  CL 98* 100* 101  --  99* 104 102  CO2 26 23  --   --  24 24 26   GLUCOSE 102* 125* 124*  --  128* 84 89  BUN 15 16 19   --  15 11 14   CREATININE 1.90* 1.92* 1.90*  --  1.56* 1.28* 1.43*  CALCIUM 9.5 9.3  --   --  9.0 8.9 9.2  MG  --   --   --  1.9  --   --   --    Liver Function Tests:  Recent Labs Lab 02/23/16 2224 02/23/16 2323  AST 64* 61*  ALT 88* 85*  ALKPHOS 76 71  BILITOT 1.6* 1.8*  PROT 8.4* 8.2*  ALBUMIN 3.8 3.7   No results for input(s): LIPASE, AMYLASE in the last 168 hours. No results for input(s): AMMONIA in the last 168 hours. CBC:  Recent Labs Lab 02/23/16 2224 02/23/16 2323 02/23/16 2333 02/24/16 0757 02/25/16 0438 02/26/16 0437  WBC 9.6 10.4  --  8.8 4.8 4.4  NEUTROABS  --  6.5  --   --  2.4 1.9  HGB 15.8 15.6 15.6 14.8 14.4 14.4  HCT 45.8 44.8 46.0 42.6 41.7 42.6  MCV 87.4 86.5  --  87.7 86.7 87.7  PLT 181 146*  --  143* 161 180  Cardiac Enzymes:  Recent Labs Lab 02/23/16 2323 02/24/16 0615 02/24/16 1224  TROPONINI 0.04* 0.05* 0.04*   BNP: Invalid input(s): POCBNP CBG:  Recent Labs Lab 02/23/16 2231 02/23/16 2325  GLUCAP 111* 125*   D-Dimer No results for input(s): DDIMER in the last 72 hours. Hgb A1c No results for input(s): HGBA1C in the last 72 hours. Lipid Profile No results for input(s): CHOL, HDL, LDLCALC, TRIG, CHOLHDL, LDLDIRECT in the last 72 hours. Thyroid function studies No results for input(s): TSH, T4TOTAL, T3FREE, THYROIDAB in the last 72 hours.  Invalid  input(s): FREET3 Anemia work up No results for input(s): VITAMINB12, FOLATE, FERRITIN, TIBC, IRON, RETICCTPCT in the last 72 hours. Urinalysis    Component Value Date/Time   COLORURINE AMBER (A) 02/24/2016 0018   APPEARANCEUR HAZY (A) 02/24/2016 0018   LABSPEC 1.040 (H) 02/24/2016 0018   PHURINE 5.0 02/24/2016 0018   GLUCOSEU NEGATIVE 02/24/2016 0018   HGBUR NEGATIVE 02/24/2016 0018   HGBUR negative 09/28/2009 0923   BILIRUBINUR NEGATIVE 02/24/2016 0018   KETONESUR NEGATIVE 02/24/2016 0018   PROTEINUR NEGATIVE 02/24/2016 0018   UROBILINOGEN 2.0 09/28/2009 0923   NITRITE NEGATIVE 02/24/2016 0018   LEUKOCYTESUR MODERATE (A) 02/24/2016 0018   Sepsis Labs Invalid input(s): PROCALCITONIN,  WBC,  LACTICIDVEN Microbiology Recent Results (from the past 240 hour(s))  Urine culture     Status: Abnormal   Collection Time: 02/23/16 12:18 AM  Result Value Ref Range Status   Specimen Description URINE, RANDOM  Final   Special Requests ADDED [id number] [phone]  Final   Culture MULTIPLE SPECIES PRESENT, SUGGEST RECOLLECTION (A)  Final   Report Status 02/25/2016 FINAL  Final  Blood culture (routine x 2)     Status: Abnormal (Preliminary result)   Collection Time: 02/24/16  2:20 AM  Result Value Ref Range Status   Specimen Description BLOOD RIGHT ARM  Final   Special Requests BOTTLES DRAWN AEROBIC AND ANAEROBIC  Final   Culture  Setup Time   Final    GRAM POSITIVE COCCI IN CHAINS IN PAIRS ANAEROBIC BOTTLE ONLY CRITICAL RESULT CALLED TO, READ BACK BY AND VERIFIED WITH: Lytle Butte PHARMD 2014 02/24/16 A BROWNING    Culture (A)  Final    STREPTOCOCCUS PNEUMONIAE SUSCEPTIBILITIES TO FOLLOW    Report Status PENDING  Incomplete  Blood Culture ID Panel (Reflexed)     Status: Abnormal   Collection Time: 02/24/16  2:20 AM  Result Value Ref Range Status   Enterococcus species NOT DETECTED NOT DETECTED Final   Listeria monocytogenes NOT DETECTED NOT DETECTED Final   Staphylococcus species NOT DETECTED  NOT DETECTED Final   Staphylococcus aureus NOT DETECTED NOT DETECTED Final   Streptococcus species DETECTED (A) NOT DETECTED Final    Comment: CRITICAL RESULT CALLED TO, READ BACK BY AND VERIFIED WITH: Lytle Butte PHARMD 2014 02/24/16 A BROWNING    Streptococcus agalactiae NOT DETECTED NOT DETECTED Final   Streptococcus pneumoniae DETECTED (A) NOT DETECTED Final    Comment: CRITICAL RESULT CALLED TO, READ BACK BY AND VERIFIED WITH: Lytle Butte PHARMD 2014 02/24/16 A BROWNING    Streptococcus pyogenes NOT DETECTED NOT DETECTED Final   Acinetobacter baumannii NOT DETECTED NOT DETECTED Final   Enterobacteriaceae species NOT DETECTED NOT DETECTED Final   Enterobacter cloacae complex NOT DETECTED NOT DETECTED Final   Escherichia coli NOT DETECTED NOT DETECTED Final   Klebsiella oxytoca NOT DETECTED NOT DETECTED Final   Klebsiella pneumoniae NOT DETECTED NOT DETECTED Final   Proteus species NOT DETECTED NOT DETECTED  Final   Serratia marcescens NOT DETECTED NOT DETECTED Final   Haemophilus influenzae NOT DETECTED NOT DETECTED Final   Neisseria meningitidis NOT DETECTED NOT DETECTED Final   Pseudomonas aeruginosa NOT DETECTED NOT DETECTED Final   Candida albicans NOT DETECTED NOT DETECTED Final   Candida glabrata NOT DETECTED NOT DETECTED Final   Candida krusei NOT DETECTED NOT DETECTED Final   Candida parapsilosis NOT DETECTED NOT DETECTED Final   Candida tropicalis NOT DETECTED NOT DETECTED Final  Blood culture (routine x 2)     Status: None (Preliminary result)   Collection Time: 02/24/16  2:30 AM  Result Value Ref Range Status   Specimen Description BLOOD LEFT HAND  Final   Special Requests IN PEDIATRIC BOTTLE 3ML  Final   Culture NO GROWTH 1 DAY  Final   Report Status PENDING  Incomplete     Time coordinating discharge: 45 minutes  SIGNED:   Coralie KeensMauricio Daniel Arrien, MD  [facility] 02/26/2016, 8:59 AM Pager   If 7PM-7AM, please contact night-coverage [email or web address] Password  [email or web address]

## 2016-02-26 NOTE — Progress Notes (Signed)
Discharged from 6E06 via wheelchair. All belongings sent with patient. No complaints of pain. IV d/c'd. Tele d/c'd. Discharge instructions discussed with patient. No questions.

## 2016-02-28 LAB — VAS US CAROTID
LEFT ECA DIAS: -23 cm/s
LEFT VERTEBRAL DIAS: -14 cm/s
LICADDIAS: -24 cm/s
LICAPDIAS: -23 cm/s
LICAPSYS: -52 cm/s
Left CCA dist dias: -22 cm/s
Left CCA dist sys: -68 cm/s
Left CCA prox dias: 21 cm/s
Left CCA prox sys: 126 cm/s
Left ICA dist sys: -54 cm/s
RIGHT ECA DIAS: -25 cm/s
RIGHT VERTEBRAL DIAS: -9 cm/s
Right CCA prox dias: 20 cm/s
Right CCA prox sys: 129 cm/s
Right cca dist sys: -19 cm/s

## 2016-02-29 LAB — CULTURE, BLOOD (ROUTINE X 2): Culture: NO GROWTH

## 2016-03-01 LAB — CULTURE, BLOOD (ROUTINE X 2): CULTURE: NO GROWTH

## 2016-05-17 ENCOUNTER — Ambulatory Visit: Payer: Medicaid Other | Admitting: Neurology

## 2016-05-17 ENCOUNTER — Telehealth: Payer: Self-pay

## 2016-05-17 NOTE — Telephone Encounter (Signed)
Patient no show for appointment today.

## 2016-05-18 ENCOUNTER — Encounter: Payer: Self-pay | Admitting: Neurology

## 2016-06-07 ENCOUNTER — Encounter (HOSPITAL_COMMUNITY): Payer: Self-pay

## 2016-06-07 ENCOUNTER — Encounter (HOSPITAL_COMMUNITY): Payer: Self-pay | Admitting: *Deleted

## 2016-06-07 ENCOUNTER — Emergency Department (HOSPITAL_COMMUNITY): Payer: Medicaid Other

## 2016-06-07 ENCOUNTER — Ambulatory Visit (HOSPITAL_COMMUNITY)
Admission: EM | Admit: 2016-06-07 | Discharge: 2016-06-07 | Disposition: A | Discharge status: Nursing Home (Includes ICF) | Payer: Medicaid Other

## 2016-06-07 ENCOUNTER — Emergency Department (HOSPITAL_BASED_OUTPATIENT_CLINIC_OR_DEPARTMENT_OTHER): Payer: Medicaid Other

## 2016-06-07 ENCOUNTER — Emergency Department (HOSPITAL_COMMUNITY)
Admission: EM | Admit: 2016-06-07 | Discharge: 2016-06-07 | Disposition: A | Discharge status: Home / Self Care | Payer: Medicaid Other | Attending: Emergency Medicine | Admitting: Emergency Medicine

## 2016-06-07 DIAGNOSIS — M79605 Pain in left leg: Secondary | ICD-10-CM | POA: Diagnosis not present

## 2016-06-07 DIAGNOSIS — Z87891 Personal history of nicotine dependence: Secondary | ICD-10-CM | POA: Insufficient documentation

## 2016-06-07 DIAGNOSIS — R200 Anesthesia of skin: Secondary | ICD-10-CM

## 2016-06-07 DIAGNOSIS — M7989 Other specified soft tissue disorders: Secondary | ICD-10-CM

## 2016-06-07 DIAGNOSIS — R202 Paresthesia of skin: Secondary | ICD-10-CM

## 2016-06-07 DIAGNOSIS — R29898 Other symptoms and signs involving the musculoskeletal system: Secondary | ICD-10-CM

## 2016-06-07 DIAGNOSIS — Z7982 Long term (current) use of aspirin: Secondary | ICD-10-CM | POA: Diagnosis not present

## 2016-06-07 DIAGNOSIS — R531 Weakness: Secondary | ICD-10-CM | POA: Diagnosis not present

## 2016-06-07 DIAGNOSIS — Z79899 Other long term (current) drug therapy: Secondary | ICD-10-CM | POA: Insufficient documentation

## 2016-06-07 DIAGNOSIS — Z8673 Personal history of transient ischemic attack (TIA), and cerebral infarction without residual deficits: Secondary | ICD-10-CM | POA: Diagnosis not present

## 2016-06-07 DIAGNOSIS — H53149 Visual discomfort, unspecified: Secondary | ICD-10-CM | POA: Diagnosis not present

## 2016-06-07 DIAGNOSIS — I10 Essential (primary) hypertension: Secondary | ICD-10-CM | POA: Diagnosis not present

## 2016-06-07 DIAGNOSIS — M79609 Pain in unspecified limb: Secondary | ICD-10-CM

## 2016-06-07 DIAGNOSIS — K047 Periapical abscess without sinus: Secondary | ICD-10-CM

## 2016-06-07 DIAGNOSIS — I679 Cerebrovascular disease, unspecified: Secondary | ICD-10-CM | POA: Diagnosis not present

## 2016-06-07 DIAGNOSIS — I160 Hypertensive urgency: Secondary | ICD-10-CM | POA: Diagnosis not present

## 2016-06-07 DIAGNOSIS — R51 Headache: Secondary | ICD-10-CM | POA: Diagnosis not present

## 2016-06-07 DIAGNOSIS — G8194 Hemiplegia, unspecified affecting left nondominant side: Secondary | ICD-10-CM

## 2016-06-07 DIAGNOSIS — M79652 Pain in left thigh: Secondary | ICD-10-CM | POA: Diagnosis not present

## 2016-06-07 DIAGNOSIS — I169 Hypertensive crisis, unspecified: Secondary | ICD-10-CM

## 2016-06-07 DIAGNOSIS — R4182 Altered mental status, unspecified: Secondary | ICD-10-CM | POA: Diagnosis present

## 2016-06-07 LAB — I-STAT TROPONIN, ED: TROPONIN I, POC: 0.03 ng/mL (ref 0.00–0.08)

## 2016-06-07 LAB — URINALYSIS, ROUTINE W REFLEX MICROSCOPIC
BILIRUBIN URINE: NEGATIVE
Glucose, UA: NEGATIVE mg/dL
HGB URINE DIPSTICK: NEGATIVE
Ketones, ur: NEGATIVE mg/dL
Leukocytes, UA: NEGATIVE
Nitrite: NEGATIVE
PROTEIN: NEGATIVE mg/dL
Specific Gravity, Urine: 1.01 (ref 1.005–1.030)
pH: 7 (ref 5.0–8.0)

## 2016-06-07 LAB — DIFFERENTIAL
BASOS ABS: 0 10*3/uL (ref 0.0–0.1)
BASOS PCT: 0 %
EOS ABS: 0.2 10*3/uL (ref 0.0–0.7)
EOS PCT: 3 %
LYMPHS ABS: 2.2 10*3/uL (ref 0.7–4.0)
Lymphocytes Relative: 38 %
MONO ABS: 0.5 10*3/uL (ref 0.1–1.0)
MONOS PCT: 9 %
Neutro Abs: 2.8 10*3/uL (ref 1.7–7.7)
Neutrophils Relative %: 50 %

## 2016-06-07 LAB — COMPREHENSIVE METABOLIC PANEL
ALT: 37 U/L (ref 17–63)
AST: 37 U/L (ref 15–41)
Albumin: 3.8 g/dL (ref 3.5–5.0)
Alkaline Phosphatase: 89 U/L (ref 38–126)
Anion gap: 8 (ref 5–15)
BILIRUBIN TOTAL: 0.8 mg/dL (ref 0.3–1.2)
BUN: 13 mg/dL (ref 6–20)
CHLORIDE: 102 mmol/L (ref 101–111)
CO2: 26 mmol/L (ref 22–32)
CREATININE: 1.7 mg/dL — AB (ref 0.61–1.24)
Calcium: 9.2 mg/dL (ref 8.9–10.3)
GFR, EST AFRICAN AMERICAN: 52 mL/min — AB (ref >60)
GFR, EST NON AFRICAN AMERICAN: 45 mL/min — AB (ref >60)
Glucose, Bld: 81 mg/dL (ref 65–99)
POTASSIUM: 3.7 mmol/L (ref 3.5–5.1)
Sodium: 136 mmol/L (ref 135–145)
Total Protein: 7.8 g/dL (ref 6.5–8.1)

## 2016-06-07 LAB — CBC
HEMATOCRIT: 45.4 % (ref 39.0–52.0)
HEMOGLOBIN: 15.6 g/dL (ref 13.0–17.0)
MCH: 29.5 pg (ref 26.0–34.0)
MCHC: 34.4 g/dL (ref 30.0–36.0)
MCV: 86 fL (ref 78.0–100.0)
Platelets: 164 10*3/uL (ref 150–400)
RBC: 5.28 MIL/uL (ref 4.22–5.81)
RDW: 14.7 % (ref 11.5–15.5)
WBC: 5.7 10*3/uL (ref 4.0–10.5)

## 2016-06-07 LAB — I-STAT CHEM 8, ED
BUN: 16 mg/dL (ref 6–20)
CALCIUM ION: 1.1 mmol/L — AB (ref 1.15–1.40)
CREATININE: 1.8 mg/dL — AB (ref 0.61–1.24)
Chloride: 102 mmol/L (ref 101–111)
GLUCOSE: 80 mg/dL (ref 65–99)
HCT: 46 % (ref 39.0–52.0)
Hemoglobin: 15.6 g/dL (ref 13.0–17.0)
Potassium: 3.7 mmol/L (ref 3.5–5.1)
Sodium: 140 mmol/L (ref 135–145)
TCO2: 27 mmol/L (ref 0–100)

## 2016-06-07 LAB — RAPID URINE DRUG SCREEN, HOSP PERFORMED
AMPHETAMINES: NOT DETECTED
BARBITURATES: NOT DETECTED
BENZODIAZEPINES: NOT DETECTED
Cocaine: NOT DETECTED
Opiates: NOT DETECTED
Tetrahydrocannabinol: POSITIVE — AB

## 2016-06-07 LAB — PROTIME-INR
INR: 1.07
Prothrombin Time: 14 seconds (ref 11.4–15.2)

## 2016-06-07 LAB — APTT: aPTT: 30 seconds (ref 24–36)

## 2016-06-07 LAB — ETHANOL: Alcohol, Ethyl (B): <5 mg/dL (ref <5)

## 2016-06-07 LAB — CK: CK TOTAL: 166 U/L (ref 49–397)

## 2016-06-07 MED ORDER — ACETAMINOPHEN 500 MG PO TABS
1000.0000 mg | ORAL_TABLET | Freq: Once | ORAL | Status: AC
  Administered 2016-06-07: 1000 mg via ORAL
  Filled 2016-06-07: qty 2

## 2016-06-07 MED ORDER — AMLODIPINE BESYLATE 10 MG PO TABS: 10.0000 mg | ORAL_TABLET | Freq: Every day | ORAL | 0 refills | Status: DC

## 2016-06-07 MED ORDER — HYDROCHLOROTHIAZIDE 25 MG PO TABS: 25.0000 mg | ORAL_TABLET | Freq: Every day | ORAL | 0 refills | Status: DC

## 2016-06-07 MED ORDER — HYDROCHLOROTHIAZIDE 12.5 MG PO CAPS
12.5000 mg | ORAL_CAPSULE | Freq: Every day | ORAL | Status: DC
  Administered 2016-06-07: 12.5 mg via ORAL
  Filled 2016-06-07: qty 1

## 2016-06-07 MED ORDER — DIPHENHYDRAMINE HCL 50 MG/ML IJ SOLN
25.0000 mg | Freq: Once | INTRAMUSCULAR | Status: AC
  Administered 2016-06-07: 25 mg via INTRAVENOUS
  Filled 2016-06-07: qty 1

## 2016-06-07 MED ORDER — AMOXICILLIN-POT CLAVULANATE 875-125 MG PO TABS
1.0000 | ORAL_TABLET | Freq: Once | ORAL | Status: AC
  Administered 2016-06-07: 1 via ORAL
  Filled 2016-06-07: qty 1

## 2016-06-07 MED ORDER — SODIUM CHLORIDE 0.9 % IV BOLUS (SEPSIS)
1000.0000 mL | Freq: Once | INTRAVENOUS | Status: AC
  Administered 2016-06-07: 1000 mL via INTRAVENOUS

## 2016-06-07 MED ORDER — HYDRALAZINE HCL 25 MG PO TABS
50.0000 mg | ORAL_TABLET | Freq: Once | ORAL | Status: AC
  Administered 2016-06-07: 50 mg via ORAL
  Filled 2016-06-07: qty 2

## 2016-06-07 MED ORDER — METOCLOPRAMIDE HCL 5 MG/ML IJ SOLN
10.0000 mg | Freq: Once | INTRAMUSCULAR | Status: AC
  Administered 2016-06-07: 10 mg via INTRAVENOUS
  Filled 2016-06-07: qty 2

## 2016-06-07 MED ORDER — AMOXICILLIN-POT CLAVULANATE 875-125 MG PO TABS: 1.0000 | ORAL_TABLET | Freq: Two times a day (BID) | ORAL | 0 refills | Status: DC

## 2016-06-07 MED ORDER — AMLODIPINE BESYLATE 5 MG PO TABS
10.0000 mg | ORAL_TABLET | Freq: Once | ORAL | Status: AC
  Administered 2016-06-07: 10 mg via ORAL
  Filled 2016-06-07: qty 2

## 2016-06-07 MED ORDER — HYDRALAZINE HCL 50 MG PO TABS: 50.0000 mg | ORAL_TABLET | Freq: Two times a day (BID) | ORAL | 0 refills | Status: DC

## 2016-06-07 NOTE — ED Notes (Signed)
Neuro at bedside case not to be felt that this was a stroke no TpA no stroke like symptoms other than some sensory noted
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
## 2016-06-07 NOTE — Discharge Instructions (Signed)
Need to go to ED via [facility] for r/o CVA

## 2016-06-07 NOTE — Progress Notes (Signed)
*  Preliminary Results* Left lower extremity venous duplex completed. Left lower extremity is negative for deep vein thrombosis. There is no evidence of left Baker's cyst.  06/07/2016 4:40 PM  Gertie FeyMichelle Adara Kittle, BS, RVT, RDCS, RDMS

## 2016-06-07 NOTE — Code Documentation (Signed)
51[facility] as a transfer from urgent care.  Patient reporting left face pain and left sided weakness.  History varies from patient and wife - patient reports waking up from a nap at 1200 with left sided weakness then later states he took a nap at 1200.  Patient's wife states patient has been sleeping all day, and that patient had c/o facial pain this morning, and she noticed left facial swelling when he woke up from his nap.  LKW unclear, likely 06/06/2016.  Code stroke activated on arrival to the ED.  Patient to CT.  Stroke team to the bedside.  NIHSS 1, see documentation for details and code stroke times.  Patient with subjective decreased sensation on the left side on exam.  Patient reports not feeling pinprick, however, noted to flinch with pinprick to face.  Patient reports pain on left side during exam.  Dr. Roxy Mannsster at the bedside.  No acute stroke treatment at this time.  Code stroke canceled.  Of note, patient hypertensive and reports not taking BP medications x 1 week.  Bedside handoff with ED RN Jon GillsAlexis.

## 2016-06-07 NOTE — ED Triage Notes (Signed)
Pt arrives awake and alert weakness noted in L arm and L leg pt also unable to smile completely due to pain in the L side

## 2016-06-07 NOTE — ED Provider Notes (Signed)
CSN: [id number]     Arrival date & time 06/07/16  1408 History   None    Chief Complaint  Patient presents with  . Hypertension   (Consider location/radiation/quality/duration/timing/severity/associated sxs/prior Treatment) Patient c/o left facial discomfort numbness and swelling.  He c/o swelling on his left side and weakness in the left lower extremity.  He has been off BP medicine for about a week.  He has hx of CVA x 2.     The history is provided by the patient.  Hypertension  This is a new problem. The problem occurs constantly. The problem has not changed since onset.Nothing aggravates the symptoms.    Past Medical History:  Diagnosis Date  . Abnormal EKG   . Cardiomegaly   . Family history of heart disease   . H/O medication noncompliance   . Hyperlipidemia   . Hypertension   . Stroke (HCC) 11/07/2015  . Tobacco abuse    Past Surgical History:  Procedure Laterality Date  . ANKLE FRACTURE SURGERY    . NM MYOCAR PERF WALL MOTION  06/28/2011   protocol Bruce, normal perfusion nin all regions, post stress EF 57%,, exercise cap  . TRANSTHORACIC ECHOCARDIOGRAM  06/28/2011   EF=55%, Proximal septal thickening, borderline LA enlargement, boarderline aortic root dialation   Family History  Problem Relation Age of Onset  . Hypertension Mother   . Diabetes Mother   . Stroke Mother   . Hypertension Father   . Diabetes Father   . Stroke Father   . Hypertension Sister   . Stroke Sister   . Diabetes Sister   . Hyperlipidemia Maternal Grandmother   . Diabetes Maternal Grandmother   . Asthma Daughter   . Asthma Son    Social History  Substance Use Topics  . Smoking status: Former Smoker    Packs/day: 0.25    Years: 15.00    Types: Cigarettes    Quit date: 11/06/2015  . Smokeless tobacco: Never Used     Comment: quit in Juy 2015  . Alcohol use 7.2 oz/week    12 Cans of beer per week     Comment: 12 pack on weekends     Review of Systems  HENT: Negative.    Eyes: Negative.   Respiratory: Negative.   Cardiovascular: Negative.   Gastrointestinal: Negative.   Endocrine: Negative.   Genitourinary: Negative.   Musculoskeletal: Negative.   Allergic/Immunologic: Negative.   Neurological: Positive for weakness and numbness.  Hematological: Negative.   Psychiatric/Behavioral: Negative.     Allergies  Patient has no known allergies.  Home Medications   Prior to Admission medications   Medication Sig Start Date End Date Taking? Authorizing Provider  amLODipine (NORVASC) 10 MG tablet Take 1 tablet (10 mg total) by mouth daily. 08/27/13   Robbie Lis M, PA-C  amoxicillin-clavulanate (AUGMENTIN) 500-125 MG tablet Take 1 tablet (500 mg total) by mouth 2 (two) times daily. 02/26/16   Arrien, York Ram, MD  aspirin EC 325 MG tablet Take 325 mg by mouth daily.    [provider]  buPROPion (WELLBUTRIN SR) 100 MG 12 hr tablet Take 100 mg by mouth 2 (two) times daily.    [provider]  hydrALAZINE (APRESOLINE) 50 MG tablet Take 50 mg by mouth 2 (two) times daily.    [provider]  lisinopril-hydrochlorothiazide (PRINZIDE,ZESTORETIC) 20-12.5 MG per tablet Take 2 tablets by mouth daily. 08/27/13   Robbie Lis M, PA-C  paliperidone (INVEGA) 6 MG 24 hr tablet Take 6  mg by mouth at bedtime.    [provider]   Meds Ordered and Administered this Visit  Medications - No data to display  BP (!) 186/120   Pulse (!) 58   Temp 98.7 F (37.1 C) (Oral)   Resp 14   SpO2 99%  No data found.   Physical Exam  Constitutional: He appears well-developed and well-nourished.  HENT:  Head: Normocephalic and atraumatic.  Right Ear: External ear normal.  Left Ear: External ear normal.  Mouth/Throat: Oropharynx is clear and moist.  Eyes: Conjunctivae and EOM are normal. Pupils are equal, round, and reactive to light.  Neck: Normal range of motion. Neck supple.  Cardiovascular: Normal rate, regular rhythm and  normal heart sounds.   Pulmonary/Chest: Effort normal and breath sounds normal.  Neurological:  Numbness with touch left face and left lower extremity.   Nursing note and vitals reviewed.   Urgent Care Course     Procedures (including critical care time)  Labs Review Labs Reviewed - No data to display  Imaging Review No results found.   Visual Acuity Review  Right Eye Distance:   Left Eye Distance:   Bilateral Distance:    Right Eye Near:   Left Eye Near:    Bilateral Near:         MDM   1. Numbness and tingling of left side of face   2. Hypertensive crisis   3. Weakness of left leg    Transfer to ED via [facility] for higher level of care.      Deatra CanterOxford, Breyah Akhter J, FNP 06/07/16 1454
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
## 2016-06-07 NOTE — ED Notes (Signed)
Pt being transferred to ED via [facility] for further evaluation for possible Stroke.  Report was called to the ED Charge RN.

## 2016-06-07 NOTE — Consult Note (Signed)
Requesting Physician: Dr. Criss Alvine    Chief Complaint: left leg pain  History obtained from:  Patient     HPI:                                                                                                                                         Thomas [name] is an 51 y.o. male brought to the hospital secondary to having episode of left facial pain was at PCP. On arrival patient was complaining of left facial pain and left leg pain along with left leg weakness. CT of head was obtained which showed no acute or chronic abnormalities. Patient's main complaint at this point is left leg pain. In talking to wife his last known normal was unknown secondary to the fact that he was sleeping a lot yesterday and apparently did not complain of anything this morning but when he woke up from his nap at approximately 11/20/2009 he complained of left facial pain. At this time he is no left facial pain but just left leg pain. Exam shows multiple inconsistencies  Date last known well: Unable to determine Time last known well: Unable to determine tPA Given: No: minimal symptoms   Past Medical History:  Diagnosis Date  . Abnormal EKG   . Cardiomegaly   . Family history of heart disease   . H/O medication noncompliance   . Hyperlipidemia   . Hypertension   . Stroke (HCC) 11/07/2015  . Tobacco abuse     Past Surgical History:  Procedure Laterality Date  . ANKLE FRACTURE SURGERY    . NM MYOCAR PERF WALL MOTION  06/28/2011   protocol Bruce, normal perfusion nin all regions, post stress EF 57%,, exercise cap  . TRANSTHORACIC ECHOCARDIOGRAM  06/28/2011   EF=55%, Proximal septal thickening, borderline LA enlargement, boarderline aortic root dialation    Family History  Problem Relation Age of Onset  . Hypertension Mother   . Diabetes Mother   . Stroke Mother   . Hypertension Father   . Diabetes Father   . Stroke Father   . Hypertension Sister   . Stroke Sister   . Diabetes Sister   .  Hyperlipidemia Maternal Grandmother   . Diabetes Maternal Grandmother   . Asthma Daughter   . Asthma Son    Social History:  reports that he quit smoking about 7 months ago. His smoking use included Cigarettes. He has a 3.75 pack-year smoking history. He has never used smokeless tobacco. He reports that he drinks about 7.2 oz of alcohol per week . He reports that he does not use drugs.  Allergies: No Known Allergies  Medications:  No current facility-administered medications for this encounter.    Current Outpatient Prescriptions  Medication Sig Dispense Refill  . amLODipine (NORVASC) 10 MG tablet Take 1 tablet (10 mg total) by mouth daily. 90 tablet 3  . amoxicillin-clavulanate (AUGMENTIN) 500-125 MG tablet Take 1 tablet (500 mg total) by mouth 2 (two) times daily. 14 tablet 0  . aspirin EC 325 MG tablet Take 325 mg by mouth daily.    Marland Kitchen buPROPion (WELLBUTRIN SR) 100 MG 12 hr tablet Take 100 mg by mouth 2 (two) times daily.    . hydrALAZINE (APRESOLINE) 50 MG tablet Take 50 mg by mouth 2 (two) times daily.    Marland Kitchen lisinopril-hydrochlorothiazide (PRINZIDE,ZESTORETIC) 20-12.5 MG per tablet Take 2 tablets by mouth daily. 180 tablet 3  . paliperidone (INVEGA) 6 MG 24 hr tablet Take 6 mg by mouth at bedtime.       ROS:                                                                                                                                       History obtained from the patient  General ROS: negative for - chills, fatigue, fever, night sweats, weight gain or weight loss Psychological ROS: negative for - behavioral disorder, hallucinations, memory difficulties, mood swings or suicidal ideation Ophthalmic ROS: negative for - blurry vision, double vision, eye pain or loss of vision ENT ROS: negative for - epistaxis, nasal discharge, oral lesions, sore throat, tinnitus or  vertigo Allergy and Immunology ROS: negative for - hives or itchy/watery eyes Hematological and Lymphatic ROS: negative for - bleeding problems, bruising or swollen lymph nodes Endocrine ROS: negative for - galactorrhea, hair pattern changes, polydipsia/polyuria or temperature intolerance Respiratory ROS: negative for - cough, hemoptysis, shortness of breath or wheezing Cardiovascular ROS: negative for - chest pain, dyspnea on exertion, edema or irregular heartbeat Gastrointestinal ROS: negative for - abdominal pain, diarrhea, hematemesis, nausea/vomiting or stool incontinence Genito-Urinary ROS: negative for - dysuria, hematuria, incontinence or urinary frequency/urgency Musculoskeletal ROS: negative for - joint swelling or muscular weakness Neurological ROS: as noted in HPI Dermatological ROS: negative for rash and skin lesion changes  Neurologic Examination:                                                                                                      Blood pressure (!) 210/134, pulse (!) 56, resp. rate 10, height 6\' 4"  (1.93 m), weight 95.3 kg (210 lb), SpO2 98 %.  HEENT-  Normocephalic, no lesions,  without obvious abnormality.  Normal external eye and conjunctiva.  Normal TM's bilaterally.  Normal auditory canals and external ears. Normal external nose, mucus membranes and septum.  Normal pharynx. Cardiovascular- S1, S2 normal, pulses palpable throughout   Lungs- chest clear, no wheezing, rales, normal symmetric air entry Abdomen- normal findings: bowel sounds normal Extremities- no edema Lymph-no adenopathy palpable Musculoskeletal-no joint tenderness, deformity or swelling Skin-warm and dry, no hyperpigmentation, vitiligo, or suspicious lesions  Neurological Examination Mental Status: Patient is alert oriented speech is fluent, patient is able to follow commands or difficulties Cranial Nerves: II:  Visual fields grossly normal,  III,IV, VI: ptosis not present, extra-ocular  motions intact bilaterally, pupils equal, round, reactive to light and accommodation V,VII: smile symmetric, patient states that he does not feel pinprick on left-sided his face however while testing he did wince to the pinprick on the left side of his face  VIII: hearing normal bilaterally IX,X: uvula rises symmetrically XI: bilateral shoulder shrug XII: midline tongue extension Motor: Right : Upper extremity   5/5    Left:     Upper extremity   5/5  Lower extremity   5/5     Lower extremity   5/5 --Patient shows significant give way weakness on his left leg, hip flexion, dorsi and plantar flexion. He also complains of left leg pain Tone and bulk:normal tone throughout; no atrophy noted Sensory: Pinprick and light touch intact throughout, bilaterally Deep Tendon Reflexes: 2+ and symmetric throughout Plantars: Right: downgoing   Left: downgoing Cerebellar: normal finger-to-nose,  and normal heel-to-shin test Gait: Not tested       Lab Results: Basic Metabolic Panel:  Recent Labs Lab 06/07/16 1542  NA 140  K 3.7  CL 102  GLUCOSE 80  BUN 16  CREATININE 1.80*    Liver Function Tests: No results for input(s): AST, ALT, ALKPHOS, BILITOT, PROT, ALBUMIN in the last 168 hours. No results for input(s): LIPASE, AMYLASE in the last 168 hours. No results for input(s): AMMONIA in the last 168 hours.  CBC:  Recent Labs Lab 06/07/16 1542  HGB 15.6  HCT 46.0    Cardiac Enzymes: No results for input(s): CKTOTAL, CKMB, CKMBINDEX, TROPONINI in the last 168 hours.  Lipid Panel: No results for input(s): CHOL, TRIG, HDL, CHOLHDL, VLDL, LDLCALC in the last 168 hours.  CBG: No results for input(s): GLUCAP in the last 168 hours.  Microbiology: Results for orders placed or performed during the hospital encounter of 02/23/16  Urine culture     Status: Abnormal   Collection Time: 02/23/16 12:18 AM  Result Value Ref Range Status   Specimen Description URINE, RANDOM  Final    Special Requests ADDED [id number]  Final   Culture MULTIPLE SPECIES PRESENT, SUGGEST RECOLLECTION (A)  Final   Report Status 02/25/2016 FINAL  Final  Blood culture (routine x 2)     Status: Abnormal   Collection Time: 02/24/16  2:20 AM  Result Value Ref Range Status   Specimen Description BLOOD RIGHT ARM  Final   Special Requests BOTTLES DRAWN AEROBIC AND ANAEROBIC 5ML  Final   Culture  Setup Time   Final    GRAM POSITIVE COCCI IN CHAINS IN PAIRS ANAEROBIC BOTTLE ONLY CRITICAL RESULT CALLED TO, READ BACK BY AND VERIFIED WITH: Lytle ButteM MACCIA PHARMD 2014 02/24/16 A BROWNING    Culture STREPTOCOCCUS PNEUMONIAE (A)  Final   Report Status 02/26/2016 FINAL  Final   Organism ID, Bacteria STREPTOCOCCUS PNEUMONIAE  Final  Susceptibility   Streptococcus pneumoniae - MIC*    ERYTHROMYCIN <=0.12 SENSITIVE Sensitive     LEVOFLOXACIN 1 SENSITIVE Sensitive     PENICILLIN <=0.06 SENSITIVE Sensitive     CEFTRIAXONE <=0.12 SENSITIVE Sensitive     * STREPTOCOCCUS PNEUMONIAE  Blood Culture ID Panel (Reflexed)     Status: Abnormal   Collection Time: 02/24/16  2:20 AM  Result Value Ref Range Status   Enterococcus species NOT DETECTED NOT DETECTED Final   Listeria monocytogenes NOT DETECTED NOT DETECTED Final   Staphylococcus species NOT DETECTED NOT DETECTED Final   Staphylococcus aureus NOT DETECTED NOT DETECTED Final   Streptococcus species DETECTED (A) NOT DETECTED Final    Comment: CRITICAL RESULT CALLED TO, READ BACK BY AND VERIFIED WITH: Lytle Butte PHARMD 2014 02/24/16 A BROWNING    Streptococcus agalactiae NOT DETECTED NOT DETECTED Final   Streptococcus pneumoniae DETECTED (A) NOT DETECTED Final    Comment: CRITICAL RESULT CALLED TO, READ BACK BY AND VERIFIED WITH: Lytle Butte PHARMD 2014 02/24/16 A BROWNING    Streptococcus pyogenes NOT DETECTED NOT DETECTED Final   Acinetobacter baumannii NOT DETECTED NOT DETECTED Final   Enterobacteriaceae species NOT DETECTED NOT DETECTED Final   Enterobacter  cloacae complex NOT DETECTED NOT DETECTED Final   Escherichia coli NOT DETECTED NOT DETECTED Final   Klebsiella oxytoca NOT DETECTED NOT DETECTED Final   Klebsiella pneumoniae NOT DETECTED NOT DETECTED Final   Proteus species NOT DETECTED NOT DETECTED Final   Serratia marcescens NOT DETECTED NOT DETECTED Final   Haemophilus influenzae NOT DETECTED NOT DETECTED Final   Neisseria meningitidis NOT DETECTED NOT DETECTED Final   Pseudomonas aeruginosa NOT DETECTED NOT DETECTED Final   Candida albicans NOT DETECTED NOT DETECTED Final   Candida glabrata NOT DETECTED NOT DETECTED Final   Candida krusei NOT DETECTED NOT DETECTED Final   Candida parapsilosis NOT DETECTED NOT DETECTED Final   Candida tropicalis NOT DETECTED NOT DETECTED Final  Blood culture (routine x 2)     Status: None   Collection Time: 02/24/16  2:30 AM  Result Value Ref Range Status   Specimen Description BLOOD LEFT HAND  Final   Special Requests IN PEDIATRIC BOTTLE  Final   Culture NO GROWTH 5 DAYS  Final   Report Status 02/29/2016 FINAL  Final  Culture, blood (Routine X 2) w Reflex to ID Panel     Status: None   Collection Time: 02/25/16  1:00 PM  Result Value Ref Range Status   Specimen Description BLOOD RIGHT ASSIST CONTROL  Final   Special Requests BOTTLES DRAWN AEROBIC AND ANAEROBIC 5CC EA  Final   Culture NO GROWTH 5 DAYS  Final   Report Status 03/01/2016 FINAL  Final    Coagulation Studies: No results for input(s): LABPROT, INR in the last 72 hours.  Imaging: Ct Head Code Stroke W/o Cm  Result Date: 06/07/2016 CLINICAL DATA:  Code stroke. Last seen normal at noon today. Mouth pain and swelling, off hypertensive medication. EXAM: CT HEAD WITHOUT CONTRAST TECHNIQUE: Contiguous axial images were obtained from the base of the skull through the vertex without intravenous contrast. COMPARISON:  MRI of the head February 24, 2016 FINDINGS: BRAIN: No intraparenchymal hemorrhage, mass effect nor midline shift. Old  small cerebellar infarcts better seen on prior MRI. The ventricles and sulci are normal. Tiny perivascular spaces basal ganglia versus old lacunar infarcts. No acute large vascular territory infarcts. No abnormal extra-axial fluid collections. Basal cisterns are patent. VASCULAR: Trace calcific atherosclerosis of the  carotid siphon. SKULL/SOFT TISSUES: No skull fracture. No significant soft tissue swelling. ORBITS/SINUSES: The included ocular globes and orbital contents are normal.Moderate LEFT maxillary sinus mucosal thickening without air-fluid levels. Mastoid air cells are well aerated. OTHER: None. ASPECTS [facility] Stroke Program Early CT Score) - Ganglionic level infarction (caudate, lentiform nuclei, internal capsule, insula, M1-M3 cortex): 7 - Supraganglionic infarction (M4-M6 cortex): 3 Total score (0-10 with 10 being normal): 10 IMPRESSION: 1. No acute intracranial process. 2. Stable examination including old small cerebellar infarcts. 3. ASPECTS is 10. Dr. Roxy Manns, Neurology paged via [facility] secure hospital paging system on 06/07/2016 at 3:46 pm, awaiting return call. Electronically Signed   By: Awilda Metro M.D.   On: 06/07/2016 15:51       Assessment and plan discussed with with attending physician and they are in agreement.    Felicie Morn PA-C [facility] [phone]  06/07/2016, 3:56 PM   Assessment: 51 y.o. male brought to the [facility] department as a code stroke secondary to complaining of left facial pain and left-sided weakness that his PCP. Upon arrival patient's main complaint was left leg pain. CT showed no chronic or acute abnormalities. On exam there was multiple inconsistencies such as wincing to pain on the left side of his face and stating he could not feel it, give way weakness, positive Hoover's on the left. At this time do not believe he has had any form of a CVA. Would not continue with any further neurological diagnostic workup. ER may want to look further into  his leg pain and facial pain.  Stroke Risk Factors - hyperlipidemia, hypertension and smoking

## 2016-06-07 NOTE — ED Notes (Signed)
Pt back from CT. Neuro at bedside.

## 2016-06-07 NOTE — ED Notes (Signed)
ER MD to bedside CODE Stroke activated based on that pt. States that he woke up at noon after his nap and noticed L sided weakness

## 2016-11-27 ENCOUNTER — Encounter (HOSPITAL_COMMUNITY): Payer: Self-pay

## 2016-11-27 ENCOUNTER — Emergency Department (HOSPITAL_COMMUNITY)
Admission: EM | Admit: 2016-11-27 | Discharge: 2016-11-27 | Disposition: A | Discharge status: Home / Self Care | Payer: Medicaid Other | Attending: Emergency Medicine | Admitting: Emergency Medicine

## 2016-11-27 DIAGNOSIS — Z87891 Personal history of nicotine dependence: Secondary | ICD-10-CM | POA: Insufficient documentation

## 2016-11-27 DIAGNOSIS — Z7982 Long term (current) use of aspirin: Secondary | ICD-10-CM | POA: Insufficient documentation

## 2016-11-27 DIAGNOSIS — I10 Essential (primary) hypertension: Secondary | ICD-10-CM | POA: Diagnosis not present

## 2016-11-27 DIAGNOSIS — Z79899 Other long term (current) drug therapy: Secondary | ICD-10-CM | POA: Diagnosis not present

## 2016-11-27 DIAGNOSIS — K0889 Other specified disorders of teeth and supporting structures: Secondary | ICD-10-CM

## 2016-11-27 MED ORDER — OXYCODONE-ACETAMINOPHEN 5-325 MG PO TABS
1.0000 | ORAL_TABLET | Freq: Once | ORAL | Status: AC
  Administered 2016-11-27: 1 via ORAL
  Filled 2016-11-27: qty 1

## 2016-11-27 MED ORDER — NAPROXEN 375 MG PO TABS: 375.0000 mg | ORAL_TABLET | Freq: Two times a day (BID) | ORAL | 0 refills | Status: DC

## 2016-11-27 MED ORDER — AMOXICILLIN 500 MG PO CAPS: 500.0000 mg | ORAL_CAPSULE | Freq: Three times a day (TID) | ORAL | 0 refills | Status: DC

## 2016-11-27 NOTE — ED Notes (Signed)
PT states understanding of care given, follow up care, and medication prescribed. PT ambulated from ED to car with a steady gait. 

## 2016-11-27 NOTE — ED Triage Notes (Signed)
Pt states that he has dental pain and some mild swelling to the R side of his face.

## 2016-11-27 NOTE — ED Provider Notes (Signed)
[facility] Provider Note   CSN: [id number] Arrival date & time: 11/27/16  2041     History   Chief Complaint Chief Complaint  Patient presents with  . Dental Pain    HPI Thomas [name] is a 51 y.o. male.  Patient complaining of right upper tooth pain x 1 day. He states his dentist would not see him until he had received antibiotic for infection.   The history is provided by the patient. No language interpreter was used.  Dental Pain   This is a new problem. The current episode started yesterday. The problem occurs constantly. The problem has been gradually worsening. The pain is moderate. He has tried nothing for the symptoms.    Past Medical History:  Diagnosis Date  . Abnormal EKG   . Cardiomegaly   . Family history of heart disease   . H/O medication noncompliance   . Hyperlipidemia   . Hypertension   . Stroke (HCC) 11/07/2015  . Tobacco abuse     Patient Active Problem List   Diagnosis Date Noted  . Altered mental status 02/24/2016  . Urinary tract infection without hematuria 02/24/2016  . Acute encephalopathy 02/24/2016  . Chest pain 02/24/2016  . History of stroke   . Blurry vision, right eye 11/08/2015  . Acute cerebrovascular accident (CVA) (HCC) 11/08/2015  . AKI (acute kidney injury) (HCC) 11/07/2015  . Hyperlipidemia 01/26/2014  . TOBACCO ABUSE 04/19/2008  . HYPERTENSION, BENIGN ESSENTIAL 01/22/2005    Past Surgical History:  Procedure Laterality Date  . ANKLE FRACTURE SURGERY    . NM MYOCAR PERF WALL MOTION  06/28/2011   protocol Bruce, normal perfusion nin all regions, post stress EF 57%,, exercise cap 13METS  . TRANSTHORACIC ECHOCARDIOGRAM  06/28/2011   EF=55%, Proximal septal thickening, borderline LA enlargement, boarderline aortic root dialation       Home Medications    Prior to Admission medications   Medication Sig Start Date End Date Taking? Authorizing Provider  amLODipine (NORVASC)  10 MG tablet Take 1 tablet (10 mg total) by mouth daily. 06/07/16   Pricilla LovelessGoldston, Scott, MD  amoxicillin-clavulanate (AUGMENTIN) 875-125 MG tablet Take 1 tablet by mouth 2 (two) times daily. One po bid x 7 days 06/07/16   Pricilla LovelessGoldston, Scott, MD  aspirin EC 325 MG tablet Take 325 mg by mouth daily.    [provider]  buPROPion (WELLBUTRIN SR) 100 MG 12 hr tablet Take 100 mg by mouth 2 (two) times daily.    [provider]  hydrALAZINE (APRESOLINE) 50 MG tablet Take 1 tablet (50 mg total) by mouth 2 (two) times daily. 06/07/16   Pricilla LovelessGoldston, Scott, MD  hydrochlorothiazide (HYDRODIURIL) 25 MG tablet Take 1 tablet (25 mg total) by mouth daily. 06/07/16   Pricilla LovelessGoldston, Scott, MD  Multiple Vitamins-Minerals (ONE-A-DAY MENS HEALTH FORMULA) TABS Take 1 tablet by mouth daily.    [provider]  paliperidone (INVEGA) 6 MG 24 hr tablet Take 6 mg by mouth at bedtime.    [provider]    Family History Family History  Problem Relation Age of Onset  . Hypertension Mother   . Diabetes Mother   . Stroke Mother   . Hypertension Father   . Diabetes Father   . Stroke Father   . Hypertension Sister   . Stroke Sister   . Diabetes Sister   . Hyperlipidemia Maternal Grandmother   . Diabetes Maternal Grandmother   . Asthma Daughter   . Asthma Son  Social History Social History   Tobacco Use  . Smoking status: Former Smoker    Packs/day: 0.25    Years: 15.00    Pack years: 3.75    Types: Cigarettes    Last attempt to quit: 11/06/2015    Years since quitting: 1.0  . Smokeless tobacco: Never Used  . Tobacco comment: quit in Juy 2015  Substance Use Topics  . Alcohol use: Yes    Alcohol/week: 7.2 oz    Types: 12 Cans of beer per week    Comment: 12 pack on weekends   . Drug use: No     Allergies   Patient has no known allergies.   Review of Systems Review of Systems  HENT: Positive for dental problem.   All other systems reviewed and are negative.    Physical  Exam Updated Vital Signs BP (!) 189/119 (BP Location: Right Arm)   Pulse (!) 58   Temp 98.6 F (37 C) (Oral)   Resp 16   Ht 6\' 3"  (1.905 m)   Wt 102.1 kg (225 lb)   SpO2 100%   BMI 28.12 kg/m   Physical Exam  Constitutional: He appears well-developed and well-nourished.  HENT:  Head: Normocephalic and atraumatic.  Mouth/Throat:    Eyes: Conjunctivae are normal.  Neck: Neck supple.  Cardiovascular: Normal rate and regular rhythm.  No murmur heard. Pulmonary/Chest: Effort normal and breath sounds normal. No respiratory distress.  Abdominal: Soft. There is no tenderness.  Musculoskeletal: He exhibits no edema.  Lymphadenopathy:    He has no cervical adenopathy.  Neurological: He is alert.  Skin: Skin is warm and dry.  Psychiatric: He has a normal mood and affect.  Nursing note and vitals reviewed.    ED Treatments / Results  Labs (all labs ordered are listed, but only abnormal results are displayed) Labs Reviewed - No data to display  EKG  EKG Interpretation None       Radiology No results found.  Procedures Procedures (including critical care time)  Medications Ordered in ED Medications  oxyCODONE-acetaminophen (PERCOCET/ROXICET) 5-325 MG per tablet 1 tablet (1 tablet Oral Given 11/27/16 2119)     Initial Impression / Assessment and Plan / ED Course  I have reviewed the triage vital signs and the nursing notes.  Pertinent labs & imaging results that were available during my care of the patient were reviewed by me and considered in my medical decision making (see chart for details).     Patient with dentalgia.  No abscess requiring immediate incision and drainage.  Exam not concerning for Ludwig's angina or pharyngeal abscess.  Will treat with amoxicillin and NSAID. Pt instructed to follow-up with dentist.  Discussed return precautions. Pt safe for discharge.  Final Clinical Impressions(s) / ED Diagnoses   Final diagnoses:  Tooth pain    ED  Discharge Orders        Ordered    amoxicillin (AMOXIL) 500 MG capsule  3 times daily     11/27/16 2109    naproxen (NAPROSYN) 375 MG tablet  2 times daily     11/27/16 2109       Felicie MornSmith, Roosevelt Eimers, NP 11/27/16 2154    Gwyneth SproutPlunkett, Whitney, MD 11/27/16 2309

## 2017-05-03 ENCOUNTER — Encounter (HOSPITAL_COMMUNITY): Payer: Self-pay | Admitting: Family Medicine

## 2017-05-03 ENCOUNTER — Ambulatory Visit (HOSPITAL_COMMUNITY)
Admission: EM | Admit: 2017-05-03 | Discharge: 2017-05-03 | Disposition: A | Discharge status: Home / Self Care | Payer: Medicaid Other | Attending: Physician Assistant | Admitting: Physician Assistant

## 2017-05-03 DIAGNOSIS — K0889 Other specified disorders of teeth and supporting structures: Secondary | ICD-10-CM

## 2017-05-03 DIAGNOSIS — I10 Essential (primary) hypertension: Secondary | ICD-10-CM | POA: Diagnosis not present

## 2017-05-03 MED ORDER — CHLORHEXIDINE GLUCONATE 0.12 % MT SOLN: 15.0000 mL | Freq: Two times a day (BID) | OROMUCOSAL | 0 refills | Status: DC

## 2017-05-03 MED ORDER — KETOROLAC TROMETHAMINE 60 MG/2ML IM SOLN
60.0000 mg | Freq: Once | INTRAMUSCULAR | Status: AC
  Administered 2017-05-03: 60 mg via INTRAMUSCULAR

## 2017-05-03 MED ORDER — KETOROLAC TROMETHAMINE 60 MG/2ML IM SOLN
INTRAMUSCULAR | Status: AC
  Filled 2017-05-03: qty 2

## 2017-05-03 MED ORDER — MELOXICAM 7.5 MG PO TABS: 7.5000 mg | ORAL_TABLET | Freq: Every day | ORAL | 0 refills | Status: DC

## 2017-05-03 MED ORDER — PENICILLIN V POTASSIUM 500 MG PO TABS: 500.0000 mg | ORAL_TABLET | Freq: Four times a day (QID) | ORAL | 0 refills | Status: AC

## 2017-05-03 NOTE — Discharge Instructions (Addendum)
Start Penicillin as directed for dental abscess. Mobic for pain. Chlorhexidine mouthwash for oral hygiene. Follow up with dentist for further treatment and evaluation. If experiencing swelling of the throat, trouble breathing, trouble swallowing, leaning forward to breath, drooling, go to the emergency department for further evaluation.

## 2017-05-03 NOTE — ED Triage Notes (Signed)
Pt here for oral pain and swelling. He reports that he has a tooth that needs to be pulled.

## 2017-05-03 NOTE — ED Provider Notes (Signed)
[facility]    CSN: [id number] Arrival date & time: 05/03/17  1503     History   Chief Complaint Chief Complaint  Patient presents with  . Oral Swelling    HPI Thomas [name] is a 52 y.o. male.   52 year old male comes in for 2 day history of left upper dental pain. States part of his tooth broke off, and that is when the pain first started. He had some swelling to the left side of his face this morning that has since improved. Denies trouble swallowing, trouble breathing. Denies fever, chills, night sweats.  Patient hypertensive during triage. States due to the pain, did not take his antihypertensives today. Denies chest pain, shortness of breath, wheezing. Denies weakness, dizziness, syncope, confusion, imbalance.       Past Medical History:  Diagnosis Date  . Abnormal EKG   . Cardiomegaly   . Family history of heart disease   . H/O medication noncompliance   . Hyperlipidemia   . Hypertension   . Stroke (HCC) 11/07/2015  . Tobacco abuse     Patient Active Problem List   Diagnosis Date Noted  . Altered mental status 02/24/2016  . Urinary tract infection without hematuria 02/24/2016  . Acute encephalopathy 02/24/2016  . Chest pain 02/24/2016  . History of stroke   . Blurry vision, right eye 11/08/2015  . Acute cerebrovascular accident (CVA) (HCC) 11/08/2015  . AKI (acute kidney injury) (HCC) 11/07/2015  . Hyperlipidemia 01/26/2014  . TOBACCO ABUSE 04/19/2008  . HYPERTENSION, BENIGN ESSENTIAL 01/22/2005    Past Surgical History:  Procedure Laterality Date  . ANKLE FRACTURE SURGERY    . NM MYOCAR PERF WALL MOTION  06/28/2011   protocol Bruce, normal perfusion nin all regions, post stress EF 57%,, exercise cap  . TRANSTHORACIC ECHOCARDIOGRAM  06/28/2011   EF=55%, Proximal septal thickening, borderline LA enlargement, boarderline aortic root dialation       Home Medications    Prior to Admission medications   Medication Sig Start  Date End Date Taking? Authorizing Provider  amLODipine (NORVASC) 10 MG tablet Take 1 tablet (10 mg total) by mouth daily. 06/07/16   Pricilla Loveless, MD  aspirin EC 325 MG tablet Take 325 mg by mouth daily.    [provider]  buPROPion (WELLBUTRIN SR) 100 MG 12 hr tablet Take 100 mg by mouth 2 (two) times daily.    [provider]  chlorhexidine (PERIDEX) 0.12 % solution Use as directed 15 mLs in the mouth or throat 2 (two) times daily. 05/03/17   Cathie Hoops, Amy V, PA-C  hydrALAZINE (APRESOLINE) 50 MG tablet Take 1 tablet (50 mg total) by mouth 2 (two) times daily. 06/07/16   Pricilla Loveless, MD  hydrochlorothiazide (HYDRODIURIL) 25 MG tablet Take 1 tablet (25 mg total) by mouth daily. 06/07/16   Pricilla Loveless, MD  meloxicam (MOBIC) 7.5 MG tablet Take 1 tablet (7.5 mg total) by mouth daily. 05/03/17   Cathie Hoops, Amy V, PA-C  Multiple Vitamins-Minerals (ONE-A-DAY [employer] FORMULA) TABS Take 1 tablet by mouth daily.    [provider]  naproxen (NAPROSYN) 375 MG tablet Take 1 tablet (375 mg total) 2 (two) times daily by mouth. 11/27/16   Felicie Morn, NP  paliperidone (INVEGA) 6 MG 24 hr tablet Take 6 mg by mouth at bedtime.    [provider]  penicillin v potassium (VEETID) 500 MG tablet Take 1 tablet (500 mg total) by mouth 4 (four) times daily for 7 days. 05/03/17 05/10/17  Belinda FisherYu, Amy V, PA-C    Family History Family History  Problem Relation Age of Onset  . Hypertension Mother   . Diabetes Mother   . Stroke Mother   . Hypertension Father   . Diabetes Father   . Stroke Father   . Hypertension Sister   . Stroke Sister   . Diabetes Sister   . Hyperlipidemia Maternal Grandmother   . Diabetes Maternal Grandmother   . Asthma Daughter   . Asthma Son     Social History Social History   Tobacco Use  . Smoking status: Former Smoker    Packs/day: 0.25    Years: 15.00    Pack years: 3.75    Types: Cigarettes    Last attempt to quit: 11/06/2015    Years since  quitting: 1.4  . Smokeless tobacco: Never Used  . Tobacco comment: quit in Juy 2015  Substance Use Topics  . Alcohol use: Yes    Alcohol/week: 7.2 oz    Types: 12 Cans of beer per week    Comment: 12 pack on weekends   . Drug use: No     Allergies   Patient has no known allergies.   Review of Systems Review of Systems  Reason unable to perform ROS: See HPI as above.     Physical Exam Triage Vital Signs ED Triage Vitals  Enc Vitals Group     BP 05/03/17 1538 (!) 192/118     Pulse Rate 05/03/17 1538 69     Resp 05/03/17 1538 18     Temp 05/03/17 1538 98.2 F (36.8 C)     Temp Source 05/03/17 1538 Oral     SpO2 05/03/17 1538 98 %     Weight --      Height --      Head Circumference --      Peak Flow --      Pain Score 05/03/17 1545 8     Pain Loc --      Pain Edu? --      Excl. in GC? --    No data found.  Updated Vital Signs BP (!) 192/118 (BP Location: Right Arm) Comment: Notified Tina G  Pulse 69   Temp 98.2 F (36.8 C) (Oral)   Resp 18   SpO2 98%   Physical Exam  Constitutional: He is oriented to person, place, and time. He appears well-developed and well-nourished. No distress.  HENT:  Head: Normocephalic and atraumatic.  Mouth/Throat: Uvula is midline, oropharynx is clear and moist and mucous membranes are normal. Abnormal dentition. No tonsillar exudate.    Tenderness to palpation around left posterior gums.  Floor of mouth soft to palpation. No obvious facial swelling.   Eyes: Pupils are equal, round, and reactive to light. Conjunctivae are normal.  Neurological: He is alert and oriented to person, place, and time.     UC Treatments / Results  Labs (all labs ordered are listed, but only abnormal results are displayed) Labs Reviewed - No data to display  EKG None Radiology No results found.  Procedures Procedures (including critical care time)  Medications Ordered in UC Medications  ketorolac (TORADOL) injection 60 mg (has no  administration in time range)     Initial Impression / Assessment and Plan / UC Course  I have reviewed the triage vital signs and the nursing notes.  Pertinent labs & imaging results that were available during my care of the patient were reviewed by me and considered in my medical  decision making (see chart for details).    Start antibiotics for possible dental infection. Symptomatic treatment as needed. Discussed with patient symptoms can return if dental problem is not addressed. Follow up with dentist for further evaluation and treatment of dental pain. Resources given. Return precautions given.   Stressed importance of good hypertension control. Patient to take blood pressure medication at home. Return precautions given.   Final Clinical Impressions(s) / UC Diagnoses   Final diagnoses:  Pain, dental    ED Discharge Orders        Ordered    penicillin v potassium (VEETID) 500 MG tablet  4 times daily     05/03/17 1607    meloxicam (MOBIC) 7.5 MG tablet  Daily     05/03/17 1607    chlorhexidine (PERIDEX) 0.12 % solution  2 times daily     05/03/17 1607        Belinda Fisher, [place] 05/03/17 1613

## 2017-08-28 ENCOUNTER — Inpatient Hospital Stay (HOSPITAL_COMMUNITY)
Admission: EM | Admit: 2017-08-28 | Discharge: 2017-08-30 | DRG: 065 | Disposition: A | Discharge status: Home / Self Care | Payer: Medicaid Other | Attending: Internal Medicine | Admitting: Internal Medicine

## 2017-08-28 ENCOUNTER — Encounter (HOSPITAL_COMMUNITY): Payer: Self-pay | Admitting: Emergency Medicine

## 2017-08-28 ENCOUNTER — Emergency Department (HOSPITAL_COMMUNITY): Payer: Medicaid Other

## 2017-08-28 DIAGNOSIS — G8194 Hemiplegia, unspecified affecting left nondominant side: Secondary | ICD-10-CM | POA: Diagnosis present

## 2017-08-28 DIAGNOSIS — N289 Disorder of kidney and ureter, unspecified: Secondary | ICD-10-CM

## 2017-08-28 DIAGNOSIS — T461X6A Underdosing of calcium-channel blockers, initial encounter: Secondary | ICD-10-CM | POA: Diagnosis present

## 2017-08-28 DIAGNOSIS — F17200 Nicotine dependence, unspecified, uncomplicated: Secondary | ICD-10-CM | POA: Diagnosis present

## 2017-08-28 DIAGNOSIS — I129 Hypertensive chronic kidney disease with stage 1 through stage 4 chronic kidney disease, or unspecified chronic kidney disease: Secondary | ICD-10-CM | POA: Diagnosis present

## 2017-08-28 DIAGNOSIS — Z8349 Family history of other endocrine, nutritional and metabolic diseases: Secondary | ICD-10-CM

## 2017-08-28 DIAGNOSIS — F17210 Nicotine dependence, cigarettes, uncomplicated: Secondary | ICD-10-CM | POA: Diagnosis present

## 2017-08-28 DIAGNOSIS — Z9114 Patient's other noncompliance with medication regimen: Secondary | ICD-10-CM

## 2017-08-28 DIAGNOSIS — T502X6A Underdosing of carbonic-anhydrase inhibitors, benzothiadiazides and other diuretics, initial encounter: Secondary | ICD-10-CM | POA: Diagnosis present

## 2017-08-28 DIAGNOSIS — I6329 Cerebral infarction due to unspecified occlusion or stenosis of other precerebral arteries: Principal | ICD-10-CM | POA: Diagnosis present

## 2017-08-28 DIAGNOSIS — Z8673 Personal history of transient ischemic attack (TIA), and cerebral infarction without residual deficits: Secondary | ICD-10-CM

## 2017-08-28 DIAGNOSIS — E785 Hyperlipidemia, unspecified: Secondary | ICD-10-CM | POA: Diagnosis present

## 2017-08-28 DIAGNOSIS — I6789 Other cerebrovascular disease: Secondary | ICD-10-CM | POA: Diagnosis present

## 2017-08-28 DIAGNOSIS — N179 Acute kidney failure, unspecified: Secondary | ICD-10-CM | POA: Diagnosis present

## 2017-08-28 DIAGNOSIS — Z823 Family history of stroke: Secondary | ICD-10-CM

## 2017-08-28 DIAGNOSIS — Z8249 Family history of ischemic heart disease and other diseases of the circulatory system: Secondary | ICD-10-CM

## 2017-08-28 DIAGNOSIS — F319 Bipolar disorder, unspecified: Secondary | ICD-10-CM | POA: Diagnosis present

## 2017-08-28 DIAGNOSIS — I639 Cerebral infarction, unspecified: Secondary | ICD-10-CM

## 2017-08-28 DIAGNOSIS — N182 Chronic kidney disease, stage 2 (mild): Secondary | ICD-10-CM

## 2017-08-28 DIAGNOSIS — R4781 Slurred speech: Secondary | ICD-10-CM | POA: Diagnosis present

## 2017-08-28 DIAGNOSIS — I429 Cardiomyopathy, unspecified: Secondary | ICD-10-CM | POA: Diagnosis present

## 2017-08-28 DIAGNOSIS — Y92009 Unspecified place in unspecified non-institutional (private) residence as the place of occurrence of the external cause: Secondary | ICD-10-CM

## 2017-08-28 DIAGNOSIS — R471 Dysarthria and anarthria: Secondary | ICD-10-CM | POA: Diagnosis present

## 2017-08-28 DIAGNOSIS — H538 Other visual disturbances: Secondary | ICD-10-CM | POA: Diagnosis present

## 2017-08-28 DIAGNOSIS — R29701 NIHSS score 1: Secondary | ICD-10-CM | POA: Diagnosis present

## 2017-08-28 DIAGNOSIS — I161 Hypertensive emergency: Secondary | ICD-10-CM | POA: Diagnosis present

## 2017-08-28 DIAGNOSIS — T465X6A Underdosing of other antihypertensive drugs, initial encounter: Secondary | ICD-10-CM | POA: Diagnosis present

## 2017-08-28 DIAGNOSIS — N189 Chronic kidney disease, unspecified: Secondary | ICD-10-CM

## 2017-08-28 DIAGNOSIS — Z7982 Long term (current) use of aspirin: Secondary | ICD-10-CM

## 2017-08-28 DIAGNOSIS — I10 Essential (primary) hypertension: Secondary | ICD-10-CM

## 2017-08-28 DIAGNOSIS — R001 Bradycardia, unspecified: Secondary | ICD-10-CM | POA: Diagnosis present

## 2017-08-28 HISTORY — DX: Chronic kidney disease, stage 2 (mild): N18.2

## 2017-08-28 HISTORY — DX: Bipolar disorder, unspecified: F31.9

## 2017-08-28 LAB — I-STAT CHEM 8, ED
BUN: 23 mg/dL — ABNORMAL HIGH (ref 6–20)
CHLORIDE: 104 mmol/L (ref 98–111)
CREATININE: 2 mg/dL — AB (ref 0.61–1.24)
Calcium, Ion: 1.14 mmol/L — ABNORMAL LOW (ref 1.15–1.40)
Glucose, Bld: 125 mg/dL — ABNORMAL HIGH (ref 70–99)
HCT: 47 % (ref 39.0–52.0)
Hemoglobin: 16 g/dL (ref 13.0–17.0)
POTASSIUM: 3.7 mmol/L (ref 3.5–5.1)
Sodium: 141 mmol/L (ref 135–145)
TCO2: 26 mmol/L (ref 22–32)

## 2017-08-28 LAB — APTT: APTT: 31 s (ref 24–36)

## 2017-08-28 LAB — I-STAT TROPONIN, ED: Troponin i, poc: 0.02 ng/mL (ref 0.00–0.08)

## 2017-08-28 LAB — PROTIME-INR
INR: 0.99
Prothrombin Time: 13 seconds (ref 11.4–15.2)

## 2017-08-28 NOTE — ED Triage Notes (Signed)
Pt reports tingling/weakness in L arm and leg since 9AM concerned he had another stroke but decided he wanted to stay in bed.

## 2017-08-29 ENCOUNTER — Inpatient Hospital Stay (HOSPITAL_COMMUNITY): Payer: Medicaid Other

## 2017-08-29 ENCOUNTER — Emergency Department (HOSPITAL_COMMUNITY): Payer: Medicaid Other

## 2017-08-29 ENCOUNTER — Encounter (HOSPITAL_COMMUNITY): Payer: Self-pay | Admitting: Internal Medicine

## 2017-08-29 DIAGNOSIS — N182 Chronic kidney disease, stage 2 (mild): Secondary | ICD-10-CM | POA: Insufficient documentation

## 2017-08-29 DIAGNOSIS — G8194 Hemiplegia, unspecified affecting left nondominant side: Secondary | ICD-10-CM | POA: Diagnosis present

## 2017-08-29 DIAGNOSIS — I129 Hypertensive chronic kidney disease with stage 1 through stage 4 chronic kidney disease, or unspecified chronic kidney disease: Secondary | ICD-10-CM | POA: Diagnosis present

## 2017-08-29 DIAGNOSIS — F17210 Nicotine dependence, cigarettes, uncomplicated: Secondary | ICD-10-CM | POA: Diagnosis present

## 2017-08-29 DIAGNOSIS — N179 Acute kidney failure, unspecified: Secondary | ICD-10-CM | POA: Diagnosis present

## 2017-08-29 DIAGNOSIS — R471 Dysarthria and anarthria: Secondary | ICD-10-CM | POA: Diagnosis present

## 2017-08-29 DIAGNOSIS — Z8349 Family history of other endocrine, nutritional and metabolic diseases: Secondary | ICD-10-CM | POA: Diagnosis not present

## 2017-08-29 DIAGNOSIS — I639 Cerebral infarction, unspecified: Secondary | ICD-10-CM

## 2017-08-29 DIAGNOSIS — I6789 Other cerebrovascular disease: Secondary | ICD-10-CM | POA: Diagnosis present

## 2017-08-29 DIAGNOSIS — R29701 NIHSS score 1: Secondary | ICD-10-CM | POA: Diagnosis present

## 2017-08-29 DIAGNOSIS — E785 Hyperlipidemia, unspecified: Secondary | ICD-10-CM | POA: Diagnosis present

## 2017-08-29 DIAGNOSIS — T465X6A Underdosing of other antihypertensive drugs, initial encounter: Secondary | ICD-10-CM | POA: Diagnosis present

## 2017-08-29 DIAGNOSIS — Z9114 Patient's other noncompliance with medication regimen: Secondary | ICD-10-CM | POA: Diagnosis not present

## 2017-08-29 DIAGNOSIS — T461X6A Underdosing of calcium-channel blockers, initial encounter: Secondary | ICD-10-CM | POA: Diagnosis present

## 2017-08-29 DIAGNOSIS — I6329 Cerebral infarction due to unspecified occlusion or stenosis of other precerebral arteries: Secondary | ICD-10-CM | POA: Diagnosis not present

## 2017-08-29 DIAGNOSIS — R4781 Slurred speech: Secondary | ICD-10-CM | POA: Diagnosis present

## 2017-08-29 DIAGNOSIS — Z8673 Personal history of transient ischemic attack (TIA), and cerebral infarction without residual deficits: Secondary | ICD-10-CM | POA: Diagnosis not present

## 2017-08-29 DIAGNOSIS — I161 Hypertensive emergency: Secondary | ICD-10-CM | POA: Diagnosis present

## 2017-08-29 DIAGNOSIS — Y92009 Unspecified place in unspecified non-institutional (private) residence as the place of occurrence of the external cause: Secondary | ICD-10-CM | POA: Diagnosis not present

## 2017-08-29 DIAGNOSIS — F319 Bipolar disorder, unspecified: Secondary | ICD-10-CM | POA: Diagnosis present

## 2017-08-29 DIAGNOSIS — R001 Bradycardia, unspecified: Secondary | ICD-10-CM | POA: Diagnosis present

## 2017-08-29 DIAGNOSIS — N1832 Chronic kidney disease, stage 3b: Secondary | ICD-10-CM | POA: Insufficient documentation

## 2017-08-29 DIAGNOSIS — T502X6A Underdosing of carbonic-anhydrase inhibitors, benzothiadiazides and other diuretics, initial encounter: Secondary | ICD-10-CM | POA: Diagnosis present

## 2017-08-29 DIAGNOSIS — Z823 Family history of stroke: Secondary | ICD-10-CM | POA: Diagnosis not present

## 2017-08-29 DIAGNOSIS — H538 Other visual disturbances: Secondary | ICD-10-CM | POA: Diagnosis present

## 2017-08-29 DIAGNOSIS — I429 Cardiomyopathy, unspecified: Secondary | ICD-10-CM | POA: Diagnosis present

## 2017-08-29 DIAGNOSIS — Z8249 Family history of ischemic heart disease and other diseases of the circulatory system: Secondary | ICD-10-CM | POA: Diagnosis not present

## 2017-08-29 LAB — COMPREHENSIVE METABOLIC PANEL
ALT: 20 U/L (ref 0–44)
AST: 25 U/L (ref 15–41)
Albumin: 3.9 g/dL (ref 3.5–5.0)
Alkaline Phosphatase: 79 U/L (ref 38–126)
Anion gap: 8 (ref 5–15)
BUN: 20 mg/dL (ref 6–20)
CHLORIDE: 106 mmol/L (ref 98–111)
CO2: 25 mmol/L (ref 22–32)
CREATININE: 1.91 mg/dL — AB (ref 0.61–1.24)
Calcium: 9.2 mg/dL (ref 8.9–10.3)
GFR calc Af Amer: 45 mL/min — ABNORMAL LOW (ref >60)
GFR calc non Af Amer: 39 mL/min — ABNORMAL LOW (ref >60)
GLUCOSE: 124 mg/dL — AB (ref 70–99)
Potassium: 4 mmol/L (ref 3.5–5.1)
SODIUM: 139 mmol/L (ref 135–145)
Total Bilirubin: 0.5 mg/dL (ref 0.3–1.2)
Total Protein: 6.7 g/dL (ref 6.5–8.1)

## 2017-08-29 LAB — DIFFERENTIAL
ABS IMMATURE GRANULOCYTES: 0 10*3/uL (ref 0.0–0.1)
BASOS ABS: 0 10*3/uL (ref 0.0–0.1)
BASOS PCT: 1 %
Eosinophils Absolute: 0.3 10*3/uL (ref 0.0–0.7)
Eosinophils Relative: 6 %
Immature Granulocytes: 0 %
Lymphocytes Relative: 46 %
Lymphs Abs: 2.2 10*3/uL (ref 0.7–4.0)
Monocytes Absolute: 0.4 10*3/uL (ref 0.1–1.0)
Monocytes Relative: 9 %
NEUTROS ABS: 1.8 10*3/uL (ref 1.7–7.7)
NEUTROS PCT: 38 %

## 2017-08-29 LAB — RAPID URINE DRUG SCREEN, HOSP PERFORMED
Amphetamines: NOT DETECTED
BARBITURATES: NOT DETECTED
BENZODIAZEPINES: NOT DETECTED
COCAINE: NOT DETECTED
OPIATES: NOT DETECTED
TETRAHYDROCANNABINOL: POSITIVE — AB

## 2017-08-29 LAB — LIPID PANEL
CHOL/HDL RATIO: 5 ratio
CHOLESTEROL: 180 mg/dL (ref 0–200)
HDL: 36 mg/dL — AB (ref >40)
LDL Cholesterol: 88 mg/dL (ref 0–99)
TRIGLYCERIDES: 281 mg/dL — AB (ref <150)
VLDL: 56 mg/dL — ABNORMAL HIGH (ref 0–40)

## 2017-08-29 LAB — ECHOCARDIOGRAM COMPLETE
Height: 75 in
Weight: 3360 oz

## 2017-08-29 LAB — HEMOGLOBIN A1C
HEMOGLOBIN A1C: 5.1 % (ref 4.8–5.6)
MEAN PLASMA GLUCOSE: 99.67 mg/dL

## 2017-08-29 LAB — CBC
HEMATOCRIT: 47.3 % (ref 39.0–52.0)
HEMOGLOBIN: 15.9 g/dL (ref 13.0–17.0)
MCH: 29.4 pg (ref 26.0–34.0)
MCHC: 33.6 g/dL (ref 30.0–36.0)
MCV: 87.6 fL (ref 78.0–100.0)
Platelets: 191 10*3/uL (ref 150–400)
RBC: 5.4 MIL/uL (ref 4.22–5.81)
RDW: 14.2 % (ref 11.5–15.5)
WBC: 4.7 10*3/uL (ref 4.0–10.5)

## 2017-08-29 MED ORDER — SODIUM CHLORIDE 0.9 % IV SOLN
INTRAVENOUS | Status: AC
  Administered 2017-08-29 (×2): via INTRAVENOUS

## 2017-08-29 MED ORDER — LORAZEPAM 2 MG/ML IJ SOLN
1.0000 mg | Freq: Once | INTRAMUSCULAR | Status: AC
  Administered 2017-08-29: 1 mg via INTRAVENOUS
  Filled 2017-08-29: qty 1

## 2017-08-29 MED ORDER — HYDRALAZINE HCL 50 MG PO TABS
50.0000 mg | ORAL_TABLET | Freq: Three times a day (TID) | ORAL | Status: DC
  Administered 2017-08-29 – 2017-08-30 (×4): 50 mg via ORAL
  Filled 2017-08-29 (×4): qty 1

## 2017-08-29 MED ORDER — STROKE: EARLY STAGES OF RECOVERY BOOK
Freq: Once | Status: AC
  Administered 2017-08-29: 16:00:00
  Filled 2017-08-29: qty 1

## 2017-08-29 MED ORDER — ACETAMINOPHEN 160 MG/5ML PO SOLN: 650.0000 mg | ORAL | Status: DC | PRN

## 2017-08-29 MED ORDER — AMLODIPINE BESYLATE 10 MG PO TABS
10.0000 mg | ORAL_TABLET | Freq: Every day | ORAL | Status: DC
  Administered 2017-08-29 – 2017-08-30 (×2): 10 mg via ORAL
  Filled 2017-08-29 (×2): qty 1

## 2017-08-29 MED ORDER — CLOPIDOGREL BISULFATE 75 MG PO TABS
75.0000 mg | ORAL_TABLET | Freq: Every day | ORAL | Status: DC
  Administered 2017-08-29 – 2017-08-30 (×2): 75 mg via ORAL
  Filled 2017-08-29 (×2): qty 1

## 2017-08-29 MED ORDER — HYDRALAZINE HCL 20 MG/ML IJ SOLN
10.0000 mg | Freq: Four times a day (QID) | INTRAMUSCULAR | Status: DC | PRN
  Administered 2017-08-29: 10 mg via INTRAVENOUS
  Filled 2017-08-29: qty 1

## 2017-08-29 MED ORDER — SENNOSIDES-DOCUSATE SODIUM 8.6-50 MG PO TABS: 1.0000 | ORAL_TABLET | Freq: Every evening | ORAL | Status: DC | PRN

## 2017-08-29 MED ORDER — ASPIRIN EC 81 MG PO TBEC
81.0000 mg | DELAYED_RELEASE_TABLET | Freq: Every day | ORAL | Status: DC
  Administered 2017-08-30: 81 mg via ORAL
  Filled 2017-08-29: qty 1

## 2017-08-29 MED ORDER — HEPARIN SODIUM (PORCINE) 5000 UNIT/ML IJ SOLN
5000.0000 [IU] | Freq: Three times a day (TID) | INTRAMUSCULAR | Status: DC
  Administered 2017-08-29 – 2017-08-30 (×3): 5000 [IU] via SUBCUTANEOUS
  Filled 2017-08-29 (×3): qty 1

## 2017-08-29 MED ORDER — BUPROPION HCL ER (SR) 100 MG PO TB12
100.0000 mg | ORAL_TABLET | Freq: Two times a day (BID) | ORAL | Status: DC
  Administered 2017-08-29 – 2017-08-30 (×3): 100 mg via ORAL
  Filled 2017-08-29 (×4): qty 1

## 2017-08-29 MED ORDER — HYDRALAZINE HCL 50 MG PO TABS
50.0000 mg | ORAL_TABLET | Freq: Two times a day (BID) | ORAL | Status: DC
Start: 2017-08-29 — End: 2017-08-29
  Administered 2017-08-29: 50 mg via ORAL
  Filled 2017-08-29: qty 2

## 2017-08-29 MED ORDER — PALIPERIDONE ER 6 MG PO TB24
6.0000 mg | ORAL_TABLET | Freq: Every day | ORAL | Status: DC
Start: 2017-08-29 — End: 2017-08-30
  Administered 2017-08-29: 6 mg via ORAL
  Filled 2017-08-29: qty 1

## 2017-08-29 MED ORDER — ACETAMINOPHEN 650 MG RE SUPP: 650.0000 mg | RECTAL | Status: DC | PRN

## 2017-08-29 MED ORDER — ACETAMINOPHEN 325 MG PO TABS
650.0000 mg | ORAL_TABLET | ORAL | Status: DC | PRN
  Administered 2017-08-29: 650 mg via ORAL
  Filled 2017-08-29: qty 2

## 2017-08-29 MED ORDER — ATORVASTATIN CALCIUM 40 MG PO TABS
40.0000 mg | ORAL_TABLET | Freq: Every day | ORAL | Status: DC
  Administered 2017-08-29: 40 mg via ORAL
  Filled 2017-08-29: qty 1

## 2017-08-29 MED ORDER — ASPIRIN EC 325 MG PO TBEC
325.0000 mg | DELAYED_RELEASE_TABLET | Freq: Every day | ORAL | Status: DC
  Administered 2017-08-29: 325 mg via ORAL
  Filled 2017-08-29: qty 1

## 2017-08-29 NOTE — Evaluation (Deleted)
Physical Therapy Evaluation Patient Details Name: Thomas [name] MRN: [id number] DOB: 1965-06-28 Today's Date: 08/29/2017   History of Present Illness  Thomas [name] is a 52 y.o. male who presented to the ED complaining of L sided tingling, weakness, and facial drooping. MRI significant for an acute infarct in the pons. PMH of HTN, HLD, CVA in 10/2015, bipolar disorder, tobacco abuse, UTI in 2018, L dental pain in 02/2017.  Clinical Impression  Pt presents with problems above and deficits below. Pt performed bed mobility with Supervision. Pt presents with some decreased fine motor capabilities in L Extremities. Mobility limited by high BP; 180/124 mmHg in supine, 180/134 mmHg sitting at EOB, and 190/123 mmHg upon returning to supine. PT deferred activity at this time due to increase in diastolic BP upon transferring. Will continue to follow acutely to support independence, mobility, and safety.     Follow Up Recommendations Other (comment)(TBD)    Equipment Recommendations  Other (comment)(TBD)    Recommendations for Other Services OT consult     Precautions / Restrictions Precautions Precautions: Fall;Other (comment) Precaution Comments: Permissive hypertension; 220/120 max.  Restrictions Weight Bearing Restrictions: No      Mobility  Bed Mobility Overal bed mobility: Needs Assistance Bed Mobility: Supine to Sit;Sit to Supine     Supine to sit: Supervision Sit to supine: Supervision   General bed mobility comments: Pt performed bed mobility with Supervision, and reported some dizziness with sitting up right. Once in sitting BP elevated to 180/134 mmHg which was outside of permissive HTN range in MD notes, so pt returned to supine with supervision. (Simultaneous filing. User may not have seen previous data.)  Transfers                 General transfer comment: Pt too fatigued to attempt   Ambulation/Gait             General Gait Details: Deferred secondary to  BP  Stairs            Wheelchair Mobility    Modified Rankin (Stroke Patients Only)       Balance Overall balance assessment: Needs assistance Sitting-balance support: Bilateral upper extremity supported;Feet supported Sitting balance-Leahy Scale: Fair Sitting balance - Comments: Pt was able to scoot hips to EOB.       Standing balance comment: Deferred secondary to high BP                             Pertinent Vitals/Pain Pain Assessment: No/denies pain Faces Pain Scale: No hurt    Home Living Family/patient expects to be discharged to:: Private residence Living Arrangements: Spouse/significant other Available Help at Discharge: Family;Available 24 hours/day Type of Home: House Home Access: Stairs to enter Entrance Stairs-Rails: None Entrance Stairs-Number of Steps: 1(Threshold step) Home Layout: One level Home Equipment: None(Pt report he lost previous equipment)      Prior Function Level of Independence: Independent         Comments: Pt reports he is on disability and does not work.  He indicates his is independent with ADLs.  He initially reports his wife drives when asked if he drives.  When asked again for clarification, he states he drives sometimes.  when asked for clarification of how often he states "as often as I can"     Hand Dominance   Dominant Hand: Right    Extremity/Trunk Assessment   Upper Extremity Assessment Upper Extremity Assessment: Overall WFL for  tasks assessed LUE Deficits / Details: Pt able to translate objects palm <> fingertips and rotate objects in his fingertips clockwise and counter clockwise. strenght equal Lt and Rt UE 5/5.  He denies sensory changes in Lt hand and reporst Lt UE back to baseline  LUE Coordination: decreased fine motor    Lower Extremity Assessment Lower Extremity Assessment: Defer to PT evaluation LLE Deficits / Details: Pt was able to perform L SLR supine, but had difficulty performing  alternating toe taps while seated at the EOB. Pt reported no sensory loss or tingling, but not formally tested. LLE Coordination: decreased fine motor    Cervical / Trunk Assessment Cervical / Trunk Assessment: Normal  Communication   Communication: No difficulties  Cognition Arousal/Alertness: Lethargic Behavior During Therapy: Flat affect Overall Cognitive Status: No family/caregiver present to determine baseline cognitive functioning                                 General Comments: Pt very lethargic falling asleep mid sentence at times.   He answers basic questions appropriately, but when asked for details, responses become inconsistent.         General Comments General comments (skin integrity, edema, etc.): BP 194/112    Exercises     Assessment/Plan    PT Assessment Patient needs continued PT services  PT Problem List Decreased activity tolerance;Decreased coordination;Decreased mobility;Decreased balance;Decreased knowledge of use of DME;Decreased strength       PT Treatment Interventions Gait training;DME instruction;Functional mobility training;Therapeutic activities;Therapeutic exercise;Neuromuscular re-education;Patient/family education;Stair training;Balance training    PT Goals (Current goals can be found in the Care Plan section)  Acute Rehab PT Goals Patient Stated Goal: To get back to playing music PT Goal Formulation: With patient Time For Goal Achievement: 09/12/17 Potential to Achieve Goals: Good    Frequency Min 4X/week   Barriers to discharge        Co-evaluation               AM-PAC PT "6 Clicks" Daily Activity  Outcome Measure Difficulty turning over in bed (including adjusting bedclothes, sheets and blankets)?: None Difficulty moving from lying on back to sitting on the side of the bed? : None Difficulty sitting down on and standing up from a chair with arms (e.g., wheelchair, bedside commode, etc,.)?: Unable Help needed  moving to and from a bed to chair (including a wheelchair)?: A Little Help needed walking in hospital room?: A Little Help needed climbing 3-5 steps with a railing? : A Lot 6 Click Score: 17    End of Session   Activity Tolerance: Treatment limited secondary to medical complications (Comment)(Pt limited by high BP) Patient left: in bed;with call bell/phone within reach;with nursing/sitter in room Nurse Communication: Mobility status;Other (comment)(Elevated BP) PT Visit Diagnosis: Other abnormalities of gait and mobility (R26.89);Unsteadiness on feet (R26.81);Muscle weakness (generalized) (M62.81);Dizziness and giddiness (R42);Other symptoms and signs involving the nervous system (R29.898);Hemiplegia and hemiparesis Hemiplegia - Right/Left: Left Hemiplegia - dominant/non-dominant: Non-dominant Hemiplegia - caused by: Cerebral infarction    Time: [id number] PT Time Calculation (min) (ACUTE ONLY): 14 min   Charges:   PT Evaluation $PT Eval Moderate Complexity: 1 Mod        Demetria PoreJulia Manjit Bufano, S-DPT Acute Care Rehab Student [phone]   08/29/2017, 1:57 PM

## 2017-08-29 NOTE — ED Notes (Signed)
Patient transported to Vascular Ultrasound and echo procedure. Monitoring still in progress.

## 2017-08-29 NOTE — Progress Notes (Signed)
*  Preliminary Results* Carotid artery duplex has been completed. Bilateral internal carotid arteries are 1-39%.  Vertebral arteries are patent with antegrade flow.  08/29/2017 11:26 AM  Thomas MilletMegan Clare Gandyiddle

## 2017-08-29 NOTE — Evaluation (Signed)
Occupational Therapy Evaluation Patient Details Name: Thomas LibmanSteven L [name] MRN: [id number] DOB: 09/28/1965 Today's Date: 08/29/2017    History of Present Illness Si GaulSteven [name] is a 52 y.o. male who presented to the ED complaining of L sided tingling, weakness, and facial drooping. MRI significant for an acute infarct in the pons. PMH of HTN, HLD, CVA in 10/2015, bipolar disorder, tobacco abuse, UTI in 2018, L dental pain in 02/2017.   Clinical Impression   Pt admitted with above. He demonstrates the below listed deficits and will benefit from continued OT to maximize safety and independence with BADLs.  Pt very lethargic this pm and pt only able to participate in limited OT eval.   It appears Lt UE is back to baseline and pt confirms this (was able to manipulate items in Lt hand without difficulty).  He reports blurriness with distant vision that he reports is new - he fell asleep mid visual assessment .   He was unable to attempt ADLs or OOB activity.  BP 194/112.   He reports he lives with wife, was independent PTA (provided some inconsistencies when asked for details of PLOF).   Will continue to follow and assess from OT standpoint and make further recommendations.       Follow Up Recommendations  Other (comment)(DIscharge recommendations TBD )    Equipment Recommendations  None recommended by OT    Recommendations for Other Services       Precautions / Restrictions Precautions Precautions: Fall;Other (comment) Precaution Comments: Permissive hypertension; 220/120 max.  Restrictions Weight Bearing Restrictions: No      Mobility Bed Mobility Overal bed mobility: Needs Assistance Bed Mobility: Supine to Sit;Sit to Supine     Supine to sit: Supervision Sit to supine: Supervision   General bed mobility comments: Pt performed bed mobility with Supervision, and reported some dizziness with sitting up right.   Transfers                 General transfer comment: Pt too fatigued  to attempt     Balance Overall balance assessment: Needs assistance Sitting-balance support: Bilateral upper extremity supported;Feet supported Sitting balance-Leahy Scale: Fair Sitting balance - Comments: Pt was able to scoot hips to EOB.       Standing balance comment: Deferred secondary to high BP                           ADL either performed or assessed with clinical judgement   ADL                                         General ADL Comments: Pt too fatigued to engage in ADLs this date.       Vision Baseline Vision/History: Wears glasses Wears Glasses: Reading only Patient Visual Report: Blurring of vision Vision Assessment?: Yes Ocular Range of Motion: Within Functional Limits Alignment/Gaze Preference: Within Defined Limits Tracking/Visual Pursuits: Able to track stimulus in all quads without difficulty Convergence: Other (comment)(Pt fell asleep during testing ) Visual Fields: No apparent deficits Additional Comments: Pt reports blurriness and shadowing of objects with distant vision.  He reports this is different from his baseline.  He was able to read clock on wall      Perception     Praxis      Pertinent Vitals/Pain Pain Assessment: No/denies pain Faces Pain Scale: No hurt  Hand Dominance Right   Extremity/Trunk Assessment Upper Extremity Assessment Upper Extremity Assessment: Overall WFL for tasks assessed LUE Deficits / Details: Pt able to translate objects palm <> fingertips and rotate objects in his fingertips clockwise and counter clockwise. strenght equal Lt and Rt UE 5/5.  He denies sensory changes in Lt hand and reporst Lt UE back to baseline  LUE Coordination: decreased fine motor   Lower Extremity Assessment Lower Extremity Assessment: Defer to PT evaluation LLE Deficits / Details: Pt was able to perform L SLR supine, but had difficulty performing alternating toe taps while seated at the EOB. Pt reported no  sensory loss or tingling, but not formally tested. LLE Coordination: decreased fine motor   Cervical / Trunk Assessment Cervical / Trunk Assessment: Normal   Communication Communication Communication: No difficulties   Cognition Arousal/Alertness: Lethargic Behavior During Therapy: Flat affect Overall Cognitive Status: No family/caregiver present to determine baseline cognitive functioning                                 General Comments: Pt very lethargic falling asleep mid sentence at times.   He answers basic questions appropriately, but when asked for details, responses become inconsistent.      General Comments  BP 194/112    Exercises     Shoulder Instructions      Home Living Family/patient expects to be discharged to:: Private residence Living Arrangements: Spouse/significant other Available Help at Discharge: Family;Available 24 hours/day Type of Home: House Home Access: Stairs to enter [employer] of Steps: 1(Threshold step) Entrance Stairs-Rails: None Home Layout: One level     Bathroom Shower/Tub: Tub only(Pt plans on taking baths)   Bathroom Toilet: Standard Bathroom Accessibility: Yes   Home Equipment: None(Pt report he lost previous equipment)          Prior Functioning/Environment Level of Independence: Independent        Comments: Pt reports he is on disability and does not work.  He indicates his is independent with ADLs.  He initially reports his wife drives when asked if he drives.  When asked again for clarification, he states he drives sometimes.  when asked for clarification of how often he states "as often as I can"        OT Problem List: Decreased activity tolerance;Impaired balance (sitting and/or standing);Impaired vision/perception;Decreased cognition;Decreased safety awareness      OT Treatment/Interventions: Self-care/ADL training;DME and/or AE instruction;Neuromuscular education;Therapeutic  activities;Cognitive remediation/compensation;Visual/perceptual remediation/compensation;Patient/family education;Balance training    OT Goals(Current goals can be found in the care plan section) Acute Rehab OT Goals Patient Stated Goal: To get back to playing music OT Goal Formulation: With patient Time For Goal Achievement: 09/05/17 Potential to Achieve Goals: Good ADL Goals Pt Will Perform Grooming: with modified independence;standing Pt Will Perform Upper Body Bathing: with modified independence;sitting;standing Pt Will Perform Lower Body Bathing: with modified independence;sit to/from stand Pt Will Perform Upper Body Dressing: with modified independence;sitting Pt Will Perform Lower Body Dressing: with modified independence;sit to/from stand Pt Will Transfer to Toilet: with modified independence;ambulating;regular height toilet;bedside commode;grab bars Pt Will Perform Toileting - Clothing Manipulation and hygiene: with modified independence;sit to/from stand Pt Will Perform Tub/Shower Transfer: Tub transfer;with supervision;ambulating Additional ADL Goal #1: Pt will participate in further visual and cognitive assessment  OT Frequency: Min 2X/week   Barriers to D/C:            Co-evaluation  AM-PAC PT "6 Clicks" Daily Activity     Outcome Measure Help from another person eating meals?: None Help from another person taking care of personal grooming?: A Little Help from another person toileting, which includes using toliet, bedpan, or urinal?: A Little Help from another person bathing (including washing, rinsing, drying)?: A Little Help from another person to put on and taking off regular upper body clothing?: A Little Help from another person to put on and taking off regular lower body clothing?: A Little 6 Click Score: 19   End of Session    Activity Tolerance: Patient limited by lethargy Patient left: in bed;with call bell/phone within reach  OT Visit  Diagnosis: Unsteadiness on feet (R26.81)                Time: 1610-9604 OT Time Calculation (min): 11 min Charges:  OT General Charges $OT Visit: 1 Visit OT Evaluation $OT Eval Moderate Complexity: 1 Mod  Vergia Chea, OTR/L [phone]   Caidan Hubbert M 08/29/2017, 1:50 PM

## 2017-08-29 NOTE — Consult Note (Addendum)
Referring Physician: Georgette Shell, MD    Chief Complaint: Tingling  HPI: Thomas [name] is an 52 y.o. 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  Patient he was in his usual state of health when on the morning of 08/28/2017 he woke up with left sided paresthesia and hemiparesis with associated symptoms of left elbow and left knee pain, transient blurred vision and unsteady gait.  Patient states he could not adequately grip items and even dropped the phone from his left hand.  He did not say anything to his wife as he wanted to see if symptoms got better, and around 10 PM last night, he informed his wife who immediately brought him to the ER for evaluation.  Per reports wife stated that patient initially had dysarthric speech which resolved.  Patient denies associated headache, dizziness, confusion, syncope, chest pain, shortness of breath, diaphoresis, nausea, vomiting or recent illnesses. Per wife patient's blood pressure has been labile and patient admits to noncompliance with taking his BP medications ( states he tries to take it with food and sometimes he forgets)  On admission to the ER, patient was awake alert oriented x3 in no acute distress, CT of the head  revealed  no acute intracranial abnormality chronic stable bilateral small cerebral infarcts and probable right basal ganglia in lacunar infarcts.  MRI showed small acute infarct of the right dorsal pons without hemorrhage or mass effect.  MRA of the head showed no large or medium vessel occlusion or correctable proximal stenosis as well as atherosclerotic narrowing and irregularity of the more distal  intracranial branch vessels of MCA and ACA.  Neuro was consulted for further evaluation on assessment patient denies left paresthesia yes, states he last felt numbness and tingling about 30 minutes ago and symptoms have been waxing and waning, admits to improvement in strength in the left upper and lower extremities since admission.  LSN: 08/27/17 at bedtime tPA Given: No: Out of the time frame window NIHSS:  1 On admission   Baseline Premorbid modified Rankin scale (mRS): 0   Past Medical History:  Diagnosis Date  . Abnormal EKG   . Bipolar disorder ([place])   . Cardiomegaly   . CKD (chronic kidney disease), stage II   . Family history of heart disease   . H/O medication noncompliance   . Hyperlipidemia   . Hypertension   . Stroke ([place]) 11/07/2015  . Tobacco abuse     Past Surgical History:  Procedure Laterality Date  . ANKLE FRACTURE SURGERY    . NM MYOCAR PERF WALL MOTION  06/28/2011   protocol [place], normal perfusion nin all regions, post stress EF 57%,, exercise cap 13METS  . TRANSTHORACIC ECHOCARDIOGRAM  06/28/2011   EF=55%, Proximal septal thickening, borderline LA enlargement, boarderline aortic root dialation    Family History  Problem Relation Age of Onset  . Hypertension Mother   . Diabetes Mother   . Stroke Mother   . Hypertension Father   . Diabetes Father   . Stroke Father   . Hypertension Sister   . Stroke Sister   . Diabetes Sister   . Hyperlipidemia Maternal Grandmother   . Diabetes Maternal Grandmother   .  Asthma Daughter   . Asthma Son    Social History:  reports that he quit smoking about 21 months ago. His smoking use included cigarettes. He has a 3.75 pack-year smoking history. He has never used smokeless tobacco. He reports that he drinks about 12.0 standard drinks of alcohol per week. He reports that he does not use drugs.  Allergies: No Known Allergies  Medications:  .  stroke: mapping our early stages of recovery book   Does not apply Once   . aspirin EC  325 mg Oral Daily  . atorvastatin  40 mg Oral q1800  . buPROPion  100 mg Oral BID WC  . heparin  5,000 Units Subcutaneous Q8H  . hydrALAZINE  50 mg Oral BID  . paliperidone  6 mg Oral QHS    ROS: General: negative for - chills, fatigue, fever, night sweats, weight gain or weight loss Psychological  negative for - behavioral disorder, Ophthalmic : admits to transient blurry vision yesterday am, denies double vision, eye pain or loss of vision ENT: negative for - epistaxis, nasal discharge, vertigo, tinnitus Respiratory: negative for - cough, hemoptysis, shortness of breath or wheezing Cardiovascular: negative for - chest pain, dyspnea on exertion, edema or irregular heartbeat Gastrointestinal: negative for - abdominal pain, diarrhea, hematemesis, nausea/vomiting or stool incontinence Genito-Urinary: negative for - dysuria, hematuria, incontinence or urinary frequency/urgency Musculoskeletal: negative for - joint swelling or muscular weakness Neurological: as noted in HPI Dermatological: negative for rash and skin lesion changes    Physical Examination: Blood pressure (!) 173/117, pulse (!) 50, temperature 98.1 F (36.7 C), temperature source Oral, resp. rate 12, height 6' 3"  (1.905 m), weight 95.3 kg, SpO2 98 %. HEENT-  Normocephalic, no lesions, without obvious abnormality.  Normal external eye and conjunctiva.   Cardiovascular- S1-S2 audible, pulses palpable throughout   Lungs-no rhonchi or wheezing noted, no excessive working breathing.  Saturations within normal limits Abdomen- All 4 quadrants palpated and nontender Musculoskeletal-no joint tenderness, deformity or swelling Skin-warm and dry,   Neurological Examination Mental Status: Alert, oriented, thought content appropriate.  Speech fluent without evidence of aphasia.  Able to follow 3 step commands without difficulty. Cranial Nerves: II: Visual fields grossly normal,  III,IV, VI: ptosis not present,  extra-ocular motions intact bilaterally pupils equal, round, reactive to light  V,VII: smile symmetric, facial light touch sensation normal bilaterally VIII: Hearing intact to voice IX,X: uvula rises symmetrically XI: bilateral shoulder shrug XII: midline tongue extension Motor: She does move all extremities equally with equal strength with hip flexion, knee flexion, bicep flexion, slightly weaker in the left hand grip and left foot flexion Right : Upper extremity   5/5    Left:     Upper extremity   4.5/5  Lower extremity   5/5     Lower extremity   4.5/5 Tone and bulk:normal tone throughout; no atrophy noted Sensory: Pinprick and light touch sensation and temperature intact bilaterally Deep Tendon Reflexes: 2+ and symmetric throughout Plantars: Right: downgoing   Left: downgoing Cerebellar: normal finger-to-nose, mildly ataxic heel-to-shin test Gait: Not tested   Results for orders placed or performed during the hospital encounter of 08/28/17 (from the past 48 hour(s))  Protime-INR     Status: None   Collection Time: 08/28/17 11:17 PM  Result Value Ref Range   Prothrombin Time 13.0 11.4 - 15.2 seconds   INR 0.99     Comment: Performed at [facility], [place] [address], [place], [place] [address]  APTT  Status: None   Collection Time: 08/28/17 11:17 PM  Result Value Ref Range   aPTT 31 24 - 36 seconds    Comment: Performed at [facility], [place] [address] [place] [address]  CBC     Status: None   Collection Time: 08/28/17 11:17 PM  Result Value Ref Range   WBC 4.7 4.0 - 10.5 K/uL   RBC 5.40 4.22 - 5.81 MIL/uL   Hemoglobin 15.9 13.0 - 17.0 g/dL   HCT 47.3 39.0 - 52.0 %   MCV 87.6 78.0 - 100.0 fL   MCH 29.4 26.0 - 34.0 pg   MCHC 33.6 30.0 - 36.0 g/dL   RDW 14.2 11.5 - 15.5 %   Platelets 191 150 - 400 K/uL    Comment: Performed at [facility], [place] [address] [place], [place] [address]  Differential     Status: None   Collection Time: 08/28/17  11:17 PM  Result Value Ref Range   Neutrophils Relative % 38 %   Neutro Abs 1.8 1.7 - 7.7 K/uL   Lymphocytes Relative 46 %   Lymphs Abs 2.2 0.7 - 4.0 K/uL   Monocytes Relative 9 %   Monocytes Absolute 0.4 0.1 - 1.0 K/uL   Eosinophils Relative 6 %   Eosinophils Absolute 0.3 0.0 - 0.7 K/uL   Basophils Relative 1 %   Basophils Absolute 0.0 0.0 - 0.1 K/uL   Immature Granulocytes 0 %   Abs Immature Granulocytes 0.0 0.0 - 0.1 K/uL    Comment: Performed at [facility] [address], [place], [place] [address]  Comprehensive metabolic panel     Status: Abnormal   Collection Time: 08/28/17 11:17 PM  Result Value Ref Range   Sodium 139 135 - 145 mmol/L   Potassium 4.0 3.5 - 5.1 mmol/L   Chloride 106 98 - 111 mmol/L   CO2 25 22 - 32 mmol/L   Glucose, Bld 124 (H) 70 - 99 mg/dL   BUN 20 6 - 20 mg/dL   Creatinine, Ser 1.91 (H) 0.61 - 1.24 mg/dL   Calcium 9.2 8.9 - 10.3 mg/dL   Total Protein 6.7 6.5 - 8.1 g/dL   Albumin 3.9 3.5 - 5.0 g/dL   AST 25 15 - 41 U/L   ALT 20 0 - 44 U/L   Alkaline Phosphatase 79 38 - 126 U/L   Total Bilirubin 0.5 0.3 - 1.2 mg/dL   GFR calc non Af Amer 39 (L) >60 mL/min   GFR calc Af Amer 45 (L) >60 mL/min    Comment: (NOTE) The eGFR has been calculated using the CKD EPI equation. This calculation has not been validated in all clinical situations. eGFR's persistently <60 mL/min signify possible Chronic Kidney Disease.    Anion gap 8 5 - 15    Comment: Performed at [facility] [address] [place], [place] [address]  Hemoglobin A1c     Status: None   Collection Time: 08/28/17 11:17 PM  Result Value Ref Range   Hgb A1c MFr Bld 5.1 4.8 - 5.6 %    Comment: (NOTE) Pre diabetes:          5.7%-6.4% Diabetes:              >6.4% Glycemic control for   <7.0% adults with diabetes    Mean Plasma Glucose 99.67 mg/dL    Comment: Performed at [facility] [address] [place],  [address]  Lipid  panel     Status: Abnormal    Collection Time: 08/28/17 11:17 PM  Result Value Ref Range   Cholesterol 180 0 - 200 mg/dL   Triglycerides 281 (H) <150 mg/dL   HDL 36 (L) >40 mg/dL   Total CHOL/HDL Ratio 5.0 RATIO   VLDL 56 (H) 0 - 40 mg/dL   LDL Cholesterol 88 0 - 99 mg/dL    Comment:        Total Cholesterol/HDL:CHD Risk Coronary Heart Disease Risk Table                     Men   Women  1/2 Average Risk   3.4   3.3  Average Risk       5.0   4.4  2 X Average Risk   9.6   7.1  3 X Average Risk  23.4   11.0        Use the calculated Patient Ratio above and the CHD Risk Table to determine the patient's CHD Risk.        ATP III CLASSIFICATION (LDL):  <100     mg/dL   Optimal  100-129  mg/dL   Near or Above                    Optimal  130-159  mg/dL   Borderline  160-189  mg/dL   High  >190     mg/dL   Very High Performed at [facility] [address], [place], [place] [address]   I-stat troponin, ED     Status: None   Collection Time: 08/28/17 11:26 PM  Result Value Ref Range   Troponin i, poc 0.02 0.00 - 0.08 ng/mL   Comment 3            Comment: Due to the release kinetics of cTnI, a negative result within the first hours of the onset of symptoms does not rule out myocardial infarction with certainty. If myocardial infarction is still suspected, repeat the test at appropriate intervals.   I-Stat Chem 8, ED     Status: Abnormal   Collection Time: 08/28/17 11:28 PM  Result Value Ref Range   Sodium 141 135 - 145 mmol/L   Potassium 3.7 3.5 - 5.1 mmol/L   Chloride 104 98 - 111 mmol/L   BUN 23 (H) 6 - 20 mg/dL   Creatinine, Ser 2.00 (H) 0.61 - 1.24 mg/dL   Glucose, Bld 125 (H) 70 - 99 mg/dL   Calcium, Ion 1.14 (L) 1.15 - 1.40 mmol/L   TCO2 26 22 - 32 mmol/L   Hemoglobin 16.0 13.0 - 17.0 g/dL   HCT 47.0 39.0 - 52.0 %   Ct Head Wo Contrast 08/28/2017 IMPRESSION: No acute intracranial abnormality. Chronic stable bilateral small cerebellar infarcts and probable right basal ganglial lacunar  infarcts.   Mr Brain Wo Contrast 08/29/2017 IMPRESSION: 1. Small acute infarct of the right dorsal pons without hemorrhage or mass effect.  2. Multiple old basal ganglia and cerebellar infarcts.  Mr Jodene Nam Head Wo Contrast  08/29/2017 IMPRESSION: No large or medium vessel occlusion or correctable proximal stenosis. Atherosclerotic narrowing and irregularity of the more distal intracranial branch vessels as seen previously.   Dg Chest Port 1 View 08/29/2017 IMPRESSION:  1. Aorta appears more prominent than on prior examination. This may be related to differences in technique and degree of inspiration. This can be assessed on follow-up two-view chest with better inspiration.  2. Cardiomegaly.  3.  No infiltrate or congestive heart failure.   Assessment: 52 y.o. 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  Per patient symptoms have been waxing and waning and on assessment  He denies left sided paresthesia and admits to improvement of left sided weakness and mobility.  Noncon CT of the head showed chronic stable bilateral small cerebral infarcts without any new acute intracranial abnormality, MRI of the brain showed a small acute infarct of the right dorsal pons and MR a of the head did not show a large or medium vessel occlusion or correctable proximal stenosis.   1.  Small acute infarct of the right dorsal pons likely secondary to small vessel disease and patient with history of uncontrolled hypertension with medication noncompliance and tobacco abuse.  Patient has a previous history of right cerebellar infarct and old small brain infarct in October 2017 with right-sided  weakness which has since resolved.  We will pursue full stroke work-up with echocardiogram and carotid Dopplers.  Risk stratification labs shows LDL 88 hemoglobin A1c 5.1.  Patient was previously on aspirin 81 mg but will initiate aspirin 325 mg as well as high-dose atorvastatin.  Patient certainly has risk factors as well as family history of stroke and extensively discussed modifiable risk factors such as med compliance and tobacco cessation with patient who is very agreeable.  Continue close neuro and telemetry monitoring as patient's symptoms are waxing and waning.  Stroke Risk Factors - family history- Mother with Hx of Stroke, hyperlipidemia, uncontrolled hypertension and smoking  2.  Hypertensive emergency -Patient's blood pressure currently  187/133, in the setting of acute infarct, will allow for permissive hypertensive over the next 24 hours with as needed hydralazine for SBP> 220/120.  Defer labetalol at this time secondary to patient's bradycardia heart rate in the mid 40s.  Hold all patients PO BP meds for now  3.  Dyslipidemia 4.  Tobacco abuse-tobacco cessation discussed with patient 5.  Bipolar disorder    Plan: - PT consult, OT consult, Speech consult - Echocardiogram pending - Carotid dopplers pending - Prophylactic therapy-Antiplatelet med: Aspirin - dose 315m - High dose Statin 876m- Allow for permissive hypertension for the first 24-48h - only treat PRN if SBP >220/120 mmHg. Blood pressures can be gradually normalized to SBP<140 upon discharge.  Treat with Hydralazine prn, Pt's HR currently mid 40s - Risk factor modification and medication compliance discussed extensively with patient - Tobacco cessation discussed with patient who states he will quit - Telemetry monitoring -  Frequent neuro checks   PeLetha CapeNP Neuro-hospitalist Team [phone]/08/2017, 9:30 AM   +

## 2017-08-29 NOTE — ED Notes (Signed)
Report attempted x 1

## 2017-08-29 NOTE — H&P (Signed)
History and Physical    Thomas LibmanSteven L [name] RUE:[id number] DOB: 12/27/1965 DOA: 08/28/2017  PCP: Fleet ContrasAvbuere, Edwin, MD Patient coming from: home  Chief Complaint: left sided tingling/weakness  HPI: Thomas LibmanSteven L [name] is a 52 y.o. male with medical history significant stroke, hypertension, hyperlipidemia, tobacco use, bipolar disorder presents to the emergency Department chief complaint of left-sided numbness tingling and weakness. Initial evaluation includes an MR of the brain confirming acute stroke. [facility] are asked to admit  Information is obtained from the patient and his wife who is at the bedside as well as the chart. He noted some tingling in his left arm and leg yesterday when he awakened around 10 AM. Wife reports he did not say anything about it until 3 AM this morning. Associated symptoms include pain in the left elbow and left knee as well as blurred vision and generalized weakness on the left side. Wife states she noted and unsteady gait. Patient reports he was on the telephone and dropped it without realizing that he could not grip the phone. He denies difficulty chewing or swallowing. Wife states that initially his speech was slurred but that has cleared up. He denies headache dizziness syncope or near-syncope. He denies chest pain palpitation shortness of breath diaphoresis nausea or vomiting. He denies any fever chills cough lower extremity edema. He denies dysuria hematuria frequency or urgency. He denies any diarrhea constipation melena bright red blood per rectum. He notes he did not come to the hospital sooner even though he's had strokes before because he just  Wanted to "wait and see if it got better". Wife reports inconsistency with his blood pressure medication.   ED Course: in the emergency department he's afebrile hypertensive with a heart rate the low end of normal. He is not hypoxic.  Review of Systems: As per HPI otherwise all other systems reviewed and are negative.     Ambulatory Status: ambulates independently is independent with ADLs  Past Medical History:  Diagnosis Date  . Abnormal EKG   . Bipolar disorder (HCC)   . Cardiomegaly   . CKD (chronic kidney disease), stage II   . Family history of heart disease   . H/O medication noncompliance   . Hyperlipidemia   . Hypertension   . Stroke (HCC) 11/07/2015  . Tobacco abuse     Past Surgical History:  Procedure Laterality Date  . ANKLE FRACTURE SURGERY    . NM MYOCAR PERF WALL MOTION  06/28/2011   protocol Bruce, normal perfusion nin all regions, post stress EF 57%,, exercise cap 13METS  . TRANSTHORACIC ECHOCARDIOGRAM  06/28/2011   EF=55%, Proximal septal thickening, borderline LA enlargement, boarderline aortic root dialation    Social History   Socioeconomic History  . Marital status: Married    Spouse name: Not on file  . Number of children: Not on file  . Years of education: Not on file  . Highest education level: Not on file  Occupational History  . Not on file  Social Needs  . Financial resource strain: Not on file  . Food insecurity:    Worry: Not on file    Inability: Not on file  . Transportation needs:    Medical: Not on file    Non-medical: Not on file  Tobacco Use  . Smoking status: Former Smoker    Packs/day: 0.25    Years: 15.00    Pack years: 3.75    Types: Cigarettes    Last attempt to quit: 11/06/2015    Years  since quitting: 1.8  . Smokeless tobacco: Never Used  . Tobacco comment: quit in Juy 2015  Substance and Sexual Activity  . Alcohol use: Yes    Alcohol/week: 12.0 standard drinks    Types: 12 Cans of beer per week    Comment: 12 pack on weekends   . Drug use: No  . Sexual activity: Not on file  Lifestyle  . Physical activity:    Days per week: Not on file    Minutes per session: Not on file  . Stress: Not on file  Relationships  . Social connections:    Talks on phone: Not on file    Gets together: Not on file    Attends religious  service: Not on file    Active member of club or organization: Not on file    Attends meetings of clubs or organizations: Not on file    Relationship status: Not on file  . Intimate partner violence:    Fear of current or ex partner: Not on file    Emotionally abused: Not on file    Physically abused: Not on file    Forced sexual activity: Not on file  Other Topics Concern  . Not on file  Social History Narrative  . Not on file    No Known Allergies  Family History  Problem Relation Age of Onset  . Hypertension Mother   . Diabetes Mother   . Stroke Mother   . Hypertension Father   . Diabetes Father   . Stroke Father   . Hypertension Sister   . Stroke Sister   . Diabetes Sister   . Hyperlipidemia Maternal Grandmother   . Diabetes Maternal Grandmother   . Asthma Daughter   . Asthma Son     Prior to Admission medications   Medication Sig Start Date End Date Taking? Authorizing Provider  amLODipine (NORVASC) 10 MG tablet Take 1 tablet (10 mg total) by mouth daily. 06/07/16  Yes Pricilla Loveless, MD  aspirin EC 325 MG tablet Take 325 mg by mouth daily.   Yes [provider]  buPROPion (WELLBUTRIN SR) 100 MG 12 hr tablet Take 100 mg by mouth 2 (two) times daily.   Yes [provider]  hydrALAZINE (APRESOLINE) 50 MG tablet Take 1 tablet (50 mg total) by mouth 2 (two) times daily. 06/07/16  Yes Pricilla Loveless, MD  hydrochlorothiazide (HYDRODIURIL) 25 MG tablet Take 1 tablet (25 mg total) by mouth daily. 06/07/16  Yes Pricilla Loveless, MD  paliperidone (INVEGA) 6 MG 24 hr tablet Take 6 mg by mouth at bedtime.   Yes [provider]    Physical Exam: Vitals:   08/29/17 0552 08/29/17 0557 08/29/17 0600 08/29/17 0615  BP: (!) 182/103  (!) 184/103 (!) 170/119  Pulse:  (!) 45 (!) 56 (!) 44  Resp: 12 12 14 12   Temp:      TempSrc:      SpO2:  98% 97% 97%  Weight:      Height:         General:  Appears calm and comfortable in no acute distress Eyes:   PERRL, EOMI, normal lids, iris ENT:  grossly normal hearing, lips & tongue, mmm Neck:  no LAD, masses or thyromegaly Cardiovascular:  Bradycardia but regular, no m/r/g. No LE edema.  Respiratory:  CTA bilaterally, no w/r/r. Normal respiratory effort. Abdomen:  soft, ntnd, positive bowel sounds throughout no guarding or rebounding Skin:  no rash or induration seen on limited exam Musculoskeletal:  grossly normal tone BUE/BLE, good ROM, no bony abnormality Psychiatric:  grossly normal mood and affect, speech fluent and appropriate, AOx3 Neurologic:  Alert and oriented 3 speech slow but clear facial symmetry left grip 4 out of 5 right grip 5 out of 5 left lower extremity strength 4 out of 5 right lower extremity strength 5 out of 5 no pronator drift tongue midline  Labs on Admission: I have personally reviewed following labs and imaging studies  CBC: Recent Labs  Lab 08/28/17 2317 08/28/17 2328  WBC 4.7  --   NEUTROABS 1.8  --   HGB 15.9 16.0  HCT 47.3 47.0  MCV 87.6  --   PLT 191  --    Basic Metabolic Panel: Recent Labs  Lab 08/28/17 2317 08/28/17 2328  NA 139 141  K 4.0 3.7  CL 106 104  CO2 25  --   GLUCOSE 124* 125*  BUN 20 23*  CREATININE 1.91* 2.00*  CALCIUM 9.2  --    GFR: Estimated Creatinine Clearance: 52.2 mL/min (A) (by C-G formula based on SCr of 2 mg/dL (H)). Liver Function Tests: Recent Labs  Lab 08/28/17 2317  AST 25  ALT 20  ALKPHOS 79  BILITOT 0.5  PROT 6.7  ALBUMIN 3.9   No results for input(s): LIPASE, AMYLASE in the last 168 hours. No results for input(s): AMMONIA in the last 168 hours. Coagulation Profile: Recent Labs  Lab 08/28/17 2317  INR 0.99   Cardiac Enzymes: No results for input(s): CKTOTAL, CKMB, CKMBINDEX, TROPONINI in the last 168 hours. BNP (last 3 results) No results for input(s): PROBNP in the last 8760 hours. HbA1C: No results for input(s): HGBA1C in the last 72 hours. CBG: No results for input(s): GLUCAP in the last  168 hours. Lipid Profile: No results for input(s): CHOL, HDL, LDLCALC, TRIG, CHOLHDL, LDLDIRECT in the last 72 hours. Thyroid Function Tests: No results for input(s): TSH, T4TOTAL, FREET4, T3FREE, THYROIDAB in the last 72 hours. Anemia Panel: No results for input(s): VITAMINB12, FOLATE, FERRITIN, TIBC, IRON, RETICCTPCT in the last 72 hours. Urine analysis:    Component Value Date/Time   COLORURINE YELLOW 06/07/2016 1723   APPEARANCEUR CLEAR 06/07/2016 1723   LABSPEC 1.010 06/07/2016 1723   PHURINE 7.0 06/07/2016 1723   GLUCOSEU NEGATIVE 06/07/2016 1723   HGBUR NEGATIVE 06/07/2016 1723   HGBUR negative 09/28/2009 0923   BILIRUBINUR NEGATIVE 06/07/2016 1723   KETONESUR NEGATIVE 06/07/2016 1723   PROTEINUR NEGATIVE 06/07/2016 1723   UROBILINOGEN 2.0 09/28/2009 0923   NITRITE NEGATIVE 06/07/2016 1723   LEUKOCYTESUR NEGATIVE 06/07/2016 1723    Creatinine Clearance: Estimated Creatinine Clearance: 52.2 mL/min (A) (by C-G formula based on SCr of 2 mg/dL (H)).  Sepsis Labs: @LABRCNTIP (procalcitonin:4,lacticidven:4) )No results found for this or any previous visit (from the past 240 hour(s)).   Radiological Exams on Admission: Ct Head Wo Contrast  Result Date: 08/28/2017 CLINICAL DATA:  Tingling and weakness in the left arm and leg since 9 a.m. EXAM: CT HEAD WITHOUT CONTRAST TECHNIQUE: Contiguous axial images were obtained from the base of the skull through the vertex without intravenous contrast. COMPARISON:  None. FINDINGS: Brain: Chronic small bilateral cerebellar infarcts. Stable tiny right basal ganglial hypodensities that may reflect small lacunar infarcts. Gray-white matter distinction is otherwise maintained with minimal small vessel ischemic disease of periventricular white matter. No hydrocephalus. Patent midline fourth ventricle and basal cisterns. No intra-axial mass nor extra-axial fluid collection. Vascular: Mild atherosclerosis of the carotid siphons. Skull: Negative for  fracture or suspicious  osseous lesions. Sinuses/Orbits: Intact Other: Clear bilateral mastoids. IMPRESSION: No acute intracranial abnormality. Chronic stable bilateral small cerebellar infarcts and probable right basal ganglial lacunar infarcts. Electronically Signed   By: Tollie Eth M.D.   On: 08/28/2017 23:47   Mr Brain Wo Contrast  Result Date: 08/29/2017 CLINICAL DATA:  Left arm tingling and weakness EXAM: MRI HEAD WITHOUT CONTRAST TECHNIQUE: Multiplanar, multiecho pulse sequences of the brain and surrounding structures were obtained without intravenous contrast. COMPARISON:  Head CT 08/28/2017 FINDINGS: BRAIN: Small, 6 mm focus of abnormal diffusion restriction within the dorsal aspect of the right pons. No other diffusion restriction abnormality. The midline structures are normal. Old bilateral cerebellar and left pontine infarcts, in addition to multiple old basal ganglia infarcts. Multifocal white matter hyperintensity, most commonly due to chronic ischemic microangiopathy. the CSF spaces are normal for age, with no hydrocephalus. Susceptibility-sensitive sequences show no chronic microhemorrhage or superficial siderosis. VASCULAR: Major intracranial arterial and venous sinus flow voids are preserved. SKULL AND UPPER CERVICAL SPINE: The visualized skull base, calvarium, upper cervical spine and extracranial soft tissues are normal. SINUSES/ORBITS: No fluid levels or advanced mucosal thickening. No mastoid or middle ear effusion. The orbits are normal. IMPRESSION: 1. Small acute infarct of the right dorsal pons without hemorrhage or mass effect. 2. Multiple old basal ganglia and cerebellar infarcts. Electronically Signed   By: Deatra Robinson M.D.   On: 08/29/2017 05:29    EKG: Independently reviewed. Sinus bradycardia Borderline prolonged PR interval Probable left atrial enlargement IVCD, consider atypical RBBB Left ventricular hypertrophy Nonspecific T abnormalities, lateral leads When compared with  ECG of 08/28/2017,  Assessment/Plan Principal Problem:   Acute cerebrovascular accident (CVA) (HCC) Active Problems:   HYPERTENSION, BENIGN ESSENTIAL   Acute kidney injury superimposed on CKD (HCC)   TOBACCO ABUSE   Hyperlipidemia   Bipolar disorder (HCC)   #1. Acute CVA. Patient presents with left-sided numbness tingling weakness and unsteady gait. Risk factors include uncontrolled blood pressure hyperlipidemia tobacco use family medical history. CT of the head reveals no acute intracranial abnormality chronic stable bilateral small cerebral infarcts and probable right basal ganglia in lacunar infarcts.  MRI showed small acute infarct of the right dorsal pons without hemorrhage or mass effect. Patient reports symptoms "come and go" at the time of admission. He passed his bedside swallow eval -admit to telemetry -frequent neuro checks -MRA -Carotid Dopplers, 2-D echo -Lipid panel hemoglobin A1c -PT/OT -Aspirin and statin -chest x-ray -Await further recommendations from neuro  #2. Hypertension. Uncontrolled. Wife reports noncompliance with his blood pressure medicines. Home medicines include Norvasc, hydralazine, hydrochlorothiazide. Chart review indicates he also previously on lisinopril -Hold Norvasc and hydrochlorothiazide for now -Continue a Apresoline with parameters allowing for permissive hypertension -Monitor  #3. Hyperlipidemia -Lipid panel -Statin  #4. 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 Creatinine 2.0 on admission. Baseline appears to be 1.4 -Gentle IV fluids -Hold nephrotoxins -Monitor urine output -Recheck in the morning  #4. Bipolar disorder. Patient on disability related to same. Appears stable at baseline. Home meds include paliperidone. -continue home meds  #5. Tobacco use -cessation counseling offered   DVT prophylaxis:  heparin Code Status: full  Family Communication: wife at bedside  Disposition Plan: home  Consults  called: neuro per ED provider  Admission status: inpatient    Gwenyth Bender NP [facility]  If 7PM-7AM, please contact night-coverage [email or web address] Password [email or web address]  08/29/2017, 7:12 AM

## 2017-08-29 NOTE — ED Notes (Signed)
Diet heart healthy- lunch tray ordered

## 2017-08-29 NOTE — ED Notes (Signed)
OT at bedside. 

## 2017-08-29 NOTE — Progress Notes (Signed)
  Echocardiogram 2D Echocardiogram has been performed.  Roosvelt MaserLane, Ronique Simerly F 08/29/2017, 11:54 AM

## 2017-08-29 NOTE — Care Management (Signed)
This is a no charge note  Pending admission per Dr. Preston FleetingGlick  52 year old male with a past medical history of stroke, hypertension, hyperlipidemia, depression, tobacco abuse, who presents with left sided weakness and numbness 24 hours ago, MRI showed small acute infarct of the right dorsal pons without hemorrhage or mass effect. Pt is place on tele bed for obs. EDP is calling neurology now.  Thomas HarpXilin Efrain Clauson, MD  [facility] Pager [phone]  If 7PM-7AM, please contact night-coverage [email or web address] Password [email or web address] 08/29/2017, 6:39 AM

## 2017-08-29 NOTE — ED Notes (Signed)
Dinner tray at bedside

## 2017-08-29 NOTE — Plan of Care (Signed)
  Problem: Education: Goal: Knowledge of General Education information will improve Description: Including pain rating scale, medication(s)/side effects and non-pharmacologic comfort measures Outcome: Progressing   Problem: Health Behavior/Discharge Planning: Goal: Ability to manage health-related needs will improve Outcome: Progressing   Problem: Clinical Measurements: Goal: Ability to maintain clinical measurements within normal limits will improve Outcome: Progressing Goal: Will remain free from infection Outcome: Progressing Goal: Diagnostic test results will improve Outcome: Progressing Goal: Respiratory complications will improve Outcome: Progressing Goal: Cardiovascular complication will be avoided Outcome: Progressing   Problem: Activity: Goal: Risk for activity intolerance will decrease Outcome: Progressing   Problem: Nutrition: Goal: Adequate nutrition will be maintained Outcome: Progressing   Problem: Coping: Goal: Level of anxiety will decrease Outcome: Progressing   Problem: Elimination: Goal: Will not experience complications related to bowel motility Outcome: Progressing Goal: Will not experience complications related to urinary retention Outcome: Progressing   Problem: Pain Managment: Goal: General experience of comfort will improve Outcome: Progressing   Problem: Safety: Goal: Ability to remain free from injury will improve Outcome: Progressing   Problem: Skin Integrity: Goal: Risk for impaired skin integrity will decrease Outcome: Progressing   Problem: Education: Goal: Knowledge of disease or condition will improve Outcome: Progressing Goal: Knowledge of secondary prevention will improve Outcome: Progressing Goal: Knowledge of patient specific risk factors addressed and post discharge goals established will improve Outcome: Progressing   Problem: Coping: Goal: Will verbalize positive feelings about self Outcome: Progressing Goal: Will  identify appropriate support needs Outcome: Progressing   Problem: Health Behavior/Discharge Planning: Goal: Ability to manage health-related needs will improve Outcome: Progressing   Problem: Self-Care: Goal: Ability to participate in self-care as condition permits will improve Outcome: Progressing Goal: Verbalization of feelings and concerns over difficulty with self-care will improve Outcome: Progressing Goal: Ability to communicate needs accurately will improve Outcome: Progressing   Problem: Nutrition: Goal: Risk of aspiration will decrease Outcome: Progressing Goal: Dietary intake will improve Outcome: Progressing   Problem: Ischemic Stroke/TIA Tissue Perfusion: Goal: Complications of ischemic stroke/TIA will be minimized Outcome: Progressing   

## 2017-08-29 NOTE — ED Provider Notes (Signed)
[facility] EMERGENCY DEPARTMENT Provider Note   CSN: [id number] Arrival date & time: 08/28/17  2300     History   Chief Complaint Chief Complaint  Patient presents with  . Tingling    HPI Thomas [name] is a 52 y.o. male.  The history is provided by the patient.  He has history of hypertension, hyperlipidemia, stroke and comes in with concern of having another stroke.  He states that he noted tingling in his left arm and left leg when he woke up this morning at about 10 AM.  However, his wife states that he had complaints of the numbness and tingling at 3 AM.  After considerable time discussing with patient and his wife, does not clear when his last known normal time would have been, but clearly earlier than 3 AM.  He is also complaining some aching in the region of his left elbow and left knee.  He rates his pain at 5/10.  There has been some blurriness of vision.  He has noticed that he is weak on the left side.  He dropped a phone without realizing, and has had difficulty walking.  There is a bifrontal headache.  He is also complaining of some tightness in the left side of his chest.  When asked why he did not come to the ED the sooner, he states that he thought he would just lay in bed to see if things would improve.  Of note, he has not taken his blood pressure medication for the last week, although his wife did make him take his dose today.  Past Medical History:  Diagnosis Date  . Abnormal EKG   . Cardiomegaly   . Family history of heart disease   . H/O medication noncompliance   . Hyperlipidemia   . Hypertension   . Stroke (HCC) 11/07/2015  . Tobacco abuse     Patient Active Problem List   Diagnosis Date Noted  . Altered mental status 02/24/2016  . Urinary tract infection without hematuria 02/24/2016  . Acute encephalopathy 02/24/2016  . Chest pain 02/24/2016  . History of stroke   . Blurry vision, right eye 11/08/2015  . Acute cerebrovascular  accident (CVA) (HCC) 11/08/2015  . AKI (acute kidney injury) (HCC) 11/07/2015  . Hyperlipidemia 01/26/2014  . TOBACCO ABUSE 04/19/2008  . HYPERTENSION, BENIGN ESSENTIAL 01/22/2005    Past Surgical History:  Procedure Laterality Date  . ANKLE FRACTURE SURGERY    . NM MYOCAR PERF WALL MOTION  06/28/2011   protocol Bruce, normal perfusion nin all regions, post stress EF 57%,, exercise cap  . TRANSTHORACIC ECHOCARDIOGRAM  06/28/2011   EF=55%, Proximal septal thickening, borderline LA enlargement, boarderline aortic root dialation        Home Medications    Prior to Admission medications   Medication Sig Start Date End Date Taking? Authorizing Provider  amLODipine (NORVASC) 10 MG tablet Take 1 tablet (10 mg total) by mouth daily. 06/07/16  Yes Pricilla Loveless, MD  aspirin EC 325 MG tablet Take 325 mg by mouth daily.   Yes [provider]  buPROPion (WELLBUTRIN SR) 100 MG 12 hr tablet Take 100 mg by mouth 2 (two) times daily.   Yes [provider]  hydrALAZINE (APRESOLINE) 50 MG tablet Take 1 tablet (50 mg total) by mouth 2 (two) times daily. 06/07/16  Yes Pricilla Loveless, MD  hydrochlorothiazide (HYDRODIURIL) 25 MG tablet Take 1 tablet (25 mg total) by mouth daily. 06/07/16  Yes Pricilla Loveless, MD  paliperidone (INVEGA) 6 MG 24 hr tablet Take 6 mg by mouth at bedtime.   Yes [provider]  chlorhexidine (PERIDEX) 0.12 % solution Use as directed 15 mLs in the mouth or throat 2 (two) times daily. Patient not taking: Reported on 08/29/2017 05/03/17   Belinda FisherYu, Amy V, PA-C  meloxicam (MOBIC) 7.5 MG tablet Take 1 tablet (7.5 mg total) by mouth daily. Patient not taking: Reported on 08/29/2017 05/03/17   Belinda FisherYu, Amy V, PA-C  naproxen (NAPROSYN) 375 MG tablet Take 1 tablet (375 mg total) 2 (two) times daily by mouth. Patient not taking: Reported on 08/29/2017 11/27/16   Felicie MornSmith, Davaris Youtsey, NP    Family History Family History  Problem Relation Age of Onset  . Hypertension Mother     . Diabetes Mother   . Stroke Mother   . Hypertension Father   . Diabetes Father   . Stroke Father   . Hypertension Sister   . Stroke Sister   . Diabetes Sister   . Hyperlipidemia Maternal Grandmother   . Diabetes Maternal Grandmother   . Asthma Daughter   . Asthma Son     Social History Social History   Tobacco Use  . Smoking status: Former Smoker    Packs/day: 0.25    Years: 15.00    Pack years: 3.75    Types: Cigarettes    Last attempt to quit: 11/06/2015    Years since quitting: 1.8  . Smokeless tobacco: Never Used  . Tobacco comment: quit in Juy 2015  Substance Use Topics  . Alcohol use: Yes    Alcohol/week: 12.0 standard drinks    Types: 12 Cans of beer per week    Comment: 12 pack on weekends   . Drug use: No     Allergies   Patient has no known allergies.   Review of Systems Review of Systems  All other systems reviewed and are negative.    Physical Exam Updated Vital Signs BP (!) 170/116   Pulse (!) 45   Temp 98.1 F (36.7 C) (Oral)   Resp 13   Ht 6\' 3"  (1.905 m)   Wt 95.3 kg   SpO2 99%   BMI 26.25 kg/m   Physical Exam  Nursing note and vitals reviewed.  52 year old male, resting comfortably and in no acute distress. Vital signs are significant for elevated blood pressure and slow heart rate. Oxygen saturation is 99%, which is normal. Head is normocephalic and atraumatic. PERRLA, EOMI. Oropharynx is clear. Neck is nontender and supple without adenopathy or JVD.  There are no carotid bruits. Back is nontender and there is no CVA tenderness. Lungs are clear without rales, wheezes, or rhonchi. Chest is nontender. Heart has regular rate and rhythm without murmur. Abdomen is soft, flat, nontender without masses or hepatosplenomegaly and peristalsis is normoactive. Extremities have no cyanosis or edema, full range of motion is present. Skin is warm and dry without rash. Neurologic: Mental status is normal, cranial nerves are intact.  There  is slight weakness of the left arm and left leg compared with the right side with strength 4/5 in the left arm and left leg, 5/5 in the right arm and right leg.  There is decreased pinprick sensation diffusely throughout the left arm and left leg, but also on the left side of the face there is no pronator drift.  There is no extinction on double simultaneous stimulation.  Finger-to-nose testing is normal.  ED Treatments / Results  Labs (all labs ordered  are listed, but only abnormal results are displayed) Labs Reviewed  COMPREHENSIVE METABOLIC PANEL - Abnormal; Notable for the following components:      Result Value   Glucose, Bld 124 (*)    Creatinine, Ser 1.91 (*)    GFR calc non Af Amer 39 (*)    GFR calc Af Amer 45 (*)    All other components within normal limits  I-STAT CHEM 8, ED - Abnormal; Notable for the following components:   BUN 23 (*)    Creatinine, Ser 2.00 (*)    Glucose, Bld 125 (*)    Calcium, Ion 1.14 (*)    All other components within normal limits  PROTIME-INR  APTT  CBC  DIFFERENTIAL  HIV ANTIBODY (ROUTINE TESTING)  HEMOGLOBIN A1C  LIPID PANEL  I-STAT TROPONIN, ED  CBG MONITORING, ED    EKG EKG Interpretation  Date/Time:  Thursday August 29 2017 02:45:33 EDT Ventricular Rate:  47 PR Interval:    QRS Duration: 122 QT Interval:  472 QTC Calculation: 418 R Axis:   -39 Text Interpretation:  Age not entered, assumed to be  52 years old for purpose of ECG interpretation Sinus bradycardia Borderline prolonged PR interval Probable left atrial enlargement IVCD, consider atypical RBBB Left ventricular hypertrophy Nonspecific T abnormalities, lateral leads When compared with ECG of 08/28/2017, No significant change was found Confirmed by Dione Booze ([id number]) on 08/29/2017 2:52:20 AM   Radiology Ct Head Wo Contrast  Result Date: 08/28/2017 CLINICAL DATA:  Tingling and weakness in the left arm and leg since 9 a.m. EXAM: CT HEAD WITHOUT CONTRAST TECHNIQUE: Contiguous  axial images were obtained from the base of the skull through the vertex without intravenous contrast. COMPARISON:  None. FINDINGS: Brain: Chronic small bilateral cerebellar infarcts. Stable tiny right basal ganglial hypodensities that may reflect small lacunar infarcts. Gray-white matter distinction is otherwise maintained with minimal small vessel ischemic disease of periventricular white matter. No hydrocephalus. Patent midline fourth ventricle and basal cisterns. No intra-axial mass nor extra-axial fluid collection. Vascular: Mild atherosclerosis of the carotid siphons. Skull: Negative for fracture or suspicious osseous lesions. Sinuses/Orbits: Intact Other: Clear bilateral mastoids. IMPRESSION: No acute intracranial abnormality. Chronic stable bilateral small cerebellar infarcts and probable right basal ganglial lacunar infarcts. Electronically Signed   By: Tollie Eth M.D.   On: 08/28/2017 23:47   Mr Brain Wo Contrast  Result Date: 08/29/2017 CLINICAL DATA:  Left arm tingling and weakness EXAM: MRI HEAD WITHOUT CONTRAST TECHNIQUE: Multiplanar, multiecho pulse sequences of the brain and surrounding structures were obtained without intravenous contrast. COMPARISON:  Head CT 08/28/2017 FINDINGS: BRAIN: Small, 6 mm focus of abnormal diffusion restriction within the dorsal aspect of the right pons. No other diffusion restriction abnormality. The midline structures are normal. Old bilateral cerebellar and left pontine infarcts, in addition to multiple old basal ganglia infarcts. Multifocal white matter hyperintensity, most commonly due to chronic ischemic microangiopathy. the CSF spaces are normal for age, with no hydrocephalus. Susceptibility-sensitive sequences show no chronic microhemorrhage or superficial siderosis. VASCULAR: Major intracranial arterial and venous sinus flow voids are preserved. SKULL AND UPPER CERVICAL SPINE: The visualized skull base, calvarium, upper cervical spine and extracranial soft  tissues are normal. SINUSES/ORBITS: No fluid levels or advanced mucosal thickening. No mastoid or middle ear effusion. The orbits are normal. IMPRESSION: 1. Small acute infarct of the right dorsal pons without hemorrhage or mass effect. 2. Multiple old basal ganglia and cerebellar infarcts. Electronically Signed   By: Deatra Robinson M.D.   On:  08/29/2017 05:29    Procedures Procedures   Medications Ordered in ED Medications  aspirin EC tablet 325 mg (has no administration in time range)  hydrALAZINE (APRESOLINE) tablet 50 mg (has no administration in time range)  buPROPion (WELLBUTRIN SR) 12 hr tablet 100 mg (has no administration in time range)  paliperidone (INVEGA) 24 hr tablet 6 mg (has no administration in time range)   stroke: mapping our early stages of recovery book (has no administration in time range)  0.9 %  sodium chloride infusion (has no administration in time range)  acetaminophen (TYLENOL) tablet 650 mg (has no administration in time range)    Or  acetaminophen (TYLENOL) solution 650 mg (has no administration in time range)    Or  acetaminophen (TYLENOL) suppository 650 mg (has no administration in time range)  senna-docusate (Senokot-S) tablet 1 tablet (has no administration in time range)  heparin injection 5,000 Units (has no administration in time range)  LORazepam (ATIVAN) injection 1 mg (has no administration in time range)  LORazepam (ATIVAN) injection 1 mg (1 mg Intravenous Given 08/29/17 0417)     Initial Impression / Assessment and Plan / ED Course  I have reviewed the triage vital signs and the nursing notes.  Pertinent labs & imaging results that were available during my care of the patient were reviewed by me and considered in my medical decision making (see chart for details).  Left-sided numbness and weakness which may represent stroke, but exam is showing inconsistencies which make me doubtful that there is is an acute stroke.  Because of the time since  last known normal, he is not a candidate for thrombolytic therapy.  Deficits are not sufficient to make him a candidate for possible endovascular approach, so code stroke is not activated.  He was sent for CT scan which showed no acute findings.  Laboratory work-up shows no insufficiency which is unchanged from baseline.  He is being sent for MRI scan to rule out new stroke.  MRI shows small acute infarct in the right dorsal pons, as well as old basal ganglia and cerebellar infarcts.  Case discussed with Dr. Clyde Lundborg of [facility], who agrees to admit the patient.  Case is discussed with Dr. Amada Jupiter of neurology service agrees to see the patient in consultation.  Final Clinical Impressions(s) / ED Diagnoses   Final diagnoses:  Cerebrovascular accident (CVA), unspecified mechanism (HCC)  Poorly-controlled hypertension  Renal insufficiency    ED Discharge Orders    None       Dione Booze, MD 08/29/17 [phone]

## 2017-08-29 NOTE — ED Notes (Signed)
PT/OT at bedside for evaluation.

## 2017-08-29 NOTE — ED Notes (Signed)
Patient transported to MRI 

## 2017-08-29 NOTE — Progress Notes (Signed)
08/29/17 1101  PT Visit Information  Last PT Received On 08/29/17  Assistance Needed +1  History of Present Illness Thomas [name] is a 52 y.o. male who presented to the ED complaining of L sided tingling, weakness, and facial drooping. MRI significant for an acute infarct in the R pons. PMH of HTN, HLD, CVA in 10/2015, bipolar disorder, tobacco abuse.  Precautions  Precautions Fall;Other (comment)  Precaution Comments Permissive hypertension; 220/120   Restrictions  Weight Bearing Restrictions No  Home Living  Family/patient expects to be discharged to: Private residence  Living Arrangements Spouse/significant other  Available Help at Discharge Family;Available 24 hours/day  Type of Home House  Home Access Stairs to enter  Entrance Stairs-Number of Steps 1 (Threshold step)  Entrance Stairs-Rails None  Home Layout One level  Bathroom Shower/Tub Tub only (Pt plans on taking baths)  Horticulturist, commercial Yes  Home Equipment None (Pt report he lost previous equipment)  Prior Function  Level of Independence Independent  Comments Pt reports he had SOB walking around at home, and would pause and rest. Pt reports he was able to get dressed independently, and perform in a church musical group, but would fatigue easily.  Communication  Communication No difficulties  Pain Assessment  Pain Assessment Faces  Faces Pain Scale 0  Cognition  Arousal/Alertness Awake/alert  Behavior During Therapy Flat affect  Overall Cognitive Status No family/caregiver present to determine baseline cognitive functioning  General Comments Pt was drowsy throughout session, frequently sat with eyes closed, but was able to answer questions and was 4x oriented.   Upper Extremity Assessment  Upper Extremity Assessment LUE deficits/detail  LUE Deficits / Details Pt was able to raises arms overhead, but had difficulty performing slow, single-tap thumb-finger taps. Pt inclined to tap  each finger multiple times, and miss pinky, even with cues. Pt reported no sensory loss or tingling, but not formally tested.    LUE Coordination decreased fine motor  Lower Extremity Assessment  Lower Extremity Assessment LLE deficits/detail  LLE Deficits / Details Pt was able to perform L SLR supine, but had difficulty performing alternating toe taps while seated at the EOB. Pt reported no sensory loss or tingling, but not formally tested.  LLE Coordination decreased fine motor  Cervical / Trunk Assessment  Cervical / Trunk Assessment Normal  Bed Mobility  Overal bed mobility Needs Assistance  Bed Mobility Supine to Sit;Sit to Supine  Supine to sit Supervision  Sit to supine Supervision  General bed mobility comments Pt performed bed mobility with Supervision, and reported some dizziness with sitting up right. Once in sitting BP elevated to 180/134 mmHg which was outside of permissive HTN range in MD notes, so pt returned to supine with supervision.   Transfers  General transfer comment Deferred secondary to BP  Ambulation/Gait  General Gait Details Deferred secondary to BP  Balance  Overall balance assessment Needs assistance  Sitting-balance support Bilateral upper extremity supported;Feet supported  Sitting balance-Leahy Scale Fair  Sitting balance - Comments Pt was able to scoot hips to EOB.  Standing balance comment Deferred secondary to high BP  General Comments  General comments (skin integrity, edema, etc.) Pt is pleasant.  PT - End of Session  Activity Tolerance Treatment limited secondary to medical complications (Comment) (Pt limited by high BP)  Patient left in bed;with call bell/phone within reach;with nursing/sitter in room  Nurse Communication Mobility status;Other (comment) (Elevated BP)  PT Assessment  PT Recommendation/Assessment Patient needs continued PT  services  PT Visit Diagnosis Other abnormalities of gait and mobility (R26.89);Unsteadiness on feet  (R26.81);Muscle weakness (generalized) (M62.81);Dizziness and giddiness (R42);Other symptoms and signs involving the nervous system (R29.898);Hemiplegia and hemiparesis  Hemiplegia - Right/Left Left  Hemiplegia - dominant/non-dominant Non-dominant  Hemiplegia - caused by Cerebral infarction  PT Problem List Decreased activity tolerance;Decreased coordination;Decreased mobility;Decreased balance;Decreased knowledge of use of DME;Decreased strength  PT Plan  PT Frequency (ACUTE ONLY) Min 4X/week  PT Treatment/Interventions (ACUTE ONLY) Gait training;DME instruction;Functional mobility training;Therapeutic activities;Therapeutic exercise;Neuromuscular re-education;Patient/family education;Stair training;Balance training  AM-PAC PT "6 Clicks" Daily Activity Outcome Measure  Difficulty turning over in bed (including adjusting bedclothes, sheets and blankets)? 4  Difficulty moving from lying on back to sitting on the side of the bed?  4  Difficulty sitting down on and standing up from a chair with arms (e.g., wheelchair, bedside commode, etc,.)? 1  Help needed moving to and from a bed to chair (including a wheelchair)? 3  Help needed walking in hospital room? 3  Help needed climbing 3-5 steps with a railing?  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 Pt performed bed mobility with Supervision. Pt presents with some decreased fine motor capabilities in L Extremities. Mobility limited by high BP; 180/124 mmHg in supine, 180/134 mmHg sitting at EOB, and 190/123 mmHg upon returning to supine. PT deferred activity at this time due to increase in diastolic BP upon transferring. Will continue to follow acutely to support independence, mobility, and safety.   Demetria PoreJulia Martino Tompson, S-DPT Acute Care Rehab Student [phone]

## 2017-08-30 LAB — BASIC METABOLIC PANEL
Anion gap: 13 (ref 5–15)
BUN: 14 mg/dL (ref 6–20)
CALCIUM: 9.7 mg/dL (ref 8.9–10.3)
CO2: 23 mmol/L (ref 22–32)
Chloride: 103 mmol/L (ref 98–111)
Creatinine, Ser: 1.64 mg/dL — ABNORMAL HIGH (ref 0.61–1.24)
GFR calc Af Amer: 54 mL/min — ABNORMAL LOW (ref >60)
GFR, EST NON AFRICAN AMERICAN: 47 mL/min — AB (ref >60)
GLUCOSE: 104 mg/dL — AB (ref 70–99)
POTASSIUM: 3.5 mmol/L (ref 3.5–5.1)
Sodium: 139 mmol/L (ref 135–145)

## 2017-08-30 LAB — TSH: TSH: 2.068 u[IU]/mL (ref 0.350–4.500)

## 2017-08-30 LAB — VITAMIN B12: Vitamin B-12: 326 pg/mL (ref 180–914)

## 2017-08-30 LAB — HIV ANTIBODY (ROUTINE TESTING W REFLEX): HIV SCREEN 4TH GENERATION: NONREACTIVE

## 2017-08-30 MED ORDER — CLOPIDOGREL BISULFATE 75 MG PO TABS: 75.0000 mg | ORAL_TABLET | Freq: Every day | ORAL | 0 refills | Status: DC

## 2017-08-30 MED ORDER — NICOTINE 14 MG/24HR TD PT24
14.0000 mg | MEDICATED_PATCH | Freq: Every day | TRANSDERMAL | Status: DC
  Administered 2017-08-30: 14 mg via TRANSDERMAL
  Filled 2017-08-30: qty 1

## 2017-08-30 MED ORDER — ATORVASTATIN CALCIUM 40 MG PO TABS: 40.0000 mg | ORAL_TABLET | Freq: Every day | ORAL | 0 refills | Status: DC

## 2017-08-30 MED ORDER — HYDRALAZINE HCL 50 MG PO TABS: 50.0000 mg | ORAL_TABLET | Freq: Three times a day (TID) | ORAL | 0 refills | Status: DC

## 2017-08-30 MED ORDER — ASPIRIN 81 MG PO TBEC: 81.0000 mg | DELAYED_RELEASE_TABLET | Freq: Every day | ORAL | 0 refills | Status: AC

## 2017-08-30 NOTE — Progress Notes (Addendum)
NEUROHOSPITALISTS STROKE TEAM - DAILY PROGRESS NOTE    HISTORY 52 y.o. 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  Per patient symptoms have been waxing and waning and on assessment  He denies left sided paresthesia and admits to improvement of left sided weakness and mobility.  Noncon CT of the head showed chronic stable bilateral small cerebral infarcts without any new acute intracranial abnormality, MRI of the brain showed a small acute infarct of the right dorsal pons and MR a of the head did not show a large or medium vessel occlusion or correctable proximal stenosis.  LSN: 08/27/17 at bedtime tPA Given: No: Out of the time frame window NIHSS:  1 On admission Baseline Premorbid modified Rankin scale (mRS):0  SUBJECTIVE Patient in bed with no complaints, states numbness, tingling resolved yesterday and he continues to endorse improvement in mobility.  He admits to headache overnight, but resolved after drinking water and sleeping  OBJECTIVE Most recent Vital Signs: Vitals:   08/29/17 1737 08/29/17 1950 08/30/17 0500 08/30/17 0753  BP: (!) 173/131 (!) 180/120 (!) 140/98 (!) 163/103  Pulse: 67 75 90 91  Resp: 18 18 18 16   Temp: 97.7 F (36.5 C) 98.1 F (36.7 C) 98.2 F (36.8 C) 97.8 F (36.6 C)  TempSrc: Oral Oral Oral Oral  SpO2: 98% 95% 96% 97%  Weight:      Height:       CBG (last 3)  No results for input(s): GLUCAP in the last 72 hours.  Physical Exam  HEENT-  Normocephalic, no lesions, without obvious abnormality.  Normal external eye and conjunctiva.   Cardiovascular- S1-S2 audible,  pulses palpable throughout   Lungs-no rhonchi or wheezing noted, no excessive working breathing.  Saturations within normal limits Abdomen- All 4 quadrants palpated and nontender Musculoskeletal-no joint tenderness, deformity or swelling Skin-warm and dry  Neuro:  Mental Status: Alert, oriented, thought content appropriate.  Speech fluent without evidence of aphasia.  Able to follow 3 step commands without difficulty. Cranial Nerves: II: Visual fields grossly normal,  III,IV, VI: ptosis not present, extra-ocular motions intact bilaterally pupils equal, round, reactive to light  V,VII: smile symmetric, facial light touch sensation normal bilaterally VIII: Hearing intact to voice IX,X: uvula rises symmetrically XI: bilateral shoulder shrug XII: midline tongue extension Motor: She does move all extremities equally with equal strength with hip flexion, knee flexion, bicep flexion, slightly weaker in the left hand grip and left foot flexion Right :  Upper extremity   5/5                                      Left:     Upper extremity   4.5/5             Lower extremity   5/5  Lower extremity   4.5/5 Tone and bulk:normal tone throughout; no atrophy noted Sensory: Pinprick and light touch sensation and temperature intact bilaterally Deep Tendon Reflexes: 2+ and symmetric throughout Plantars: Right: downgoing                                Left: downgoing Cerebellar: normal finger-to-nose, normal heel-to-shin test, normal orbiting Gait: Not tested  IV Fluid Intake:    MEDICATIONS  . amLODipine  10 mg Oral Daily  . aspirin EC  81 mg Oral Daily  . atorvastatin  40 mg Oral q1800  . buPROPion  100 mg Oral BID WC  . clopidogrel  75 mg Oral Daily  . heparin  5,000 Units Subcutaneous Q8H  . hydrALAZINE  50 mg Oral Q8H  . nicotine  14 mg Transdermal Daily  . paliperidone  6 mg Oral QHS   PRN:  acetaminophen **OR** acetaminophen (TYLENOL) oral  liquid 160 mg/5 mL **OR** acetaminophen, hydrALAZINE, senna-docusate  Diet:   Diet Order            Diet Heart Room service appropriate? Yes; Fluid consistency: Thin  Diet effective now              CLINICALLY SIGNIFICANT STUDIES Basic Metabolic Panel:  Recent Labs  Lab 08/28/17 2317 08/28/17 2328 08/30/17 0454  NA 139 141 139  K 4.0 3.7 3.5  CL 106 104 103  CO2 25  --  23  GLUCOSE 124* 125* 104*  BUN 20 23* 14  CREATININE 1.91* 2.00* 1.64*  CALCIUM 9.2  --  9.7   Liver Function Tests:  Recent Labs  Lab 08/28/17 2317  AST 25  ALT 20  ALKPHOS 79  BILITOT 0.5  PROT 6.7  ALBUMIN 3.9   CBC:  Recent Labs  Lab 08/28/17 2317 08/28/17 2328  WBC 4.7  --   NEUTROABS 1.8  --   HGB 15.9 16.0  HCT 47.3 47.0  MCV 87.6  --   PLT 191  --    Coagulation:  Recent Labs  Lab 08/28/17 2317  LABPROT 13.0  INR 0.99   Cardiac Enzymes: No results for input(s): CKTOTAL, CKMB, CKMBINDEX, TROPONINI in the last 168 hours. Urinalysis: No results for input(s): COLORURINE, LABSPEC, PHURINE, GLUCOSEU, HGBUR, BILIRUBINUR, KETONESUR, PROTEINUR, UROBILINOGEN, NITRITE, LEUKOCYTESUR in the last 168 hours.  Invalid input(s): APPERANCEUR Lipid Panel    Component Value Date/Time   CHOL 180 08/28/2017 2317   TRIG 281 (H) 08/28/2017 2317   HDL 36 (L) 08/28/2017 2317   CHOLHDL 5.0 08/28/2017 2317   VLDL 56 (H) 08/28/2017 2317   LDLCALC 88 08/28/2017 2317   HgbA1C  Lab Results  Component Value Date   HGBA1C 5.1 08/28/2017    Urine Drug Screen:      Component Value Date/Time   LABOPIA NONE DETECTED 08/29/2017 2258   COCAINSCRNUR NONE DETECTED 08/29/2017 2258   LABBENZ NONE DETECTED 08/29/2017 2258   AMPHETMU NONE DETECTED 08/29/2017 2258   THCU POSITIVE (A) 08/29/2017 2258   LABBARB NONE DETECTED 08/29/2017 2258    Alcohol Level: No results for input(s): ETH in the last 168 hours.  Ct Head Wo Contrast 08/28/2017 IMPRESSION: No acute intracranial abnormality. Chronic stable  bilateral small cerebellar infarcts and probable right basal ganglial lacunar infarcts.   Mr Brain Wo Contrast 08/29/2017 IMPRESSION: 1. Small acute infarct of the right dorsal pons without hemorrhage or mass effect.  2. Multiple old basal ganglia and  cerebellar infarcts.  Mr Maxine Glenn Head Wo Contrast  08/29/2017 IMPRESSION: No large or medium vessel occlusion or correctable proximal stenosis. Atherosclerotic narrowing and irregularity of the more distal intracranial branch vessels as seen previously.   Dg Chest Port 1 View 08/29/2017 IMPRESSION:  1. Aorta appears more prominent than on prior examination. This may be related to differences in technique and degree of inspiration. This can be assessed on follow-up two-view chest with better inspiration.  2. Cardiomegaly.  3.  No infiltrate or congestive heart failure.  2D echo 08/29/17 Study Conclusions - Left ventricle: The cavity size was normal. Wall thickness was   increased in a pattern of moderate LVH. Systolic function was   normal. The estimated ejection fraction was in the range of 60%   to 65%. Wall motion was normal; there were no regional wall   motion abnormalities. Doppler parameters are consistent with   abnormal left ventricular relaxation (grade 1 diastolic   dysfunction). - Aortic valve: A bicuspid morphology cannot be excluded; normal   thickness, mildly calcified leaflets. There was mild   regurgitation. - Left atrium: The atrium was severely dilated.  Carotid Dopplers  08/29/17 Right and LEft Carotid: Velocities in the right ICA are consistent with a 1-39% stenosis. Vertebrals: Bilateral vertebral arteries demonstrate antegrade flow.  EKG  NSR -  Possible eft atrial enlargement. For complete results please see formal report.    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  Patient with previous history  of right cerebellar infarct and old small brain infarct in October 2017  Code Stroke: No  CT of the brain: No acute intracranial abnormality. Chronic stable bilateral small cerebellar infarcts and probable right basal ganglial lacunar infarcts  MRI of the brain: Small acute infarct of the right dorsal pons  MRA of the brain : No large or medium vessel occlusion or correctable proximal stenosis.  Carotid Doppler : Bilateral internal carotid arteries are 1-39%. Vertebral arteries are patent with antegrade flow  2D Echocardiogram: EF 60-65% - No mention of apical thrombus  CXR : Cardiomegaly, no infiltrate or CHF noted  LDL: 88  HgbA1c :5.1  VTE: Heparin SQ  Antiplatelets : Aspirin 81 mg and Plavix 75 mg x 3 weeks, then Plavix alone  Atorvastatin 40 mg daily  Continue Rehab with PT consult, OT consult, Speech consult  Therapy recommendations:   No further recommendations  Disposition:  Home  Follow-up with [facility] in 4 weeks   Hypertensive emergency  Patient's blood pressure  On admission was 187/133 Allowed for  SBP> 220/120 over 24 hours  Resume patient's blood pressure medication moderate pain 10 mg, hydralazine 50 mg HCTZ 25 mg  Recommend tight blood pressure control in the setting of recurrent strokes     Hyperlipidemia  Home meds:  None  LDL 88 goal < 70  Add lipitor 40 mg daily  Continue statin at discharge  Tobacco abuse  Tobacco Cessation discussed with patient who requested for an nicotine patch   Other Stroke Risk Factors  ETOH use  Tobacco abuse   family hx stroke (Mother)    Other Active Problems  Bipolar Disorder  CKD III  Hospital day # 1  SIGNED Noralee Space [facility] Neuro-hospitalist Team [phone] 08/30/2017, 12:37 PM   08/30/2017 ATTENDING ASSESSMENT   ATTENDING NOTE: I reviewed above note and agree with the assessment and plan. I have made any additions or clarifications directly  to the above note.    52 year old male with  history of bipolar, CKD, hypertension, hyperlipidemia, smoker, cardiomyopathy, history of stroke presented with left-sided weakness, numbness and difficulty walking.  He had a stroke in 10/2014 with acute punctate midbrain infarct.  MRI also showed old bilateral cerebellar infarcts.  MRA head and neck unremarkable.  EF 60 to 65%.  Put on aspirin and Lipitor.  In 02/2016 patient came in with right sided numbness and weakness.  MRI negative.  MRA negative.  Carotid Doppler unremarkable.  EF 60 to 65%.  LDL 130 and A1c 5.0 UDS showed THC positive.  Also treated with UTI and AKI.  Discharged on aspirin.  In 05/2016 patient admitted for left facial pain and left-sided weakness.  CT negative.  Again THC positive.  Considered hypertensive encephalopathy with functional component.  On this admission, CT no acute abnormality and MRI showed right dorsal pontine small infarct.  MRA unremarkable.  LDL 88 and A1c 5.1 EF 60 to 65%.  Carotid Doppler unremarkable.  UDS again positive for THC.  Creatinine 2.00.   Patient stroke consistent with small vessel disease.  Recommend to quit smoking.  Put on aspirin 81 and Plavix 75 DAPT for 3 weeks and then Plavix alone.  Continue Lipitor 40.  Stroke risk factor modification. BP still high, will resume home BP meds. PT/OT.  Neurology will sign off. Please call with questions. Pt will follow up with stroke clinic NP at [facility] in about 4 weeks. Thanks for the consult.   Marvel Plan, MD PhD Stroke Neurology 08/30/2017 6:42 PM      To contact Stroke Continuity provider, please refer to [email or web address]. After hours, contact General Neurology

## 2017-08-30 NOTE — Plan of Care (Signed)
  Problem: Education: Goal: Knowledge of General Education information will improve Description: Including pain rating scale, medication(s)/side effects and non-pharmacologic comfort measures Outcome: Progressing   Problem: Health Behavior/Discharge Planning: Goal: Ability to manage health-related needs will improve Outcome: Progressing   Problem: Clinical Measurements: Goal: Ability to maintain clinical measurements within normal limits will improve Outcome: Progressing Goal: Will remain free from infection Outcome: Progressing Goal: Diagnostic test results will improve Outcome: Progressing Goal: Respiratory complications will improve Outcome: Progressing Goal: Cardiovascular complication will be avoided Outcome: Progressing   Problem: Activity: Goal: Risk for activity intolerance will decrease Outcome: Progressing   Problem: Nutrition: Goal: Adequate nutrition will be maintained Outcome: Progressing   Problem: Coping: Goal: Level of anxiety will decrease Outcome: Progressing   Problem: Elimination: Goal: Will not experience complications related to bowel motility Outcome: Progressing Goal: Will not experience complications related to urinary retention Outcome: Progressing   Problem: Pain Managment: Goal: General experience of comfort will improve Outcome: Progressing   Problem: Safety: Goal: Ability to remain free from injury will improve Outcome: Progressing   Problem: Skin Integrity: Goal: Risk for impaired skin integrity will decrease Outcome: Progressing   Problem: Education: Goal: Knowledge of disease or condition will improve Outcome: Progressing Goal: Knowledge of secondary prevention will improve Outcome: Progressing Goal: Knowledge of patient specific risk factors addressed and post discharge goals established will improve Outcome: Progressing   Problem: Coping: Goal: Will verbalize positive feelings about self Outcome: Progressing Goal: Will  identify appropriate support needs Outcome: Progressing   Problem: Health Behavior/Discharge Planning: Goal: Ability to manage health-related needs will improve Outcome: Progressing   Problem: Self-Care: Goal: Ability to participate in self-care as condition permits will improve Outcome: Progressing Goal: Verbalization of feelings and concerns over difficulty with self-care will improve Outcome: Progressing Goal: Ability to communicate needs accurately will improve Outcome: Progressing   Problem: Nutrition: Goal: Risk of aspiration will decrease Outcome: Progressing Goal: Dietary intake will improve Outcome: Progressing   Problem: Ischemic Stroke/TIA Tissue Perfusion: Goal: Complications of ischemic stroke/TIA will be minimized Outcome: Progressing   

## 2017-08-30 NOTE — Discharge Summary (Signed)
Discharge Summary  Thomas [name] NUU:[id number] DOB: Mar 11, 1965  PCP: Fleet Contras, MD  Admit date: 08/28/2017 Discharge date: 08/30/2017  Time spent:  Recommendations for Outpatient Follow-up:  1. F/u with PMD within a week  for hospital discharge follow up, repeat cbc/bmp at follow up 2. F/u with stroke clinic in 4 weeks  Discharge Diagnoses:  Active Hospital Problems   Diagnosis Date Noted  . Acute cerebrovascular accident (CVA) (HCC) 11/08/2015  . Bipolar disorder (HCC)   . Acute kidney injury superimposed on CKD (HCC) 11/07/2015  . Hyperlipidemia 01/26/2014  . TOBACCO ABUSE 04/19/2008  . HYPERTENSION, BENIGN ESSENTIAL 01/22/2005    Resolved Hospital Problems  No resolved problems to display.    Discharge Condition: stable  Diet recommendation: heart healthy  Filed Weights   08/28/17 2309  Weight: 95.3 kg    History of present illness: ( per admitting provider NP Ms Vedia Coffer) PCP: Fleet Contras, MD Patient coming from: home  Chief Complaint: left sided tingling/weakness  HPI: Thomas [name] is a 52 y.o. male with medical history significant stroke, hypertension, hyperlipidemia, tobacco use, bipolar disorder presents to the emergency Department chief complaint of left-sided numbness tingling and weakness. Initial evaluation includes an MR of the brain confirming acute stroke. [facility] are asked to admit  Information is obtained from the patient and his wife who is at the bedside as well as the chart. He noted some tingling in his left arm and leg yesterday when he awakened around 10 AM. Wife reports he did not say anything about it until 3 AM this morning. Associated symptoms include pain in the left elbow and left knee as well as blurred vision and generalized weakness on the left side. Wife states she noted and unsteady gait. Patient reports he was on the telephone and dropped it without realizing that he could not grip the phone. He denies  difficulty chewing or swallowing. Wife states that initially his speech was slurred but that has cleared up. He denies headache dizziness syncope or near-syncope. He denies chest pain palpitation shortness of breath diaphoresis nausea or vomiting. He denies any fever chills cough lower extremity edema. He denies dysuria hematuria frequency or urgency. He denies any diarrhea constipation melena bright red blood per rectum. He notes he did not come to the hospital sooner even though he's had strokes before because he just  Wanted to "wait and see if it got better". Wife reports inconsistency with his blood pressure medication.   ED Course: in the emergency department he's afebrile hypertensive with a heart rate the low end of normal. He is not hypoxic.   Hospital Course:  Principal Problem:   Acute cerebrovascular accident (CVA) (HCC) Active Problems:   TOBACCO ABUSE   HYPERTENSION, BENIGN ESSENTIAL   Hyperlipidemia   Acute kidney injury superimposed on CKD (HCC)   Bipolar disorder (HCC)  #1. Acute CVA. - Patient presents with left-sided numbness tingling weakness and unsteady gait. Risk factors include uncontrolled blood pressure, hyperlipidemia, tobacco use, family medical history.  -CT of the head reveals no acute intracranial abnormality chronic stable bilateral small cerebral infarcts and probable right basal ganglia in lacunar infarcts.  - MRI showed small acute infarct of the right dorsal pons without hemorrhage or mass effect. -MRA: No large or medium vessel occlusion or correctable proximal stenosis. Atherosclerotic narrowing and irregularity of the more distal intracranial branch vessels as seen previously.  -Patient reports symptoms "come and go" at the time of admission. Symptom has much improved at  time of discharge. -He passed his bedside swallow eval, he did well with PT -telemetry unremarkable -Carotid Dopplers unremarkable, 2-D echo no embolic source identified. He does has  grade I diastolic dysfunction. -Lipid panel  ldl 88, hemoglobin A1c 5.1 -neurology consulted who states " Pontine infarct likely secondary to small vessel disease patient with uncontrolled hypertension, medication non compliance and tobacco use.  Patient with previous history of right cerebellar infarct and old small brain infarct in October 2017" -neurology recommended asa/plavix for three weeks then plavix along, start statin, bp control. -patient is cleared to d/c home by neurology and patient is advised to follow with neurology follow up in 4 weeks.   #2. Hypertension. Uncontrolled. Wife reports noncompliance with his blood pressure medicines. Home medicines include Norvasc, hydralazine, hydrochlorothiazide. Chart review indicates he also previously on lisinopril -he is allowed to have permissive hypertension initially, home bp meds norvasc resumed, hydralazine dose changed from bid to tid,  hydrochlorothiazide discontinued due to underline ckd and sign of dehydration.  -f/u with pcp and neurology  #3. Hyperlipidemia -start Statin  #4. 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 Creatinine 2.0 on admission. Baseline appears to be 1.4 -cr improved after gentle hydration, hctz discontinued .  - follow up with pcp   #5. Bipolar disorder. Patient on disability related to same. Appears stable at baseline. Home meds include paliperidone. -continue home meds  #6. Tobacco use -cessation counseling offered   DVT prophylaxis:  heparin Code Status: full  Family Communication: wife at bedside  Disposition Plan: home  Consults called: neuro   Discharge Exam: BP (!) 157/124 (BP Location: Left Arm) Comment: RN Notified  Pulse 92   Temp 97.8 F (36.6 C) (Oral)   Resp 16   Ht 6\' 3"  (1.905 m)   Wt 95.3 kg   SpO2 100%   BMI 26.25 kg/m   General: NAD, aaox4, ambulatory with steady gait Cardiovascular: RRR Respiratory: CTABL  Discharge Instructions You  were cared for by a hospitalist during your hospital stay. If you have any questions about your discharge medications or the care you received while you were in the hospital after you are discharged, you can call the unit and asked to speak with the hospitalist on call if the hospitalist that took care of you is not available. Once you are discharged, your primary care physician will handle any further medical issues. Please note that NO REFILLS for any discharge medications will be authorized once you are discharged, as it is imperative that you return to your primary care physician (or establish a relationship with a primary care physician if you do not have one) for your aftercare needs so that they can reassess your need for medications and monitor your lab values.  Discharge Instructions    Ambulatory referral to Neurology   Complete by:  As directed    An appointment is requested in approximately: 4 weeks   Diet - low sodium heart healthy   Complete by:  As directed    Increase activity slowly   Complete by:  As directed      Allergies as of 08/30/2017   No Known Allergies     Medication List    STOP taking these medications   hydrochlorothiazide 25 MG tablet Commonly known as:  HYDRODIURIL     TAKE these medications   amLODipine 10 MG tablet Commonly known as:  NORVASC Take 1 tablet (10 mg total) by mouth daily.   aspirin 81 MG EC  tablet Take 1 tablet (81 mg total) by mouth daily for 21 days. Start taking on:  08/31/2017 What changed:    medication strength  how much to take   atorvastatin 40 MG tablet Commonly known as:  LIPITOR Take 1 tablet (40 mg total) by mouth daily at 6 PM.   buPROPion 100 MG 12 hr tablet Commonly known as:  WELLBUTRIN SR Take 100 mg by mouth 2 (two) times daily.   clopidogrel 75 MG tablet Commonly known as:  PLAVIX Take 1 tablet (75 mg total) by mouth daily. Start taking on:  08/31/2017   hydrALAZINE 50 MG tablet Commonly known as:   APRESOLINE Take 1 tablet (50 mg total) by mouth every 8 (eight) hours. What changed:  when to take this   paliperidone 6 MG 24 hr tablet Commonly known as:  INVEGA Take 6 mg by mouth at bedtime.      No Known Allergies Follow-up Information    Fleet Contras, MD Follow up in 1 week(s).   Specialty:  Internal Medicine Why:  hospital discharge follow up, repeat cbc/bmp at follow up. Contact information: [address] [place] [address] [phone]        Marvel Plan, MD Follow up in 4 week(s).   Specialty:  Neurology Why:  Follow up with Neurologist after stroke Contact information: [address] [place] [address] [phone]            The results of significant diagnostics from this hospitalization (including imaging, microbiology, ancillary and laboratory) are listed below for reference.    Significant Diagnostic Studies: Ct Head Wo Contrast  Result Date: 08/28/2017 CLINICAL DATA:  Tingling and weakness in the left arm and leg since 9 a.m. EXAM: CT HEAD WITHOUT CONTRAST TECHNIQUE: Contiguous axial images were obtained from the base of the skull through the vertex without intravenous contrast. COMPARISON:  None. FINDINGS: Brain: Chronic small bilateral cerebellar infarcts. Stable tiny right basal ganglial hypodensities that may reflect small lacunar infarcts. Gray-white matter distinction is otherwise maintained with minimal small vessel ischemic disease of periventricular white matter. No hydrocephalus. Patent midline fourth ventricle and basal cisterns. No intra-axial mass nor extra-axial fluid collection. Vascular: Mild atherosclerosis of the carotid siphons. Skull: Negative for fracture or suspicious osseous lesions. Sinuses/Orbits: Intact Other: Clear bilateral mastoids. IMPRESSION: No acute intracranial abnormality. Chronic stable bilateral small cerebellar infarcts and probable right basal ganglial lacunar infarcts. Electronically Signed   By:  Tollie Eth M.D.   On: 08/28/2017 23:47   Mr Brain Wo Contrast  Result Date: 08/29/2017 CLINICAL DATA:  Left arm tingling and weakness EXAM: MRI HEAD WITHOUT CONTRAST TECHNIQUE: Multiplanar, multiecho pulse sequences of the brain and surrounding structures were obtained without intravenous contrast. COMPARISON:  Head CT 08/28/2017 FINDINGS: BRAIN: Small, 6 mm focus of abnormal diffusion restriction within the dorsal aspect of the right pons. No other diffusion restriction abnormality. The midline structures are normal. Old bilateral cerebellar and left pontine infarcts, in addition to multiple old basal ganglia infarcts. Multifocal white matter hyperintensity, most commonly due to chronic ischemic microangiopathy. the CSF spaces are normal for age, with no hydrocephalus. Susceptibility-sensitive sequences show no chronic microhemorrhage or superficial siderosis. VASCULAR: Major intracranial arterial and venous sinus flow voids are preserved. SKULL AND UPPER CERVICAL SPINE: The visualized skull base, calvarium, upper cervical spine and extracranial soft tissues are normal. SINUSES/ORBITS: No fluid levels or advanced mucosal thickening. No mastoid or middle ear effusion. The orbits are normal. IMPRESSION: 1. Small acute infarct of  the right dorsal pons without hemorrhage or mass effect. 2. Multiple old basal ganglia and cerebellar infarcts. Electronically Signed   By: Deatra Robinson M.D.   On: 08/29/2017 05:29   Dg Chest Port 1 View  Result Date: 08/29/2017 CLINICAL DATA:  52 year old male with tingling left arm. Initial encounter. EXAM: PORTABLE CHEST 1 VIEW COMPARISON:  02/25/2016 chest x-ray. FINDINGS: Taking into account limitation of overlying structures, no pulmonary mass noted. No infiltrate, congestive heart failure or pneumothorax. Cardiomegaly. Tortuous aorta more so than on prior examination. Aortic dilation not excluded. Mild curvature thoracic spine. IMPRESSION: 1. Aorta appears more prominent than  on prior examination. This may be related to differences in technique and degree of inspiration. This can be assessed on follow-up two-view chest with better inspiration. 2. Cardiomegaly. 3.  No infiltrate or congestive heart failure. Electronically Signed   By: Lacy Duverney M.D.   On: 08/29/2017 07:34   Mr Maxine Glenn Head Wo Contrast  Result Date: 08/29/2017 CLINICAL DATA:  History of stroke, hypertension and hyperlipidemia. Smoking history. Left-sided numbness, tingling and weakness. Subcentimeter acute stroke in the right side of the pons shown by standard MR. EXAM: MRA HEAD WITHOUT CONTRAST TECHNIQUE: Angiographic images of the Circle of Willis were obtained using MRA technique without intravenous contrast. COMPARISON:  MRI same day.  MR angiography 02/24/2016. FINDINGS: Both internal carotid arteries are widely patent through the skull base and siphon regions. The anterior and middle cerebral vessels are patent without proximal stenosis, aneurysm or vascular malformation. There is some atherosclerotic narrowing and irregularity in the more distal MCA and ACA branches. Both vertebral arteries are patent to the basilar with the left being dominant. No basilar stenosis. Posterior circulation branch vessels are patent. Some atherosclerotic narrowing and irregularity of the more distal PCA branches. Compared to the study of last year, no change is appreciated. IMPRESSION: No large or medium vessel occlusion or correctable proximal stenosis. Atherosclerotic narrowing and irregularity of the more distal intracranial branch vessels as seen previously. Electronically Signed   By: Paulina Fusi M.D.   On: 08/29/2017 08:55    Microbiology: No results found for this or any previous visit (from the past 240 hour(s)).   Labs: Basic Metabolic Panel: Recent Labs  Lab 08/28/17 2317 08/28/17 2328 08/30/17 0454  NA 139 141 139  K 4.0 3.7 3.5  CL 106 104 103  CO2 25  --  23  GLUCOSE 124* 125* 104*  BUN 20 23* 14    CREATININE 1.91* 2.00* 1.64*  CALCIUM 9.2  --  9.7   Liver Function Tests: Recent Labs  Lab 08/28/17 2317  AST 25  ALT 20  ALKPHOS 79  BILITOT 0.5  PROT 6.7  ALBUMIN 3.9   No results for input(s): LIPASE, AMYLASE in the last 168 hours. No results for input(s): AMMONIA in the last 168 hours. CBC: Recent Labs  Lab 08/28/17 2317 08/28/17 2328  WBC 4.7  --   NEUTROABS 1.8  --   HGB 15.9 16.0  HCT 47.3 47.0  MCV 87.6  --   PLT 191  --    Cardiac Enzymes: No results for input(s): CKTOTAL, CKMB, CKMBINDEX, TROPONINI in the last 168 hours. BNP: BNP (last 3 results) No results for input(s): BNP in the last 8760 hours.  ProBNP (last 3 results) No results for input(s): PROBNP in the last 8760 hours.  CBG: No results for input(s): GLUCAP in the last 168 hours.     Signed:  Albertine Grates MD, PhD  [facility] 08/30/2017, 1:51 PM

## 2017-08-30 NOTE — Progress Notes (Signed)
Physical Therapy Treatment Patient Details Name: Thomas [name] MRN: [id number] DOB: November 14, 1965 Today's Date: 08/30/2017    History of Present Illness Thomas [name] is a 52 y.o. male who presented to the ED complaining of L sided tingling, weakness, and facial drooping. MRI significant for an acute infarct in the R pons. PMH of HTN, HLD, CVA in 10/2015, bipolar disorder, tobacco abuse.    PT Comments    Patient is progressing well toward PT goals. Pt tolerated session well and overall mod I with supervision for stair negotiation for safety. Pt reports he feels he is close to mobilizing at his baseline and with DGI score of 22/24. Wife present in room. Continue to progress as tolerated.    Follow Up Recommendations  No PT follow up     Equipment Recommendations  None recommended by PT    Recommendations for Other Services OT consult     Precautions / Restrictions Precautions Precautions: Fall;Other (comment) Precaution Comments: Permissive hypertension; 220/120  Restrictions Weight Bearing Restrictions: No    Mobility  Bed Mobility               General bed mobility comments: pt sitting in chair upon arrival  Transfers Overall transfer level: Independent                  Ambulation/Gait Ambulation/Gait assistance: Modified independent (Device/Increase time) Gait Distance (Feet): 300 Feet Assistive device: None Gait Pattern/deviations: Step-through pattern     General Gait Details: pt with grossly steady gait with a tendency to drift R/L slightly but no significant gait deviations noted   Stairs Stairs: Yes Stairs assistance: Supervision Stair Management: No rails;Alternating pattern;Forwards Number of Stairs: 3     Wheelchair Mobility    Modified Rankin (Stroke Patients Only)       Balance Overall balance assessment: Modified Independent                               Standardized Balance Assessment Standardized Balance  Assessment : Dynamic Gait Index   Dynamic Gait Index Level Surface: Normal Change in Gait Speed: Normal Gait with Horizontal Head Turns: Normal Gait with Vertical Head Turns: Normal Gait and Pivot Turn: Normal Step Over Obstacle: Mild Impairment Step Around Obstacles: Mild Impairment Steps: Normal Total Score: 22      Cognition Arousal/Alertness: Awake/alert Behavior During Therapy: WFL for tasks assessed/performed Overall Cognitive Status: Within Functional Limits for tasks assessed                                        Exercises      General Comments        Pertinent Vitals/Pain Pain Assessment: No/denies pain    Home Living                      Prior Function            PT Goals (current goals can now be found in the care plan section) Acute Rehab PT Goals PT Goal Formulation: With patient Time For Goal Achievement: 09/12/17 Potential to Achieve Goals: Good Progress towards PT goals: Progressing toward goals    Frequency    Min 4X/week      PT Plan Current plan remains appropriate    Co-evaluation  AM-PAC PT "6 Clicks" Daily Activity  Outcome Measure  Difficulty turning over in bed (including adjusting bedclothes, sheets and blankets)?: None Difficulty moving from lying on back to sitting on the side of the bed? : None Difficulty sitting down on and standing up from a chair with arms (e.g., wheelchair, bedside commode, etc,.)?: None Help needed moving to and from a bed to chair (including a wheelchair)?: None Help needed walking in hospital room?: None Help needed climbing 3-5 steps with a railing? : A Little 6 Click Score: 23    End of Session   Activity Tolerance: Patient tolerated treatment well Patient left: with call bell/phone within reach;in chair;with family/visitor present;Other (comment)(MD present) Nurse Communication: Mobility status PT Visit Diagnosis: Other abnormalities of gait and  mobility (R26.89);Unsteadiness on feet (R26.81);Muscle weakness (generalized) (M62.81);Dizziness and giddiness (R42);Other symptoms and signs involving the nervous system (R29.898);Hemiplegia and hemiparesis Hemiplegia - Right/Left: Left Hemiplegia - dominant/non-dominant: Non-dominant Hemiplegia - caused by: Cerebral infarction     Time: 1218-1231 PT Time Calculation (min) (ACUTE ONLY): 13 min  Charges:  $Gait Training: 8-22 mins                     Erline LevineKellyn Deyon Chizek, PTA Pager: [phone]     Carolynne EdouardKellyn R Tearah Saulsbury 08/30/2017, 1:21 PM

## 2017-09-04 ENCOUNTER — Other Ambulatory Visit: Payer: Self-pay

## 2017-09-04 ENCOUNTER — Ambulatory Visit (HOSPITAL_COMMUNITY)
Admission: EM | Admit: 2017-09-04 | Discharge: 2017-09-04 | Disposition: A | Discharge status: Home / Self Care | Payer: Medicaid Other | Attending: Family Medicine | Admitting: Family Medicine

## 2017-09-04 ENCOUNTER — Encounter (HOSPITAL_COMMUNITY): Payer: Self-pay | Admitting: Emergency Medicine

## 2017-09-04 DIAGNOSIS — K047 Periapical abscess without sinus: Secondary | ICD-10-CM | POA: Diagnosis not present

## 2017-09-04 DIAGNOSIS — K029 Dental caries, unspecified: Secondary | ICD-10-CM

## 2017-09-04 DIAGNOSIS — I10 Essential (primary) hypertension: Secondary | ICD-10-CM

## 2017-09-04 MED ORDER — CLONIDINE HCL 0.1 MG PO TABS
0.2000 mg | ORAL_TABLET | Freq: Once | ORAL | Status: AC
  Administered 2017-09-04: 0.2 mg via ORAL

## 2017-09-04 MED ORDER — CLONIDINE HCL 0.1 MG PO TABS
ORAL_TABLET | ORAL | Status: AC
  Filled 2017-09-04: qty 2

## 2017-09-04 MED ORDER — PENICILLIN V POTASSIUM 500 MG PO TABS: 500.0000 mg | ORAL_TABLET | Freq: Four times a day (QID) | ORAL | 0 refills | Status: DC

## 2017-09-04 MED ORDER — LISINOPRIL 20 MG PO TABS: 20.0000 mg | ORAL_TABLET | Freq: Every day | ORAL | 0 refills | Status: DC

## 2017-09-04 MED ORDER — CLOPIDOGREL BISULFATE 75 MG PO TABS: 75.0000 mg | ORAL_TABLET | Freq: Every day | ORAL | 0 refills | Status: DC

## 2017-09-04 MED ORDER — PENICILLIN V POTASSIUM 500 MG PO TABS: 500.0000 mg | ORAL_TABLET | Freq: Four times a day (QID) | ORAL | 0 refills | Status: AC

## 2017-09-04 NOTE — Discharge Instructions (Addendum)
It was nice meeting you!!  We are going to add on another Blood pressure medication.  You will take the Lisinopril 20 mg once a day along with your hydralazine 3 x a day.  Please follow up in the next week or so for recheck. You need to be monitoring your blood pressure at home. Keep you appointment with [facility].  Please come back sooner if your blood pressure remains critical high.  We will also give you some antibiotics for the dental infection.  You will need to see a dentist for further management.

## 2017-09-04 NOTE — ED Provider Notes (Signed)
[facility]    CSN: [id number] Arrival date & time: 09/04/17  0957     History   Chief Complaint Chief Complaint  Patient presents with  . Dental Pain    HPI Thomas [name] is a 52 y.o. male.   Patient is a 52 year old male with past medical history of bipolar, CKD, hypertension, stroke. He resents today with right upper dental pain and facial swelling.  This started yesterday and has gotten progressively worse.  He has not taken anything for pain.  He denies any fever, chills, body aches.  He does not have a dentist and admits to having dental issues.  He does not have any trouble opening his mouth or swallowing.   Patient also presents with increased blood pressure.  He did not come in to be seen for this but we noticed during triage that his blood pressure was 206/94.  The blood pressure was repeated twice with the same readings.  Patient was just discharged from the hospital 1 week ago with CVA.  He was started on hydralazine which he reports he is only taken 1 time a day.  He has not been taking his Plavix because he cannot afford it.  He is very confused about what meds he is supposed to be taking and when.  He reports that his wife helps him with his medications.  He denies any dizziness, headache, vision changes, extremity weakness, numbness, tingling.  Denies any chest pain, shortness of breath.  He also is not taking his psychiatric meds.   ROS per HPI      Past Medical History:  Diagnosis Date  . Abnormal EKG   . Bipolar disorder (HCC)   . Cardiomegaly   . CKD (chronic kidney disease), stage II   . Family history of heart disease   . H/O medication noncompliance   . Hyperlipidemia   . Hypertension   . Stroke (HCC) 11/07/2015  . Tobacco abuse     Patient Active Problem List   Diagnosis Date Noted  . 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 Stroke (HCC) 08/29/2017  . CKD (chronic kidney disease), stage II   . Bipolar disorder (HCC)   . Altered  mental status 02/24/2016  . Urinary tract infection without hematuria 02/24/2016  . Acute encephalopathy 02/24/2016  . Chest pain 02/24/2016  . History of stroke   . Blurry vision, right eye 11/08/2015  . Acute cerebrovascular accident (CVA) (HCC) 11/08/2015  . Acute kidney injury superimposed on CKD (HCC) 11/07/2015  . Hyperlipidemia 01/26/2014  . TOBACCO ABUSE 04/19/2008  . HYPERTENSION, BENIGN ESSENTIAL 01/22/2005    Past Surgical History:  Procedure Laterality Date  . ANKLE FRACTURE SURGERY    . NM MYOCAR PERF WALL MOTION  06/28/2011   protocol Bruce, normal perfusion nin all regions, post stress EF 57%,, exercise cap  . TRANSTHORACIC ECHOCARDIOGRAM  06/28/2011   EF=55%, Proximal septal thickening, borderline LA enlargement, boarderline aortic root dialation       Home Medications    Prior to Admission medications   Medication Sig Start Date End Date Taking? Authorizing Provider  aspirin EC 81 MG EC tablet Take 1 tablet (81 mg total) by mouth daily for 21 days. 08/31/17 09/21/17 Yes Albertine Grates, MD  atorvastatin (LIPITOR) 40 MG tablet Take 1 tablet (40 mg total) by mouth daily at 6 PM. 08/30/17  Yes Albertine Grates, MD  buPROPion [facility] SR) 100 MG 12 hr tablet Take 100 mg by mouth 2 (two) times daily.  Yes [provider]  hydrALAZINE (APRESOLINE) 50 MG tablet Take 1 tablet (50 mg total) by mouth every 8 (eight) hours. 08/30/17  Yes Albertine GratesXu, Fang, MD  paliperidone (INVEGA) 6 MG 24 hr tablet Take 6 mg by mouth at bedtime.   Yes [provider]  clopidogrel (PLAVIX) 75 MG tablet Take 1 tablet (75 mg total) by mouth daily. 09/04/17   Dahlia ByesBast, Josslynn Mentzer A, NP  lisinopril (PRINIVIL,ZESTRIL) 20 MG tablet Take 1 tablet (20 mg total) by mouth daily. 09/04/17   Dahlia ByesBast, Dionel Archey A, NP  penicillin v potassium (VEETID) 500 MG tablet Take 1 tablet (500 mg total) by mouth 4 (four) times daily for 10 days. 09/04/17 09/14/17  Janace ArisBast, Xan Ingraham A, NP    Family History Family History  Problem Relation  Age of Onset  . Hypertension Mother   . Diabetes Mother   . Stroke Mother   . Hypertension Father   . Diabetes Father   . Stroke Father   . Hypertension Sister   . Stroke Sister   . Diabetes Sister   . Hyperlipidemia Maternal Grandmother   . Diabetes Maternal Grandmother   . Asthma Daughter   . Asthma Son     Social History Social History   Tobacco Use  . Smoking status: Former Smoker    Packs/day: 0.25    Years: 15.00    Pack years: 3.75    Types: Cigarettes    Last attempt to quit: 11/06/2015    Years since quitting: 1.8  . Smokeless tobacco: Never Used  . Tobacco comment: quit in Juy 2015  Substance Use Topics  . Alcohol use: Yes    Alcohol/week: 12.0 standard drinks    Types: 12 Cans of beer per week    Comment: 12 pack on weekends   . Drug use: No     Allergies   Patient has no known allergies.   Review of Systems Review of Systems   Physical Exam Triage Vital Signs ED Triage Vitals  Enc Vitals Group     BP 09/04/17 1005 (!) 206/94     Pulse Rate 09/04/17 1005 68     Resp 09/04/17 1005 16     Temp 09/04/17 1005 98 F (36.7 C)     Temp Source 09/04/17 1005 Oral     SpO2 09/04/17 1005 99 %     Weight --      Height --      Head Circumference --      Peak Flow --      Pain Score 09/04/17 1009 6     Pain Loc --      Pain Edu? --      Excl. in GC? --    No data found.  Updated Vital Signs BP (!) 206/94 (BP Location: Left Arm)   Pulse 68   Temp 98 F (36.7 C) (Oral)   Resp 16   SpO2 99%   Visual Acuity Right Eye Distance:   Left Eye Distance:   Bilateral Distance:    Right Eye Near:   Left Eye Near:    Bilateral Near:     Physical Exam  Constitutional: He is oriented to person, place, and time. He appears well-developed and well-nourished.  HENT:  Head: Normocephalic and atraumatic.  Nose: Nose normal.  Erythema, swelling to buccal mucosa and right upper gum area.  Possible abscess where a tooth was previously extracted.   Multiple dental caries.  Tender to palpation of gum area.    Eyes:  Pupils are equal, round, and reactive to light. EOM are normal.  Neck: Normal range of motion. Neck supple.  Cardiovascular: Normal rate, regular rhythm and normal heart sounds.  Pulmonary/Chest: Effort normal and breath sounds normal.  Abdominal: Soft.  Musculoskeletal: Normal range of motion.  Neurological: He is alert and oriented to person, place, and time. No sensory deficit.  No focal neuro deficits.  Cranial nerves intact.  No sensory deficits.  No arm drift, slurred speech, facial droop.  Strength 5/5 in all extremities.   Skin: Skin is warm. Capillary refill takes less than 2 seconds.  Psychiatric: He has a normal mood and affect.  Nursing note and vitals reviewed.    UC Treatments / Results  Labs (all labs ordered are listed, but only abnormal results are displayed) Labs Reviewed - No data to display  EKG None  Radiology No results found.  Procedures Procedures (including critical care time)  Medications Ordered in UC Medications  cloNIDine (CATAPRES) tablet 0.2 mg (0.2 mg Oral Given 09/04/17 1032)    Initial Impression / Assessment and Plan / UC Course  I have reviewed the triage vital signs and the nursing notes.  Pertinent labs & imaging results that were available during my care of the patient were reviewed by me and considered in my medical decision making (see chart for details).     We will treat patient for dental infection with penicillin. He can take ibuprofen for pain.   More concerned about elevated blood pressure, especially based on patient's history and recent discharge 1 week ago from the hospital for CVA.  Patient given 0.2 mg clonidine for blood pressure in clinic.  There was a slight decrease in systolic but diastolic was still elevated.  Reading was 197/120.  He is having no focal neuro deficits on physical exam and is asymptomatic.  Will have patient go home and take  hydralazine and then take another dose this evening as prescribed.    Instructed to wake up and take a dose of hydralazine and then come in a few hours later for recheck of blood pressure.  If still elevated at that point will have his start lisinopril daily.  If he develops any worsening symptoms between now and then he needs to go to the ER. He will be monitoring his blood pressure at home and his wife is there to help him.   Pt was instructed multiple times of the correct regimen and dose of the hydralazine. A lot of time was spent with this time teaching.  Prescriptions were printed out and instructed the  [facility]  Patient agreeable to plan.  Final Clinical Impressions(s) / UC Diagnoses   Final diagnoses:  None     Discharge Instructions     It was nice meeting you!!  We are going to add on another Blood pressure medication.  You will take the Lisinopril 20 mg once a day along with your hydralazine 3 x a day.  Please follow up in the next week or so for recheck. You need to be monitoring your blood pressure at home. Keep you appointment with [facility].  Please come back sooner if your blood pressure remains critical high.  We will also give you some antibiotics for the dental infection.  You will need to see a dentist for further management.      ED Prescriptions    Medication Sig Dispense Auth. Provider   lisinopril (PRINIVIL,ZESTRIL)  20 MG tablet  (Status: Discontinued) Take 1 tablet (20 mg total) by mouth daily. 30 tablet Rachella Basden A, NP   penicillin v potassium (VEETID) 500 MG tablet  (Status: Discontinued) Take 1 tablet (500 mg total) by mouth 4 (four) times daily for 10 days. 40 tablet Moritz Lever A, NP   clopidogrel (PLAVIX) 75 MG tablet Take 1 tablet (75 mg total) by mouth daily. 30 tablet Annie Saephan A, NP   penicillin v potassium (VEETID) 500 MG tablet Take 1 tablet (500 mg total) by mouth 4 (four)  times daily for 10 days. 40 tablet Dara Beidleman A, NP   lisinopril (PRINIVIL,ZESTRIL) 20 MG tablet Take 1 tablet (20 mg total) by mouth daily. 30 tablet Dahlia Byes A, NP     Controlled Substance Prescriptions [place] Controlled Substance Registry consulted? Not Applicable   Janace Aris, NP 09/04/17 1457

## 2017-09-04 NOTE — ED Triage Notes (Addendum)
Pt complains of right upper gum pain that started last night.  Pt has elevated BP here today.  After discussing his medications with him, it seems he is unsure of what he is taking and what he should be taking.  Some mention of not being able to afford his medications noted.  Pt's wife read the three bottles he states he is taking his medications from.  Two were Hydralazine and one was Lipitor. So pt may be taking double dose oh Hydralazine, but appears he is not taking his Amlodipine, Plavix, or Aspirin.

## 2017-10-01 NOTE — Progress Notes (Deleted)
Thomas [name] [address] [place]. [place] [address] [phone]       OFFICE FOLLOW UP NOTE  [name] Date of Birth:  1965-09-08 Medical Record Number:  [id number]   Reason for Referral:  hospital stroke follow up  CHIEF COMPLAINT:  No chief complaint on file.   HPI: [name] is being seen today for initial visit in the office for right dorsal pons infarct secondary to small vessel disease on 08/28/2017. History obtained from *** and chart review. Reviewed all radiology images and labs personally.  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  CT head reviewed and was negative for acute abnormality.  MRI head reviewed and showed small acute infarct of the right dorsal pons.  MRA of the brain was negative for large or medium vessel occlusion.  Carotid Doppler showed bilateral ICA stenosis of 1 to 39% with vertebral arteries antegrade flow.  2D echo showed an EF of 60 to 65%.  LDL 88 and recommended starting Lipitor 40 mg daily.  Patient was found to be in hypertensive emergency with BP on admission 187/133.  Stabilize during admission with resuming home antihypertensives and recommended tight BP control with close PCP follow-up.  Recommended DAPT with aspirin 81 mg and Plavix for 3 weeks then Plavix alone.  Tobacco cessation counseling provided.  Patient was discharged home in stable condition.   ROS:   14 system review of systems performed and negative with exception of ***  PMH:  Past Medical History:  Diagnosis Date  . Abnormal EKG   . Bipolar disorder (HCC)   . Cardiomegaly   . CKD (chronic kidney disease), stage II   . Family history of heart disease   . H/O  medication noncompliance   . Hyperlipidemia   . Hypertension   . Stroke (HCC) 11/07/2015  . Tobacco abuse     PSH:  Past Surgical History:  Procedure Laterality Date  . ANKLE FRACTURE SURGERY    . NM MYOCAR PERF WALL MOTION  06/28/2011   protocol Bruce, normal perfusion nin all regions, post stress EF 57%,, exercise cap  . TRANSTHORACIC ECHOCARDIOGRAM  06/28/2011   EF=55%, Proximal septal thickening, borderline LA enlargement, boarderline aortic root dialation    Social History:  Social History   Socioeconomic History  . Marital status: Married    Spouse name: Not on file  . Number of children: Not on file  . Years of education: Not on file  . Highest education level: Not on file  Occupational History  . Not on file  Social Needs  . Financial resource strain: Not on file  . Food insecurity:    Worry: Not on file    Inability: Not on file  . Transportation needs:    Medical: Not on file    Non-medical: Not on file  Tobacco Use  . Smoking status: Former Smoker    Packs/day: 0.25    Years: 15.00    Pack years: 3.75    Types: Cigarettes    Last attempt to quit: 11/06/2015    Years since quitting: 1.9  . Smokeless tobacco: Never Used  . Tobacco comment: quit in Juy 2015  Substance and Sexual Activity  . Alcohol use: Yes  Alcohol/week: 12.0 standard drinks    Types: 12 Cans of beer per week    Comment: 12 pack on weekends   . Drug use: No  . Sexual activity: Not on file  Lifestyle  . Physical activity:    Days per week: Not on file    Minutes per session: Not on file  . Stress: Not on file  Relationships  . Social connections:    Talks on phone: Not on file    Gets together: Not on file    Attends religious service: Not on file    Active member of club or organization: Not on file    Attends meetings of clubs or organizations: Not on file    Relationship status: Not on file  . Intimate partner violence:    Fear of current or ex partner: Not on  file    Emotionally abused: Not on file    Physically abused: Not on file    Forced sexual activity: Not on file  Other Topics Concern  . Not on file  Social History Narrative  . Not on file    Family History:  Family History  Problem Relation Age of Onset  . Hypertension Mother   . Diabetes Mother   . Stroke Mother   . Hypertension Father   . Diabetes Father   . Stroke Father   . Hypertension Sister   . Stroke Sister   . Diabetes Sister   . Hyperlipidemia Maternal Grandmother   . Diabetes Maternal Grandmother   . Asthma Daughter   . Asthma Son     Medications:   Current Outpatient Medications on File Prior to Visit  Medication Sig Dispense Refill  . atorvastatin (LIPITOR) 40 MG tablet Take 1 tablet (40 mg total) by mouth daily at 6 PM. 30 tablet 0  . buPROPion (WELLBUTRIN SR) 100 MG 12 hr tablet Take 100 mg by mouth 2 (two) times daily.    . clopidogrel (PLAVIX) 75 MG tablet Take 1 tablet (75 mg total) by mouth daily. 30 tablet 0  . hydrALAZINE (APRESOLINE) 50 MG tablet Take 1 tablet (50 mg total) by mouth every 8 (eight) hours. 90 tablet 0  . lisinopril (PRINIVIL,ZESTRIL) 20 MG tablet Take 1 tablet (20 mg total) by mouth daily. 30 tablet 0  . paliperidone (INVEGA) 6 MG 24 hr tablet Take 6 mg by mouth at bedtime.     No current facility-administered medications on file prior to visit.     Allergies:  No Known Allergies   Physical Exam  There were no vitals filed for this visit. There is no height or weight on file to calculate BMI. No exam data present  General: well developed, well nourished, seated, in no evident distress Head: head normocephalic and atraumatic.   Neck: supple with no carotid or supraclavicular bruits Cardiovascular: regular rate and rhythm, no murmurs Musculoskeletal: no deformity Skin:  no rash/petichiae Vascular:  Normal pulses all extremities  Neurologic Exam Mental Status: Awake and fully alert. Oriented to place and time. Recent and  remote memory intact. Attention span, concentration and fund of knowledge appropriate. Mood and affect appropriate.  Cranial Nerves: Fundoscopic exam reveals sharp disc margins. Pupils equal, briskly reactive to light. Extraocular movements full without nystagmus. Visual fields full to confrontation. Hearing intact. Facial sensation intact. Face, tongue, palate moves normally and symmetrically.  Motor: Normal bulk and tone. Normal strength in all tested extremity muscles. Sensory.: intact to touch , pinprick , position and vibratory sensation.  Coordination:  Rapid alternating movements normal in all extremities. Finger-to-nose and heel-to-shin performed accurately bilaterally. Gait and Station: Arises from chair without difficulty. Stance is normal. Gait demonstrates normal stride length and balance . Able to heel, toe and tandem walk without difficulty.  Reflexes: 1+ and symmetric. Toes downgoing.    NIHSS  *** Modified Rankin  ***   Diagnostic Data (Labs, Imaging, Testing)  Ct Head Wo Contrast 08/28/2017 IMPRESSION:No acute intracranial abnormality. Chronic stable bilateral small cerebellar infarcts and probable right basal ganglial lacunar infarcts.   Mr Brain Wo Contrast 08/29/2017 IMPRESSION: 1. Small acute infarct of the right dorsal pons without hemorrhage or mass effect.  2. Multiple old basal ganglia and cerebellar infarcts.  Mr Maxine Glenn Head Wo Contrast 08/29/2017 IMPRESSION: No large or medium vessel occlusion or correctable proximal stenosis. Atherosclerotic narrowing and irregularity of the more distal intracranial branch vessels as seen previously.   Dg Chest Port 1 View 08/29/2017 IMPRESSION:  1. Aorta appears more prominent than on prior examination. This may be related to differences in technique and degree of inspiration. This can be assessed on follow-up two-view chest with better inspiration.  2. Cardiomegaly.  3. No infiltrate or congestive heart failure.  2D echo  08/29/17 Study Conclusions - Left ventricle: The cavity size was normal. Wall thickness was increased in a pattern of moderate LVH. Systolic function was normal. The estimated ejection fraction was in the range of 60% to 65%. Wall motion was normal; there were no regional wall motion abnormalities. Doppler parameters are consistent with abnormal left ventricular relaxation (grade 1 diastolic dysfunction). - Aortic valve: A bicuspid morphology cannot be excluded; normal thickness, mildly calcified leaflets. There was mild regurgitation. - Left atrium: The atrium was severely dilated.  Carotid Dopplers  08/29/17 Right and LEft Carotid: Velocities in the right ICA are consistent with a 1-39% stenosis. Vertebrals: Bilateral vertebral arteries demonstrate antegrade flow.  EKG  NSR -  Possible eft atrial enlargement. For complete results please see formal report.    ASSESSMENT: CYREE CHUONG is a 52 y.o. year old male here with right dorsal pons infarct on 08/28/2017 secondary to small vessel disease. Vascular risk factors include prior strokes, HTN, HLD and tobacco use.     PLAN: -Continue {anticoagulants:31417}  and ***  for secondary stroke prevention -F/u with PCP regarding your *** management -continue to monitor BP at home -advised to continue to stay active and maintain a healthy diet -Maintain strict control of hypertension with blood pressure goal below 130/90, diabetes with hemoglobin A1c goal below 6.5% and cholesterol with LDL cholesterol (bad cholesterol) goal below 70 mg/dL. I also advised the patient to eat a healthy diet with plenty of whole grains, cereals, fruits and vegetables, exercise regularly and maintain ideal body weight.  Follow up in *** or call earlier if needed   Greater than 50% of time during this 25 minute visit was spent on counseling,explanation of diagnosis of ***, reviewing risk factor management of ***, planning of further management,  discussion with patient and family and coordination of care    George Hugh, [facility]  [name] [address] [place], [place] [address]  Phone [phone] Fax [phone] Note: This document was prepared with digital dictation and possible smart phrase technology. Any transcriptional errors that result from this process are unintentional.

## 2017-10-02 ENCOUNTER — Ambulatory Visit: Payer: Medicaid Other | Admitting: Adult Health

## 2017-10-02 ENCOUNTER — Telehealth: Payer: Self-pay

## 2017-10-02 NOTE — Telephone Encounter (Signed)
Shanda Bumps, NP is out of the office today. I called pt to advise him of this and r/s his appt. No answer, left a message asking him to call me back.

## 2017-10-11 NOTE — Progress Notes (Deleted)
[facility] [address] [place] [id number] [phone]       OFFICE FOLLOW UP NOTE  [name] Date of Birth:  09/12/1965 Medical Record Number:  [id number]   Reason for Referral:  hospital stroke follow up  CHIEF COMPLAINT:  No chief complaint on file.   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 History obtained from *** and chart review. Reviewed all radiology images and labs personally.  Thomas [name] is a 52 year old male with past medical history of bipolar, CKD, HTN, HLD, smoker, cardiomyopathy and history of stroke in 2016 who presented with left-sided weakness, numbness and difficulty walking.  CT head reviewed and was negative for acute abnormality.  MRI brain reviewed and showed right dorsal pontine small infarcts.  MRA unremarkable.  2D echo showed an EF of 60 to 65%.  Carotid Doppler unremarkable.  LDL 88 and A1c 5.1.  UDS positive for THC.  Elevated creatinine at 2.00.  BP elevated during admission at 187/133 and after first 24 hours, home BP meds were resumed and recommend tight BP control due to recent stroke.  Determined that infarct consistent with small vessel disease.  As patient active smoker, recommended to quit smoking and counseling was provided during admission.  Recommend DAPT for 3 weeks and then Plavix alone along with continuation of Lipitor 40 mg daily.  Patient was discharged home without therapy needs.   ROS:   14 system review of systems performed and negative with exception of ***  PMH:  Past Medical History:  Diagnosis Date  . Abnormal EKG   . Bipolar disorder (HCC)   . Cardiomegaly   . CKD (chronic kidney disease), stage II   . Family history of heart disease   . H/O medication noncompliance   . Hyperlipidemia   . Hypertension   . Stroke (HCC) 11/07/2015  . Tobacco abuse     PSH:  Past Surgical History:  Procedure Laterality Date    . ANKLE FRACTURE SURGERY    . NM MYOCAR PERF WALL MOTION  06/28/2011   protocol Bruce, normal perfusion nin all regions, post stress EF 57%,, exercise cap 13METS  . TRANSTHORACIC ECHOCARDIOGRAM  06/28/2011   EF=55%, Proximal septal thickening, borderline LA enlargement, boarderline aortic root dialation    Social History:  Social History   Socioeconomic History  . Marital status: Married    Spouse name: Not on file  . Number of children: Not on file  . Years of education: Not on file  . Highest education level: Not on file  Occupational History  . Not on file  Social Needs  . Financial resource strain: Not on file  . Food insecurity:    Worry: Not on file    Inability: Not on file  . Transportation needs:    Medical: Not on file    Non-medical: Not on file  Tobacco Use  . Smoking status: Former Smoker    Packs/day: 0.25    Years: 15.00    Pack years: 3.75    Types: Cigarettes    Last attempt to quit: 11/06/2015    Years since quitting: 1.9  . Smokeless tobacco: Never Used  . Tobacco comment: quit in Juy 2015  Substance and Sexual Activity  . Alcohol use: Yes    Alcohol/week: 12.0 standard drinks    Types: 12 Cans of beer per week    Comment: 12 pack on weekends   .  Drug use: No  . Sexual activity: Not on file  Lifestyle  . Physical activity:    Days per week: Not on file    Minutes per session: Not on file  . Stress: Not on file  Relationships  . Social connections:    Talks on phone: Not on file    Gets together: Not on file    Attends religious service: Not on file    Active member of club or organization: Not on file    Attends meetings of clubs or organizations: Not on file    Relationship status: Not on file  . Intimate partner violence:    Fear of current or ex partner: Not on file    Emotionally abused: Not on file    Physically abused: Not on file    Forced sexual activity: Not on file  Other Topics Concern  . Not on file  Social History  Narrative  . Not on file    Family History:  Family History  Problem Relation Age of Onset  . Hypertension Mother   . Diabetes Mother   . Stroke Mother   . Hypertension Father   . Diabetes Father   . Stroke Father   . Hypertension Sister   . Stroke Sister   . Diabetes Sister   . Hyperlipidemia Maternal Grandmother   . Diabetes Maternal Grandmother   . Asthma Daughter   . Asthma Son     Medications:   Current Outpatient Medications on File Prior to Visit  Medication Sig Dispense Refill  . atorvastatin (LIPITOR) 40 MG tablet Take 1 tablet (40 mg total) by mouth daily at 6 PM. 30 tablet 0  . buPROPion (WELLBUTRIN SR) 100 MG 12 hr tablet Take 100 mg by mouth 2 (two) times daily.    . clopidogrel (PLAVIX) 75 MG tablet Take 1 tablet (75 mg total) by mouth daily. 30 tablet 0  . hydrALAZINE (APRESOLINE) 50 MG tablet Take 1 tablet (50 mg total) by mouth every 8 (eight) hours. 90 tablet 0  . lisinopril (PRINIVIL,ZESTRIL) 20 MG tablet Take 1 tablet (20 mg total) by mouth daily. 30 tablet 0  . paliperidone (INVEGA) 6 MG 24 hr tablet Take 6 mg by mouth at bedtime.     No current facility-administered medications on file prior to visit.     Allergies:  No Known Allergies   Physical Exam  There were no vitals filed for this visit. There is no height or weight on file to calculate BMI. No exam data present  General: well developed, well nourished, seated, in no evident distress Head: head normocephalic and atraumatic.   Neck: supple with no carotid or supraclavicular bruits Cardiovascular: regular rate and rhythm, no murmurs Musculoskeletal: no deformity Skin:  no rash/petichiae Vascular:  Normal pulses all extremities  Neurologic Exam Mental Status: Awake and fully alert. Oriented to place and time. Recent and remote memory intact. Attention span, concentration and fund of knowledge appropriate. Mood and affect appropriate.  Cranial Nerves: Fundoscopic exam reveals sharp disc  margins. Pupils equal, briskly reactive to light. Extraocular movements full without nystagmus. Visual fields full to confrontation. Hearing intact. Facial sensation intact. Face, tongue, palate moves normally and symmetrically.  Motor: Normal bulk and tone. Normal strength in all tested extremity muscles. Sensory.: intact to touch , pinprick , position and vibratory sensation.  Coordination: Rapid alternating movements normal in all extremities. Finger-to-nose and heel-to-shin performed accurately bilaterally. Gait and Station: Arises from chair without difficulty. Stance is normal. Gait  demonstrates normal stride length and balance . Able to heel, toe and tandem walk without difficulty.  Reflexes: 1+ and symmetric. Toes downgoing.    NIHSS  *** Modified Rankin  ***    Diagnostic Data (Labs, Imaging, Testing)  Ct Head Wo Contrast 08/28/2017 IMPRESSION:No acute intracranial abnormality. Chronic stable bilateral small cerebellar infarcts and probable right basal ganglial lacunar infarcts.   Mr Brain Wo Contrast 08/29/2017 IMPRESSION: 1. Small acute infarct of the right dorsal pons without hemorrhage or mass effect.  2. Multiple old basal ganglia and cerebellar infarcts.  Mr Maxine Glenn Head Wo Contrast 08/29/2017 IMPRESSION: No large or medium vessel occlusion or correctable proximal stenosis. Atherosclerotic narrowing and irregularity of the more distal intracranial branch vessels as seen previously.   Dg Chest Port 1 View 08/29/2017 IMPRESSION:  1. Aorta appears more prominent than on prior examination. This may be related to differences in technique and degree of inspiration. This can be assessed on follow-up two-view chest with better inspiration.  2. Cardiomegaly.  3. No infiltrate or congestive heart failure.  2D echo 08/29/17 Study Conclusions - Left ventricle: The cavity size was normal. Wall thickness was increased in a pattern of moderate LVH. Systolic function was normal. The  estimated ejection fraction was in the range of 60% to 65%. Wall motion was normal; there were no regional wall motion abnormalities. Doppler parameters are consistent with abnormal left ventricular relaxation (grade 1 diastolic dysfunction). - Aortic valve: A bicuspid morphology cannot be excluded; normal thickness, mildly calcified leaflets. There was mild regurgitation. - Left atrium: The atrium was severely dilated.  Carotid Dopplers  08/29/17 Right and LEft Carotid: Velocities in the right ICA are consistent with a 1-39% stenosis. Vertebrals: Bilateral vertebral arteries demonstrate antegrade flow.  EKG  NSR -  Possible eft atrial enlargement. For complete results please see formal report.     ASSESSMENT: Thomas [name] is a 52 y.o. 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 Vascular risk factors include uncontrolled HTN, HLD, prior stroke, medication noncompliance and tobacco use.     PLAN: -Continue {anticoagulants:31417}  and ***  for secondary stroke prevention -F/u with PCP regarding your *** management -continue to monitor BP at home -advised to continue to stay active and maintain a healthy diet -Maintain strict control of hypertension with blood pressure goal below 130/90, diabetes with hemoglobin A1c goal below 6.5% and cholesterol with LDL cholesterol (bad cholesterol) goal below 70 mg/dL. I also advised the patient to eat a healthy diet with plenty of whole grains, cereals, fruits and vegetables, exercise regularly and maintain ideal body weight.  Follow up in *** or call earlier if needed   Greater than 50% of time during this 25 minute visit was spent on counseling,explanation of diagnosis of ***, reviewing risk factor management of ***, planning of further management, discussion with patient and family and coordination of care    George Hugh, [facility]  [facility] [address]  [address] [place], [place] [address]  Phone [phone] Fax [phone] Note: This document was prepared with digital dictation and possible smart phrase technology. Any transcriptional errors that result from this process are unintentional.

## 2017-10-14 ENCOUNTER — Ambulatory Visit: Payer: Medicaid Other | Admitting: Adult Health

## 2017-10-14 ENCOUNTER — Telehealth: Payer: Self-pay

## 2017-10-14 NOTE — Telephone Encounter (Signed)
PT no show for appt today. 

## 2017-10-16 ENCOUNTER — Encounter: Payer: Self-pay | Admitting: Adult Health

## 2017-11-15 ENCOUNTER — Telehealth: Payer: Self-pay

## 2017-11-15 NOTE — Telephone Encounter (Signed)
Spoke to patient's daughter Glenis Smoker patient wants to change care to Dr.Acharya.Message sent to [facility] and to Dr.Acharya for approval.

## 2017-11-21 NOTE — Telephone Encounter (Signed)
Fine with me

## 2017-11-22 NOTE — Telephone Encounter (Signed)
Provide changed OK'ed. Scheduled with Jacques Navy, MD 12/11/17

## 2017-11-26 NOTE — Telephone Encounter (Signed)
Patient's daughter scheduled appointment for father with Dr.Acharya 12/11/17 at 2:40 pm.

## 2017-12-11 ENCOUNTER — Encounter: Payer: Self-pay | Admitting: Internal Medicine

## 2017-12-11 ENCOUNTER — Ambulatory Visit (INDEPENDENT_AMBULATORY_CARE_PROVIDER_SITE_OTHER): Payer: Medicaid Other | Admitting: Internal Medicine

## 2017-12-11 VITALS — BP 162/100 | HR 60 | Ht 75.0 in | Wt 204.4 lb

## 2017-12-11 DIAGNOSIS — N182 Chronic kidney disease, stage 2 (mild): Secondary | ICD-10-CM | POA: Diagnosis not present

## 2017-12-11 DIAGNOSIS — I639 Cerebral infarction, unspecified: Secondary | ICD-10-CM

## 2017-12-11 DIAGNOSIS — I10 Essential (primary) hypertension: Secondary | ICD-10-CM | POA: Diagnosis not present

## 2017-12-11 NOTE — Patient Instructions (Addendum)
Medication Instructions:  Your physician has recommended you make the following change in your medication:  STOP Lisinopril  If you need a refill on your cardiac medications before your next appointment, please call your pharmacy.   Lab work: None ordered If you have labs (blood work) drawn today and your tests are completely normal, you will receive your results only by: Marland Kitchen. MyChart Message (if you have MyChart) OR . A paper copy in the mail If you have any lab test that is abnormal or we need to change your treatment, we will call you to review the results.  Testing/Procedures: None ordered  Follow-Up: At [facility], you and your health needs are our priority.  As part of our continuing mission to provide you with exceptional heart care, we have created designated Provider Care Teams.  These Care Teams include your primary Cardiologist (physician) and Advanced Practice Providers (APPs -  Physician Assistants and Nurse Practitioners) who all work together to provide you with the care you need, when you need it. You will need a follow up appointment in 6 weeks.    You have been referred to Nephrology [facility] Dr.Jay Allena KatzPatel  Any Other Special Instructions Will Be Listed Below (If Applicable). Please call or send in an updated list of your updated medication list

## 2017-12-17 ENCOUNTER — Telehealth: Payer: Self-pay

## 2017-12-17 MED ORDER — DOXAZOSIN MESYLATE 2 MG PO TABS: 2.0000 mg | ORAL_TABLET | Freq: Every day | ORAL | 5 refills | Status: DC

## 2017-12-17 MED ORDER — HYDRALAZINE HCL 50 MG PO TABS: ORAL_TABLET | ORAL | 0 refills | Status: DC

## 2017-12-17 MED ORDER — CARVEDILOL 6.25 MG PO TABS: 6.2500 mg | ORAL_TABLET | Freq: Two times a day (BID) | ORAL | 5 refills | Status: DC

## 2017-12-17 NOTE — Telephone Encounter (Addendum)
Per Dr.Acharya (Dr.Acharya will update her note to reflect) Pt is to start  Carvedilol 6.25mg  twice daily Doxazosin 2mg  daily Rx sent to the pt pharmacy.  Change Hydralazin to 50mg  daily as needed for systolic BP >160. Pt needs to monitor his BP daily and keep a BP log.  Pt daughter aware of Dr.Acharya's recommendations.

## 2017-12-17 NOTE — Progress Notes (Signed)
Cardiology Office Note:    Date:  12/17/2017   ID:  Thomas [name], DOB 10/19/1965, MRN [id number]  PCP:  Fleet Contras, MD  Cardiologist:  Parke Poisson, MD  Electrophysiologist:  None   Referring MD: Fleet Contras, MD   Hypertension and stroke  History of Present Illness:    Thomas [name] is a 52 y.o. male with a hx of severe hypertension, hyperlipidemia, tobacco use, bipolar disorder, and medication inconsistency.  He presents today at the request of his primary care physician for further management of hypertension and cardiovascular risk factors.  He had a recent hospital admission in early August 2019 for left-sided numbness, tingling, and weakness, and an MRI of the brain confirmed acute stroke.  With his presentation he experienced blurred vision and generalized weakness on his left side.  His wife also endorsed slurred speech.  Per neurology there was a pontine infarct likely secondary to small vessel disease contributed to by uncontrolled hypertension, medication consistency, and tobacco use.  He had a previous history of a right cerebellar infarct in October 2017.  He was dismissed on aspirin and Plavix, with a plan for indefinite Plavix use, as well as initiation of statin therapy and adequate blood pressure control.  His medication therapy has been convoluted.  His daughter Glenis Smoker is a CMA in our practice, and has verified his medication prescriptions that he has at home.  These include hydralazine 50 mg 3 times daily, clopidogrel 75 mg daily, and atorvastatin 40 mg daily.  Per her report he has not been taking these medications regularly.  He reports intolerance to lisinopril due to facial swelling.  This is concerning for angioedema and should be avoided.  He also describes being taken off of amlodipine, which may be due to leg swelling.  While this is benign, we can consider alternate agents.  It is unclear why other medications have been started and stopped for  hypertension.  He does have abnormal renal function and does not follow with a nephrologist for CKD.  With his elevated blood pressure he denies significant chest pain or shortness of breath.  He currently denies palpitations.  He denies PND, orthopnea.  He does endorse some mild leg swelling that occurred while on amlodipine.  His blood pressure is significantly elevated today as well and is 162/100 mmHg with a pulse of 60 bpm.  Past Medical History:  Diagnosis Date  . Abnormal EKG   . Bipolar disorder (HCC)   . Cardiomegaly   . CKD (chronic kidney disease), stage II   . Family history of heart disease   . H/O medication noncompliance   . Hyperlipidemia   . Hypertension   . Stroke (HCC) 11/07/2015  . Tobacco abuse     Past Surgical History:  Procedure Laterality Date  . ANKLE FRACTURE SURGERY    . NM MYOCAR PERF WALL MOTION  06/28/2011   protocol Bruce, normal perfusion nin all regions, post stress EF 57%,, exercise cap  . TRANSTHORACIC ECHOCARDIOGRAM  06/28/2011   EF=55%, Proximal septal thickening, borderline LA enlargement, boarderline aortic root dialation    Current Medications: Current Meds  Medication Sig  . atorvastatin (LIPITOR) 40 MG tablet Take 1 tablet (40 mg total) by mouth daily at 6 PM.  . buPROPion (WELLBUTRIN SR) 100 MG 12 hr tablet Take 100 mg by mouth 2 (two) times daily.  . clopidogrel (PLAVIX) 75 MG tablet Take 1 tablet (75 mg total) by mouth daily.  . hydrALAZINE (APRESOLINE) 50 MG  tablet Take 1 tablet (50 mg total) by mouth every 8 (eight) hours.  . paliperidone (INVEGA) 6 MG 24 hr tablet Take 6 mg by mouth at bedtime.  . [DISCONTINUED] lisinopril (PRINIVIL,ZESTRIL) 20 MG tablet Take 1 tablet (20 mg total) by mouth daily.     Allergies:   Lisinopril   Social History   Socioeconomic History  . Marital status: Married    Spouse name: Not on file  . Number of children: Not on file  . Years of education: Not on file  . Highest education level:  Not on file  Occupational History  . Not on file  Social Needs  . Financial resource strain: Not on file  . Food insecurity:    Worry: Not on file    Inability: Not on file  . Transportation needs:    Medical: Not on file    Non-medical: Not on file  Tobacco Use  . Smoking status: Former Smoker    Packs/day: 0.25    Years: 15.00    Pack years: 3.75    Types: Cigarettes    Last attempt to quit: 11/06/2015    Years since quitting: 2.1  . Smokeless tobacco: Never Used  . Tobacco comment: quit in Juy 2015  Substance and Sexual Activity  . Alcohol use: Yes    Alcohol/week: 12.0 standard drinks    Types: 12 Cans of beer per week    Comment: 12 pack on weekends   . Drug use: No  . Sexual activity: Not on file  Lifestyle  . Physical activity:    Days per week: Not on file    Minutes per session: Not on file  . Stress: Not on file  Relationships  . Social connections:    Talks on phone: Not on file    Gets together: Not on file    Attends religious service: Not on file    Active member of club or organization: Not on file    Attends meetings of clubs or organizations: Not on file    Relationship status: Not on file  Other Topics Concern  . Not on file  Social History Narrative  . Not on file     Family History: The patient's family history includes Asthma in his daughter and son; Diabetes in his father, maternal grandmother, mother, and sister; Hyperlipidemia in his maternal grandmother; Hypertension in his father, mother, and sister; Stroke in his father, mother, and sister.  ROS:   Please see the history of present illness.    All other systems reviewed and are negative.  EKGs/Labs/Other Studies Reviewed:    The following studies were reviewed today:  EKG:  EKG is ordered today.  The ekg ordered today demonstrates normal sinus rhythm with a ventricular rate of 60 bpm, left ventricular hypertrophy with repolarization change.  Recent Labs: 08/28/2017: ALT 20;  Hemoglobin 16.0; Platelets 191 08/30/2017: BUN 14; Creatinine, Ser 1.64; Potassium 3.5; Sodium 139; TSH 2.068  Recent Lipid Panel    Component Value Date/Time   CHOL 180 08/28/2017 2317   TRIG 281 (H) 08/28/2017 2317   HDL 36 (L) 08/28/2017 2317   CHOLHDL 5.0 08/28/2017 2317   VLDL 56 (H) 08/28/2017 2317   LDLCALC 88 08/28/2017 2317    Physical Exam:    VS:  BP (!) 162/100   Pulse 60   Ht 6\' 3"  (1.905 m)   Wt 204 lb 6.4 oz (92.7 kg)   BMI 25.55 kg/m     Wt Readings from Last  3 Encounters:  12/11/17 204 lb 6.4 oz (92.7 kg)  08/28/17 210 lb (95.3 kg)  11/27/16 225 lb (102.1 kg)     Constitutional: No acute distress Eyes: pupils equally round and reactive to light, sclera non-icteric, normal conjunctiva and lids ENMT: normal dentition, moist mucous membranes Cardiovascular: regular rhythm, normal rate, no murmurs. S1 and S2 normal. Radial pulses normal bilaterally. No jugular venous distention.  Respiratory: clear to auscultation bilaterally GI : normal bowel sounds, soft and nontender. No distention.   MSK: extremities warm, well perfused. No edema.  NEURO: grossly nonfocal exam, moves all extremities. PSYCH: alert and oriented x 3, normal mood and affect.   ASSESSMENT:    1. Hypertension, unspecified type   2. CKD (chronic kidney disease), stage II   3. Cerebrovascular accident (CVA), unspecified mechanism (HCC)    PLAN:    His hypertension is uncontrolled, and we will need to revisit certain agents that he may have been taking in the past but it is unclear why they were stopped.  We will avoid ACE inhibitors and arms currently due to a concerning patient report of possible angioedema.  Alternate therapy that may be advantageous for blood pressure control include carvedilol, doxazosin, spironolactone, and thiazide diuretics.  We can initiate carvedilol at a low dose in combination with doxazosin, and reassess at a short interval.  In addition, I do not see  documentation of evaluation for secondary causes of hypertension. I would recommend renal artery ultrasound with dopplers to rule out renal artery stenosis.   I would like for Mr. Duman to see nephrology for chronic kidney disease management as well as assistance with hypertension recommendations. We will arrange this follow up.   Medication recommendations: Carvedilol 6.25 mg BID (hold if HR <50) Doxazosin 2 mg daily Can give prn dose of 50 mg hydralazine if BP>160/100  Continue: Atorvastatin 40 mg daily  He should continue plavix for the indication of stroke per neurology.  Medication Adjustments/Labs and Tests Ordered: Current medicines are reviewed at length with the patient today.  Concerns regarding medicines are outlined above.  Orders Placed This Encounter  Procedures  . Ambulatory referral to Nephrology  . EKG 12-Lead   No orders of the defined types were placed in this encounter.   Patient Instructions  Medication Instructions:  Your physician has recommended you make the following change in your medication:  STOP Lisinopril  If you need a refill on your cardiac medications before your next appointment, please call your pharmacy.   Lab work: None ordered If you have labs (blood work) drawn today and your tests are completely normal, you will receive your results only by: Marland Kitchen MyChart Message (if you have MyChart) OR . A paper copy in the mail If you have any lab test that is abnormal or we need to change your treatment, we will call you to review the results.  Testing/Procedures: None ordered  Follow-Up: At [facility], you and your health needs are our priority.  As part of our continuing mission to provide you with exceptional heart care, we have created designated Provider Care Teams.  These Care Teams include your primary Cardiologist (physician) and Advanced Practice Providers (APPs -  Physician Assistants and Nurse Practitioners) who all work together to  provide you with the care you need, when you need it. You will need a follow up appointment in 6 weeks.    You have been referred to Nephrology [facility] Dr.Jay Allena Katz  Any Other Special Instructions Will Be  Listed Below (If Applicable). Please call or send in an updated list of your updated medication list      Signed, Parke Poisson, MD  12/17/2017 1:37 PM    [facility]

## 2017-12-24 ENCOUNTER — Other Ambulatory Visit: Payer: Self-pay

## 2017-12-24 MED ORDER — DOXAZOSIN MESYLATE 2 MG PO TABS: 2.0000 mg | ORAL_TABLET | Freq: Every day | ORAL | 5 refills | Status: DC

## 2018-01-29 ENCOUNTER — Telehealth: Payer: Self-pay

## 2018-01-31 NOTE — Telephone Encounter (Signed)
Note made in error

## 2018-02-07 ENCOUNTER — Encounter (INDEPENDENT_AMBULATORY_CARE_PROVIDER_SITE_OTHER): Payer: Self-pay

## 2018-02-07 ENCOUNTER — Ambulatory Visit (INDEPENDENT_AMBULATORY_CARE_PROVIDER_SITE_OTHER): Payer: Medicaid Other | Admitting: Internal Medicine

## 2018-02-07 ENCOUNTER — Encounter: Payer: Self-pay | Admitting: Internal Medicine

## 2018-02-07 VITALS — BP 192/122 | HR 56 | Ht 75.0 in | Wt 206.6 lb

## 2018-02-07 DIAGNOSIS — E7849 Other hyperlipidemia: Secondary | ICD-10-CM | POA: Diagnosis not present

## 2018-02-07 DIAGNOSIS — I639 Cerebral infarction, unspecified: Secondary | ICD-10-CM

## 2018-02-07 DIAGNOSIS — I10 Essential (primary) hypertension: Secondary | ICD-10-CM | POA: Diagnosis not present

## 2018-02-07 MED ORDER — ATORVASTATIN CALCIUM 40 MG PO TABS: 40.0000 mg | ORAL_TABLET | Freq: Every day | ORAL | 1 refills | Status: DC

## 2018-02-07 MED ORDER — DOXAZOSIN MESYLATE 4 MG PO TABS: 4.0000 mg | ORAL_TABLET | Freq: Every day | ORAL | 5 refills | Status: DC

## 2018-02-07 MED ORDER — HYDRALAZINE HCL 50 MG PO TABS: ORAL_TABLET | ORAL | 1 refills | Status: DC

## 2018-02-07 MED ORDER — CARVEDILOL 6.25 MG PO TABS: 6.2500 mg | ORAL_TABLET | Freq: Two times a day (BID) | ORAL | 1 refills | Status: DC

## 2018-02-07 NOTE — Patient Instructions (Signed)
Medication Instructions:  Your physician has recommended you make the following change in your medication:   INCREASE Doxazosin to 4mg  daily. [facility]  INCREASE Hydralazine to 50mg  daily  You medications have been refilled today  If you need a refill on your cardiac medications before your next appointment, please call your pharmacy.   Lab work: None ordered If you have labs (blood work) drawn today and your tests are completely normal, you will receive your results only by: Marland Kitchen. MyChart Message (if you have MyChart) OR . A paper copy in the mail If you have any lab test that is abnormal or we need to change your treatment, we will call you to review the results.  Testing/Procedures: None ordered  Follow-Up: At [facility], you and your health needs are our priority.  As part of our continuing mission to provide you with exceptional heart care, we have created designated Provider Care Teams.  These Care Teams include your primary Cardiologist (physician) and Advanced Practice Providers (APPs -  Physician Assistants and Nurse Practitioners) who all work together to provide you with the care you need, when you need it. You will need a follow up appointment in 4-6 weeks with Dr.Acharya.    Any Other Special Instructions Will Be Listed Below (If Applicable). Your physician has requested that you regularly monitor and record your blood pressure readings at home. Please use the same machine at the same time of day to check your readings and record them to bring to your follow-up visit.

## 2018-02-07 NOTE — Progress Notes (Signed)
Cardiology Office Note:    Date:  02/07/2018   ID:  Thomas [name], DOB January 06, 1966, MRN [id number]  PCP:  Fleet Contras, MD  Cardiologist:  Parke Poisson, MD  Electrophysiologist:  None   Referring MD: Fleet Contras, MD   Hypertension follow up.  History of Present Illness:    Thomas [name] is a 53 y.o. male with a hx of severe hypertension, hyperlipidemia, current tobacco use, bipolar disorder, and medication inconsistency.  He returns today for follow-up. His daughter Glenis Smoker is our Engineer, site in cardiology and presents with him for his appointment, along with his wife and son.   Unfortunately at presentation today his blood pressure is 200/120 mmHg.  He has previously experienced stroke, and is to continue on Plavix lifelong.  He has a question of facial swelling with lisinopril and this is been added to his medication intolerance list.  His family tells me that when he takes his medications his blood pressure is in the 140 systolic range.  This is reasonable at this time, and further control can be pursued when medication compliance is pursued.  His current regimen includes carvedilol 6.25 mg twice daily, doxazosin 2 mg once daily, and hydralazine as needed for systolic blood pressure greater than 160.  He endorses current smoking and his family tells me that he has been smoking quite a bit recently.  He has no current plan to quit, but his family is motivated to help him quit.  The patient denies chest pain, chest pressure, dyspnea at rest or with exertion, palpitations, PND, orthopnea, or leg swelling. Denies syncope or presyncope. Denies dizziness or lightheadedness.   Past Medical History:  Diagnosis Date  . Abnormal EKG   . Bipolar disorder (HCC)   . Cardiomegaly   . CKD (chronic kidney disease), stage II   . Family history of heart disease   . H/O medication noncompliance   . Hyperlipidemia   . Hypertension   . Stroke (HCC) 11/07/2015  . Tobacco abuse      Past Surgical History:  Procedure Laterality Date  . ANKLE FRACTURE SURGERY    . NM MYOCAR PERF WALL MOTION  06/28/2011   protocol Bruce, normal perfusion nin all regions, post stress EF 57%,, exercise cap  . TRANSTHORACIC ECHOCARDIOGRAM  06/28/2011   EF=55%, Proximal septal thickening, borderline LA enlargement, boarderline aortic root dialation    Current Medications: Current Meds  Medication Sig  . atorvastatin (LIPITOR) 40 MG tablet Take 1 tablet (40 mg total) by mouth daily at 6 PM.  . carvedilol (COREG) 6.25 MG tablet Take 1 tablet (6.25 mg total) by mouth 2 (two) times daily.  . clopidogrel (PLAVIX) 75 MG tablet Take 1 tablet (75 mg total) by mouth daily.  Marland Kitchen doxazosin (CARDURA) 4 MG tablet Take 1 tablet (4 mg total) by mouth daily.  . hydrALAZINE (APRESOLINE) 50 MG tablet Take daily as needed for BP systolic >160  . [DISCONTINUED] atorvastatin (LIPITOR) 40 MG tablet Take 1 tablet (40 mg total) by mouth daily at 6 PM.  . [DISCONTINUED] carvedilol (COREG) 6.25 MG tablet Take 1 tablet (6.25 mg total) by mouth 2 (two) times daily.  . [DISCONTINUED] doxazosin (CARDURA) 2 MG tablet Take 1 tablet (2 mg total) by mouth daily.  . [DISCONTINUED] hydrALAZINE (APRESOLINE) 50 MG tablet Take daily as needed for BP systolic >160     Allergies:   Lisinopril   Social History   Socioeconomic History  . Marital status: Married    Spouse  name: Not on file  . Number of children: Not on file  . Years of education: Not on file  . Highest education level: Not on file  Occupational History  . Not on file  Social Needs  . Financial resource strain: Not on file  . Food insecurity:    Worry: Not on file    Inability: Not on file  . Transportation needs:    Medical: Not on file    Non-medical: Not on file  Tobacco Use  . Smoking status: Former Smoker    Packs/day: 0.25    Years: 15.00    Pack years: 3.75    Types: Cigarettes    Last attempt to quit: 11/06/2015    Years since  quitting: 2.2  . Smokeless tobacco: Never Used  . Tobacco comment: quit in Juy 2015  Substance and Sexual Activity  . Alcohol use: Yes    Alcohol/week: 12.0 standard drinks    Types: 12 Cans of beer per week    Comment: 12 pack on weekends   . Drug use: No  . Sexual activity: Not on file  Lifestyle  . Physical activity:    Days per week: Not on file    Minutes per session: Not on file  . Stress: Not on file  Relationships  . Social connections:    Talks on phone: Not on file    Gets together: Not on file    Attends religious service: Not on file    Active member of club or organization: Not on file    Attends meetings of clubs or organizations: Not on file    Relationship status: Not on file  Other Topics Concern  . Not on file  Social History Narrative  . Not on file     Family History: The patient's family history includes Asthma in his daughter and son; Diabetes in his father, maternal grandmother, mother, and sister; Hyperlipidemia in his maternal grandmother; Hypertension in his father, mother, and sister; Stroke in his father, mother, and sister.  ROS:   Please see the history of present illness.    All other systems reviewed and are negative.  EKGs/Labs/Other Studies Reviewed:    The following studies were reviewed today:  EKG: Not obtained today  Recent Labs: 08/28/2017: ALT 20; Hemoglobin 16.0; Platelets 191 08/30/2017: BUN 14; Creatinine, Ser 1.64; Potassium 3.5; Sodium 139; TSH 2.068  Recent Lipid Panel    Component Value Date/Time   CHOL 180 08/28/2017 2317   TRIG 281 (H) 08/28/2017 2317   HDL 36 (L) 08/28/2017 2317   CHOLHDL 5.0 08/28/2017 2317   VLDL 56 (H) 08/28/2017 2317   LDLCALC 88 08/28/2017 2317    Physical Exam:    VS:  BP (!) 192/122 (BP Location: Left Arm)   Pulse (!) 56   Ht 6\' 3"  (1.905 m)   Wt 206 lb 9.6 oz (93.7 kg)   SpO2 99%   BMI 25.82 kg/m     Wt Readings from Last 3 Encounters:  02/07/18 206 lb 9.6 oz (93.7 kg)  12/11/17  204 lb 6.4 oz (92.7 kg)  08/28/17 210 lb (95.3 kg)     Constitutional: No acute distress Eyes: pupils equally round and reactive to light, sclera non-icteric, normal conjunctiva and lids ENMT: normal dentition, moist mucous membranes Cardiovascular: regular rhythm, normal rate, no murmurs. S1 and S2 normal. Radial pulses normal bilaterally. No jugular venous distention.  Respiratory: clear to auscultation bilaterally GI : normal bowel sounds, soft and nontender. No  distention.   MSK: extremities warm, well perfused. No edema.  NEURO: grossly nonfocal exam, moves all extremities. PSYCH: alert and oriented x 3, normal mood and affect.   ASSESSMENT:    1. Essential hypertension   2. Cerebrovascular accident (CVA), unspecified mechanism (HCC)   3. Other hyperlipidemia    PLAN:    Mr. Mecham continues to have significantly elevated blood pressures.  Fortunately he is asymptomatic today and does not require acute hospitalization.  He had just taken his medications prior to our visit today, and his blood pressure remains elevated at dismissal from the appointment, however slightly improved.  Our main struggle is medication compliance, and I believe this is because many medications for hypertension are multiple times a day, which he is struggling to remember.  I will increase his doxazosin to 4 mg daily since that is a once a day medication, and I encouraged him to continue taking carvedilol twice daily, and at least take 1 hydralazine a day.  If he is able to remember any combination of this, I believe he may have adequate blood pressure response.  We have discussed in detail worrisome features of systolic and diastolic blood pressures as high as we have recorded today.  We discussed smoking cessation.  I will see him back in 4 to 6 weeks for continued medication titration and review of nephrology consultation.  Medication Adjustments/Labs and Tests Ordered: Current medicines are reviewed at  length with the patient today.  Concerns regarding medicines are outlined above.  No orders of the defined types were placed in this encounter.  Meds ordered this encounter  Medications  . atorvastatin (LIPITOR) 40 MG tablet    Sig: Take 1 tablet (40 mg total) by mouth daily at 6 PM.    Dispense:  90 tablet    Refill:  1  . carvedilol (COREG) 6.25 MG tablet    Sig: Take 1 tablet (6.25 mg total) by mouth 2 (two) times daily.    Dispense:  180 tablet    Refill:  1  . hydrALAZINE (APRESOLINE) 50 MG tablet    Sig: Take daily as needed for BP systolic >160    Dispense:  90 tablet    Refill:  1  . doxazosin (CARDURA) 4 MG tablet    Sig: Take 1 tablet (4 mg total) by mouth daily.    Dispense:  90 tablet    Refill:  5    Dosage increase    Patient Instructions  Medication Instructions:  Your physician has recommended you make the following change in your medication:   INCREASE Doxazosin to 4mg  daily. An Rx has been sent to [facility] pharmacy.  INCREASE Hydralazine to 50mg  daily  You medications have been refilled today  If you need a refill on your cardiac medications before your next appointment, please call your pharmacy.   Lab work: None ordered If you have labs (blood work) drawn today and your tests are completely normal, you will receive your results only by: Marland Kitchen MyChart Message (if you have MyChart) OR . A paper copy in the mail If you have any lab test that is abnormal or we need to change your treatment, we will call you to review the results.  Testing/Procedures: None ordered  Follow-Up: At [facility], you and your health needs are our priority.  As part of our continuing mission to provide you with exceptional heart care, we have created designated Provider Care Teams.  These Care Teams include your primary Cardiologist (physician)  and Advanced Practice Providers (APPs -  Physician Assistants and Nurse Practitioners) who all work together to provide you with the care  you need, when you need it. You will need a follow up appointment in 4-6 weeks with Dr.Helma Argyle.    Any Other Special Instructions Will Be Listed Below (If Applicable). Your physician has requested that you regularly monitor and record your blood pressure readings at home. Please use the same machine at the same time of day to check your readings and record them to bring to your follow-up visit.        Signed, Parke PoissonGayatri A Jaianna Nicoll, MD  02/07/2018 1:47 PM    [facility]

## 2018-03-11 ENCOUNTER — Other Ambulatory Visit: Payer: Self-pay

## 2018-03-11 MED ORDER — DOXAZOSIN MESYLATE 4 MG PO TABS: 4.0000 mg | ORAL_TABLET | Freq: Every day | ORAL | 5 refills | Status: DC

## 2018-03-11 MED ORDER — CARVEDILOL 6.25 MG PO TABS: 6.2500 mg | ORAL_TABLET | Freq: Two times a day (BID) | ORAL | 2 refills | Status: DC

## 2018-03-11 MED ORDER — ATORVASTATIN CALCIUM 40 MG PO TABS: 40.0000 mg | ORAL_TABLET | Freq: Every day | ORAL | 3 refills | Status: DC

## 2018-03-11 MED ORDER — CLOPIDOGREL BISULFATE 75 MG PO TABS: 75.0000 mg | ORAL_TABLET | Freq: Every day | ORAL | 3 refills | Status: DC

## 2018-03-11 MED ORDER — HYDRALAZINE HCL 50 MG PO TABS: ORAL_TABLET | ORAL | 1 refills | Status: DC

## 2018-03-12 ENCOUNTER — Ambulatory Visit: Payer: Medicaid Other | Admitting: Internal Medicine

## 2018-04-24 ENCOUNTER — Telehealth: Payer: Self-pay

## 2018-04-24 NOTE — Telephone Encounter (Signed)
   Primary Cardiologist:  Parke Poisson, MD   Patient contacted.  History reviewed.  No symptoms to suggest any unstable cardiac conditions.  Based on discussion, with current pandemic situation, we will be postponing this appointment for Thomas [name] with a plan for f/u in 8 wks or sooner if feasible/necessary.  If symptoms change, he has been instructed to contact our office.    NOVIAN SCHUENEMAN, CMA  04/24/2018 3:56 PM         .

## 2018-05-14 ENCOUNTER — Ambulatory Visit: Payer: Medicaid Other | Admitting: Internal Medicine

## 2018-05-16 NOTE — Telephone Encounter (Signed)
   Spoke w/ patient, made appt w/ Dr Jacques Navy for July 27th at 8:00 am.  He likes am appts, will be there.  No other issues or concerns.  Theodore Demark, PA-C 05/16/2018 4:25 PM Beeper [phone]

## 2018-06-06 ENCOUNTER — Encounter (HOSPITAL_COMMUNITY): Payer: Self-pay

## 2018-06-06 ENCOUNTER — Emergency Department (HOSPITAL_COMMUNITY): Payer: Medicaid Other

## 2018-06-06 ENCOUNTER — Ambulatory Visit (INDEPENDENT_AMBULATORY_CARE_PROVIDER_SITE_OTHER)
Admission: EM | Admit: 2018-06-06 | Discharge: 2018-06-06 | Disposition: A | Discharge status: Home / Self Care | Payer: Medicaid Other | Source: Home / Self Care | Attending: Family Medicine | Admitting: Family Medicine

## 2018-06-06 ENCOUNTER — Inpatient Hospital Stay (HOSPITAL_COMMUNITY): Payer: Medicaid Other

## 2018-06-06 ENCOUNTER — Other Ambulatory Visit: Payer: Self-pay

## 2018-06-06 ENCOUNTER — Inpatient Hospital Stay (HOSPITAL_COMMUNITY)
Admission: EM | Admit: 2018-06-06 | Discharge: 2018-06-07 | DRG: 065 | Disposition: A | Discharge status: Home / Self Care | Payer: Medicaid Other | Attending: Internal Medicine | Admitting: Internal Medicine

## 2018-06-06 ENCOUNTER — Encounter (HOSPITAL_COMMUNITY): Payer: Self-pay | Admitting: Emergency Medicine

## 2018-06-06 DIAGNOSIS — H538 Other visual disturbances: Secondary | ICD-10-CM | POA: Diagnosis present

## 2018-06-06 DIAGNOSIS — F17210 Nicotine dependence, cigarettes, uncomplicated: Secondary | ICD-10-CM | POA: Diagnosis present

## 2018-06-06 DIAGNOSIS — F1210 Cannabis abuse, uncomplicated: Secondary | ICD-10-CM | POA: Diagnosis present

## 2018-06-06 DIAGNOSIS — N183 Chronic kidney disease, stage 3 (moderate): Secondary | ICD-10-CM | POA: Diagnosis present

## 2018-06-06 DIAGNOSIS — Z8673 Personal history of transient ischemic attack (TIA), and cerebral infarction without residual deficits: Secondary | ICD-10-CM | POA: Diagnosis not present

## 2018-06-06 DIAGNOSIS — F1290 Cannabis use, unspecified, uncomplicated: Secondary | ICD-10-CM | POA: Diagnosis not present

## 2018-06-06 DIAGNOSIS — I248 Other forms of acute ischemic heart disease: Secondary | ICD-10-CM | POA: Diagnosis present

## 2018-06-06 DIAGNOSIS — E785 Hyperlipidemia, unspecified: Secondary | ICD-10-CM | POA: Diagnosis present

## 2018-06-06 DIAGNOSIS — Z79899 Other long term (current) drug therapy: Secondary | ICD-10-CM | POA: Diagnosis not present

## 2018-06-06 DIAGNOSIS — I160 Hypertensive urgency: Secondary | ICD-10-CM | POA: Diagnosis not present

## 2018-06-06 DIAGNOSIS — I639 Cerebral infarction, unspecified: Secondary | ICD-10-CM | POA: Diagnosis present

## 2018-06-06 DIAGNOSIS — I10 Essential (primary) hypertension: Secondary | ICD-10-CM | POA: Diagnosis present

## 2018-06-06 DIAGNOSIS — I6330 Cerebral infarction due to thrombosis of unspecified cerebral artery: Secondary | ICD-10-CM

## 2018-06-06 DIAGNOSIS — R7989 Other specified abnormal findings of blood chemistry: Secondary | ICD-10-CM | POA: Diagnosis not present

## 2018-06-06 DIAGNOSIS — Z7902 Long term (current) use of antithrombotics/antiplatelets: Secondary | ICD-10-CM

## 2018-06-06 DIAGNOSIS — I161 Hypertensive emergency: Secondary | ICD-10-CM | POA: Diagnosis present

## 2018-06-06 DIAGNOSIS — I129 Hypertensive chronic kidney disease with stage 1 through stage 4 chronic kidney disease, or unspecified chronic kidney disease: Secondary | ICD-10-CM | POA: Diagnosis present

## 2018-06-06 DIAGNOSIS — Z7982 Long term (current) use of aspirin: Secondary | ICD-10-CM | POA: Diagnosis not present

## 2018-06-06 DIAGNOSIS — F17200 Nicotine dependence, unspecified, uncomplicated: Secondary | ICD-10-CM

## 2018-06-06 DIAGNOSIS — Z823 Family history of stroke: Secondary | ICD-10-CM

## 2018-06-06 DIAGNOSIS — Z833 Family history of diabetes mellitus: Secondary | ICD-10-CM | POA: Diagnosis not present

## 2018-06-06 DIAGNOSIS — R29700 NIHSS score 0: Secondary | ICD-10-CM | POA: Diagnosis present

## 2018-06-06 DIAGNOSIS — F319 Bipolar disorder, unspecified: Secondary | ICD-10-CM | POA: Diagnosis present

## 2018-06-06 DIAGNOSIS — F1410 Cocaine abuse, uncomplicated: Secondary | ICD-10-CM | POA: Diagnosis present

## 2018-06-06 DIAGNOSIS — Z1159 Encounter for screening for other viral diseases: Secondary | ICD-10-CM

## 2018-06-06 LAB — CBC
HCT: 49.5 % (ref 39.0–52.0)
Hemoglobin: 16.4 g/dL (ref 13.0–17.0)
MCH: 28.5 pg (ref 26.0–34.0)
MCHC: 33.1 g/dL (ref 30.0–36.0)
MCV: 85.9 fL (ref 80.0–100.0)
Platelets: 181 10*3/uL (ref 150–400)
RBC: 5.76 MIL/uL (ref 4.22–5.81)
RDW: 14.1 % (ref 11.5–15.5)
WBC: 3.6 10*3/uL — ABNORMAL LOW (ref 4.0–10.5)
nRBC: 0 % (ref 0.0–0.2)

## 2018-06-06 LAB — COMPREHENSIVE METABOLIC PANEL
ALT: 24 U/L (ref 0–44)
AST: 24 U/L (ref 15–41)
Albumin: 4.4 g/dL (ref 3.5–5.0)
Alkaline Phosphatase: 77 U/L (ref 38–126)
Anion gap: 8 (ref 5–15)
BUN: 16 mg/dL (ref 6–20)
CO2: 28 mmol/L (ref 22–32)
Calcium: 9.7 mg/dL (ref 8.9–10.3)
Chloride: 103 mmol/L (ref 98–111)
Creatinine, Ser: 2.07 mg/dL — ABNORMAL HIGH (ref 0.61–1.24)
GFR calc Af Amer: 41 mL/min — ABNORMAL LOW (ref >60)
GFR calc non Af Amer: 36 mL/min — ABNORMAL LOW (ref >60)
Glucose, Bld: 98 mg/dL (ref 70–99)
Potassium: 4.2 mmol/L (ref 3.5–5.1)
Sodium: 139 mmol/L (ref 135–145)
Total Bilirubin: 1.3 mg/dL — ABNORMAL HIGH (ref 0.3–1.2)
Total Protein: 7.5 g/dL (ref 6.5–8.1)

## 2018-06-06 LAB — DIFFERENTIAL
Abs Immature Granulocytes: 0.01 10*3/uL (ref 0.00–0.07)
Basophils Absolute: 0 10*3/uL (ref 0.0–0.1)
Basophils Relative: 1 %
Eosinophils Absolute: 0.2 10*3/uL (ref 0.0–0.5)
Eosinophils Relative: 5 %
Immature Granulocytes: 0 %
Lymphocytes Relative: 41 %
Lymphs Abs: 1.5 10*3/uL (ref 0.7–4.0)
Monocytes Absolute: 0.4 10*3/uL (ref 0.1–1.0)
Monocytes Relative: 12 %
Neutro Abs: 1.5 10*3/uL — ABNORMAL LOW (ref 1.7–7.7)
Neutrophils Relative %: 41 %

## 2018-06-06 LAB — I-STAT CHEM 8, ED
BUN: 25 mg/dL — ABNORMAL HIGH (ref 6–20)
Calcium, Ion: 1.09 mmol/L — ABNORMAL LOW (ref 1.15–1.40)
Chloride: 105 mmol/L (ref 98–111)
Creatinine, Ser: 2.1 mg/dL — ABNORMAL HIGH (ref 0.61–1.24)
Glucose, Bld: 90 mg/dL (ref 70–99)
HCT: 50 % (ref 39.0–52.0)
Hemoglobin: 17 g/dL (ref 13.0–17.0)
Potassium: 4.6 mmol/L (ref 3.5–5.1)
Sodium: 138 mmol/L (ref 135–145)
TCO2: 28 mmol/L (ref 22–32)

## 2018-06-06 LAB — TROPONIN I: Troponin I: 0.04 ng/mL (ref <0.03)

## 2018-06-06 LAB — PROTIME-INR
INR: 1.2 (ref 0.8–1.2)
Prothrombin Time: 14.9 seconds (ref 11.4–15.2)

## 2018-06-06 LAB — SARS CORONAVIRUS 2 BY RT PCR (HOSPITAL ORDER, PERFORMED IN ~~LOC~~ HOSPITAL LAB): SARS Coronavirus 2: NEGATIVE

## 2018-06-06 LAB — CBG MONITORING, ED: Glucose-Capillary: 71 mg/dL (ref 70–99)

## 2018-06-06 LAB — APTT: aPTT: 30 seconds (ref 24–36)

## 2018-06-06 MED ORDER — HYDRALAZINE HCL 20 MG/ML IJ SOLN
10.0000 mg | Freq: Once | INTRAMUSCULAR | Status: AC
  Administered 2018-06-06: 10 mg via INTRAVENOUS
  Filled 2018-06-06: qty 1

## 2018-06-06 MED ORDER — ASPIRIN 300 MG RE SUPP: 300.0000 mg | Freq: Every day | RECTAL | Status: DC

## 2018-06-06 MED ORDER — ASPIRIN 325 MG PO TABS
325.0000 mg | ORAL_TABLET | Freq: Every day | ORAL | Status: DC
  Administered 2018-06-06: 325 mg via ORAL
  Filled 2018-06-06: qty 1

## 2018-06-06 MED ORDER — SODIUM CHLORIDE 0.9 % IV SOLN: INTRAVENOUS | Status: DC

## 2018-06-06 MED ORDER — STROKE: EARLY STAGES OF RECOVERY BOOK
Freq: Once | Status: AC
  Administered 2018-06-06: 19:00:00

## 2018-06-06 MED ORDER — SODIUM CHLORIDE 0.9% FLUSH
3.0000 mL | Freq: Once | INTRAVENOUS | Status: AC
  Administered 2018-06-06: 3 mL via INTRAVENOUS

## 2018-06-06 MED ORDER — ACETAMINOPHEN 160 MG/5ML PO SOLN: 650.0000 mg | ORAL | Status: DC | PRN

## 2018-06-06 MED ORDER — LABETALOL HCL 5 MG/ML IV SOLN
10.0000 mg | INTRAVENOUS | Status: DC | PRN
  Administered 2018-06-06 – 2018-06-07 (×6): 10 mg via INTRAVENOUS
  Filled 2018-06-06 (×5): qty 4

## 2018-06-06 MED ORDER — SENNOSIDES-DOCUSATE SODIUM 8.6-50 MG PO TABS: 1.0000 | ORAL_TABLET | Freq: Every evening | ORAL | Status: DC | PRN

## 2018-06-06 MED ORDER — ACETAMINOPHEN 325 MG PO TABS: 650.0000 mg | ORAL_TABLET | ORAL | Status: DC | PRN

## 2018-06-06 MED ORDER — ACETAMINOPHEN 650 MG RE SUPP: 650.0000 mg | RECTAL | Status: DC | PRN

## 2018-06-06 MED ORDER — HYDRALAZINE HCL 25 MG PO TABS
50.0000 mg | ORAL_TABLET | Freq: Once | ORAL | Status: AC
  Administered 2018-06-06: 50 mg via ORAL
  Filled 2018-06-06: qty 2

## 2018-06-06 NOTE — Progress Notes (Signed)
Neurology, Dr. Amada Jupiter notified of MRI finding and his team will see patient formally.

## 2018-06-06 NOTE — H&P (Signed)
History and Physical    Thomas [name]:[id number] DOB: July 03, 1965 DOA: 06/06/2018  PCP: Fleet Contras, MD Patient coming from: Home  Chief Complaint: Blurry vision  HPI: Thomas [name] is a 53 y.o. male with history of hypertension, CVA without residual deficits, hyperlipidemia, CKD 3, bipolar disorder, tobacco use disorder and marijuana use disorder presenting with blurry vision mainly in the right eye since yesterday.  He stated that his vision has been blurry on and off since yesterday.  This started while mowing grass in his yard.  He denies trauma.  He says his vision is better when he puts on his reading glass.  He feels he can see well now.  He denies headache, double vision, lacrimation, burning, focal weakness, numbness or tingling, chest pain, dyspnea, palpitation, nausea, vomiting, photophobia, phonophobia, URI symptoms, fever, chills or neck stiffness.  He reports good compliance with his blood pressure medication.  He says his last dose was about noon today.  He smokes about 2 to 3 cigarettes a day.  He also admits to marijuana use.  He denies cocaine or other recreational drug use.  Reports rare alcohol use.  In ED, SBP elevated to 247.  DBP as high as 147.  Started on PRN hydralazine.  Blood pressure improved to 207/126.  CBC not impressive.  CMP significant for creatinine to 2.07 (baseline 1.6-2.0).  CBG 71.  Troponin  0.04.  PT/INR 14.9/1.2.  EKG reviewed by me with sinus bradycardia to 50, TWI in inferior and lateral leads which seems to be present on his previous EKG about 5 months ago.  CT head without contrast concerning for small acute infarct in the inferior mid right centrum semiovale, and old basal ganglia infarcts.  Per EDP, neurology, Dr. Petra Kuba consulted and recommended MRI head, admission and BP goal less than 220/120.  Patient was given hydralazine x3.  COVID-19 test was pending.  ROS All review of system negative except for pertinent positives and negatives  as history of present illness above.  PMH Past Medical History:  Diagnosis Date  . Abnormal EKG   . Bipolar disorder (HCC)   . Cardiomegaly   . CKD (chronic kidney disease), stage II   . Family history of heart disease   . H/O medication noncompliance   . Hyperlipidemia   . Hypertension   . Stroke (HCC) 11/07/2015  . Tobacco abuse    PSH Past Surgical History:  Procedure Laterality Date  . ANKLE FRACTURE SURGERY    . NM MYOCAR PERF WALL MOTION  06/28/2011   protocol Bruce, normal perfusion nin all regions, post stress EF 57%,, exercise cap  . TRANSTHORACIC ECHOCARDIOGRAM  06/28/2011   EF=55%, Proximal septal thickening, borderline LA enlargement, boarderline aortic root dialation   Fam HX Family History  Problem Relation Age of Onset  . Hypertension Mother   . Diabetes Mother   . Stroke Mother   . Hypertension Father   . Diabetes Father   . Stroke Father   . Hypertension Sister   . Stroke Sister   . Diabetes Sister   . Hyperlipidemia Maternal Grandmother   . Diabetes Maternal Grandmother   . Asthma Daughter   . Asthma Son    Social Hx  reports that he quit smoking about 2 years ago. His smoking use included cigarettes. He has a 3.75 pack-year smoking history. He has never used smokeless tobacco. He reports previous alcohol use of about 12.0 standard drinks of alcohol per week. He reports current drug use. Drug:  Marijuana.  Allergy Allergies  Allergen Reactions  . Lisinopril Swelling    Facial swelling   Home Meds Prior to Admission medications   Medication Sig Start Date End Date Taking? Authorizing Provider  aspirin 325 MG tablet Take 325 mg by mouth daily.   Yes [provider]  atorvastatin (LIPITOR) 40 MG tablet Take 1 tablet (40 mg total) by mouth daily at 6 PM. 03/11/18  Yes Parke Poisson, MD  carvedilol (COREG) 6.25 MG tablet Take 1 tablet (6.25 mg total) by mouth 2 (two) times daily. 03/11/18  Yes Parke Poisson, MD  clopidogrel  (PLAVIX) 75 MG tablet Take 1 tablet (75 mg total) by mouth daily. 03/11/18  Yes Parke Poisson, MD  doxazosin (CARDURA) 4 MG tablet Take 1 tablet (4 mg total) by mouth daily. 03/11/18  Yes Parke Poisson, MD  hydrALAZINE (APRESOLINE) 50 MG tablet Take daily as needed for BP systolic >160 03/11/18  Yes Parke Poisson, MD    Physical Exam: Vitals:   06/06/18 1534 06/06/18 1545 06/06/18 1600 06/06/18 1613  BP: (!) 218/118 (!) 207/126 (!) 217/116 (!) 217/116  Pulse: 65 62 61 69  Resp: 16   16  Temp:      TempSrc:      SpO2: 97% 98% 98% 99%  Weight:      Height:        Constitutional - resting comfortably, no acute distress Eyes -vision intact.  PERRL.  EOMI.  Visual fields intact. Nose - no gross deformity or drainage Mouth - no oral lesions noted Throat - no swelling or erythema Endo - no obvious thyromegaly CV - RRR. (+)S1S2, no murmurs; no JVD or peripheral edema.  No carotid bruits. Resp - No increased work of breathing, good aeration bilaterally, no wheeze or crackles GI - (+)BS, soft, non-tender, non-distended MSK - normal range of motion, no obvious deformity Skin - no obvious rashes or lesions Neuro -awake, alert and oriented appropriately. Cranial nerves II-XII  intact, motor 5/5 in all muscle groups of UE and LE bilaterally, normal tone, light sensation intact in all dermatomes of upper and lower ext bilaterally, no pronator drift, biceps and patellar reflexes symmetric, finger to nose intact, gait deferred. Psych - calm, normal mood and affect  Labs on Admission: I have personally reviewed following labs and imaging studies  CBC: Recent Labs  Lab 06/06/18 1335 06/06/18 1356  WBC 3.6*  --   NEUTROABS 1.5*  --   HGB 16.4 17.0  HCT 49.5 50.0  MCV 85.9  --   PLT 181  --    Basic Metabolic Panel: Recent Labs  Lab 06/06/18 1335 06/06/18 1356  NA 139 138  K 4.2 4.6  CL 103 105  CO2 28  --   GLUCOSE 98 90  BUN 16 25*  CREATININE 2.07* 2.10*  CALCIUM  9.7  --    GFR: Estimated Creatinine Clearance: 49.2 mL/min (A) (by C-G formula based on SCr of 2.1 mg/dL (H)). Liver Function Tests: Recent Labs  Lab 06/06/18 1335  AST 24  ALT 24  ALKPHOS 77  BILITOT 1.3*  PROT 7.5  ALBUMIN 4.4   No results for input(s): LIPASE, AMYLASE in the last 168 hours. No results for input(s): AMMONIA in the last 168 hours. Coagulation Profile: Recent Labs  Lab 06/06/18 1335  INR 1.2   Cardiac Enzymes: Recent Labs  Lab 06/06/18 1444  TROPONINI 0.04*   BNP (last 3 results) No results for input(s): PROBNP in the  last 8760 hours. HbA1C: No results for input(s): HGBA1C in the last 72 hours. CBG: Recent Labs  Lab 06/06/18 1453  GLUCAP 71   Lipid Profile: No results for input(s): CHOL, HDL, LDLCALC, TRIG, CHOLHDL, LDLDIRECT in the last 72 hours. Thyroid Function Tests: No results for input(s): TSH, T4TOTAL, FREET4, T3FREE, THYROIDAB in the last 72 hours. Anemia Panel: No results for input(s): VITAMINB12, FOLATE, FERRITIN, TIBC, IRON, RETICCTPCT in the last 72 hours. Urine analysis:    Component Value Date/Time   COLORURINE YELLOW 06/07/2016 1723   APPEARANCEUR CLEAR 06/07/2016 1723   LABSPEC 1.010 06/07/2016 1723   PHURINE 7.0 06/07/2016 1723   GLUCOSEU NEGATIVE 06/07/2016 1723   HGBUR NEGATIVE 06/07/2016 1723   HGBUR negative 09/28/2009 0923   BILIRUBINUR NEGATIVE 06/07/2016 1723   KETONESUR NEGATIVE 06/07/2016 1723   PROTEINUR NEGATIVE 06/07/2016 1723   UROBILINOGEN 2.0 09/28/2009 0923   NITRITE NEGATIVE 06/07/2016 1723   LEUKOCYTESUR NEGATIVE 06/07/2016 1723    Sepsis Labs:  No leukocytosis  Radiological Exams on Admission: Ct Head Wo Contrast  Result Date: 06/06/2018 CLINICAL DATA:  Dizziness and headaches.  Hypertension. EXAM: CT HEAD WITHOUT CONTRAST TECHNIQUE: Contiguous axial images were obtained from the base of the skull through the vertex without intravenous contrast. COMPARISON:  Head CT August 28, 2017 and brain MRI  August 29, 2017 FINDINGS: Brain: The ventricles are normal in size and configuration. There is no intracranial mass, hemorrhage, extra-axial fluid collection, or midline shift. There is slight small vessel disease in the centra semiovale bilaterally. There are prior lacunar infarcts in the anterior limb of each internal capsule and in the anterior right lentiform nucleus. There is a small prior infarct in the posterior limb of the right external capsule. There is a small focus of decreased attenuation in the inferior mid right centrum semiovale immediately adjacent to the right lateral ventricle which was not present previously and may represent a small new infarct in the right periventricular white matter. No other findings suggesting potential acute infarct evident. Vascular: There is no hyperdense vessel. There are foci of calcification in each carotid siphon region. Skull: Bony calvarium appears intact. Sinuses/Orbits: There is mucosal thickening involving several ethmoid air cells. Other visualized paranasal sinuses are clear. Orbits appear symmetric bilaterally. Other: Mastoid air cells are clear. IMPRESSION: Focal area of decreased attenuation in the inferior mid right centrum semiovale, immediately adjacent to the right lateral ventricle, best seen on axial slice 19 series 3, coronal slice 36 series 5, and sagittal slice 30 series 6. A small acute infarct in this area is of concern. No other findings suggesting potential acute infarct. Elsewhere, there is periventricular small vessel disease. Small basal ganglia region lacunar type infarcts, stable. No mass or hemorrhage. There are foci of arterial vascular calcification. There is mucosal thickening in several ethmoid air cells. Electronically Signed   By: Bretta BangWilliam  Woodruff III M.D.   On: 06/06/2018 14:46    All images have been reviewed by me personally.   EKG: Independently reviewed.  Sinus bradycardia to 50.  TWI in inferior and lateral leads.   Assessment/Plan Active Problems:   Accelerated hypertension  Blurry vision/accelerated hypertension/hypertensive emergency:  blurry vision in the right eye it seems to have improved.  Neuro exam within normal range.  CT head concerning for small acute infarct in the inferior mid right centrum semiovale.  MRI head ordered by EDP per neurology recommendation. -Admit to progressive unit, neuro bed. -Follow MRI brain -Permissive hypertension pending MRI brain -If MRI brain unremarkable,  will aggressively treat hypertension. -Per EDP, Dr. Petra Kuba consulted in ED. -Need high-dose aspirin -We will start statin. -Check risk stratification labs including A1c and lipid panel -Heart healthy diet.  Patient passed bedside swallow eval -PT/OT/SLP  CKD 3: Stable.  Serum creatinine about baseline.  Mildly elevated troponin: Likely demand ischemia and delayed clearance.  EKG no significant change from prior.  Patient without cardiopulmonary symptoms.  -Manage his hypertension as above  Bipolar disorder: Stable.  No medication at home.  Tobacco use disorder: Reports smoking about 2 to 3 cigarettes a day -Cessation counseling -Nicotine patch  Marijuana use disorder -Cessation counseling given  DVT prophylaxis: SCD  Code Status: Full code Family Communication: Updated patient's wife over the phone  Disposition Plan: Will admit to progressive unit for accelerated hypertension and possible acute infarct Consults called: Neurology consulted by EDP Admission status: Inpatient.  Patient with hypertensive emergency  Severity of Illness: The appropriate patient status for this patient is INPATIENT. Inpatient status is judged to be reasonable and necessary in order to provide the required intensity of service to ensure the patient's safety. The patient's presenting symptoms, physical exam findings, and initial radiographic and laboratory data in the context of their chronic comorbidities is felt to  place them at high risk for further clinical deterioration. Furthermore, it is not anticipated that the patient will be medically stable for discharge from the hospital within 2 midnights of admission. The following factors support the patient status of inpatient.    "           The patient's presenting symptoms include  blurry vision in right eye "           The worrisome physical exam findings include markedly elevated blood pressure "           The initial radiographic and laboratory data are worrisome because of acute infarct on CT head "           The chronic co-morbidities include  CVA, hypertension, hyperlipidemia, tobacco use disorder and bipolar disorder     I certify that at the point of admission it is my clinical judgment that the patient will require inpatient hospital care spanning beyond 2 midnights from the point of admission due to high intensity of service, high risk for further deterioration and high frequency of surveillance required.   Almon Hercules MD [facility]  If 7PM-7AM, please contact night-coverage [email or web address] Password [facility]  06/06/2018, 4:49 PM

## 2018-06-06 NOTE — ED Provider Notes (Signed)
[facility]    CSN: [id number] Arrival date & time: 06/06/18  1159     History   Chief Complaint Chief Complaint  Patient presents with  . Blurred Vision    HPI Thomas [name] is a 53 y.o. male history of hypertension, hyperlipidemia, previous CVA, tobacco use, CKD, presenting today for evaluation of right eye blurry vision.  Patient states that since yesterday he has had off-and-on blurry vision but will come and go.  He denies any pain.  Denies associated weakness or difficulty speaking.  States that in the past when he has had strokes he has had some blurry vision.  He is no longer on Plavix.  He does continue to take his blood pressure medicine.  Recently started wearing reading glasses.  Has had some occasional watery drainage, but feels this is unrelated to episodes of blurriness.  HPI  Past Medical History:  Diagnosis Date  . Abnormal EKG   . Bipolar disorder (HCC)   . Cardiomegaly   . CKD (chronic kidney disease), stage II   . Family history of heart disease   . H/O medication noncompliance   . Hyperlipidemia   . Hypertension   . Stroke (HCC) 11/07/2015  . Tobacco abuse     Patient Active Problem List   Diagnosis Date Noted  . 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 Stroke (HCC) 08/29/2017  . CKD (chronic kidney disease), stage II   . Bipolar disorder (HCC)   . Altered mental status 02/24/2016  . Urinary tract infection without hematuria 02/24/2016  . Acute encephalopathy 02/24/2016  . Chest pain 02/24/2016  . History of stroke   . Blurry vision, right eye 11/08/2015  . Acute cerebrovascular accident (CVA) (HCC) 11/08/2015  . Acute kidney injury superimposed on CKD (HCC) 11/07/2015  . Hyperlipidemia 01/26/2014  . TOBACCO ABUSE 04/19/2008  . HYPERTENSION, BENIGN ESSENTIAL 01/22/2005    Past Surgical History:  Procedure Laterality Date  . ANKLE FRACTURE SURGERY    . NM MYOCAR PERF WALL MOTION  06/28/2011   protocol Bruce, normal perfusion nin  all regions, post stress EF 57%,, exercise cap  . TRANSTHORACIC ECHOCARDIOGRAM  06/28/2011   EF=55%, Proximal septal thickening, borderline LA enlargement, boarderline aortic root dialation       Home Medications    Prior to Admission medications   Medication Sig Start Date End Date Taking? Authorizing Provider  atorvastatin (LIPITOR) 40 MG tablet Take 1 tablet (40 mg total) by mouth daily at 6 PM. 03/11/18   Parke Poisson, MD  carvedilol (COREG) 6.25 MG tablet Take 1 tablet (6.25 mg total) by mouth 2 (two) times daily. 03/11/18   Parke Poisson, MD  clopidogrel (PLAVIX) 75 MG tablet Take 1 tablet (75 mg total) by mouth daily. 03/11/18   Parke Poisson, MD  doxazosin (CARDURA) 4 MG tablet Take 1 tablet (4 mg total) by mouth daily. 03/11/18   Parke Poisson, MD  hydrALAZINE (APRESOLINE) 50 MG tablet Take daily as needed for BP systolic >160 03/11/18   Parke Poisson, MD    Family History Family History  Problem Relation Age of Onset  . Hypertension Mother   . Diabetes Mother   . Stroke Mother   . Hypertension Father   . Diabetes Father   . Stroke Father   . Hypertension Sister   . Stroke Sister   . Diabetes Sister   . Hyperlipidemia Maternal Grandmother   . Diabetes Maternal Grandmother   . Asthma Daughter   .  Asthma Son     Social History Social History   Tobacco Use  . Smoking status: Former Smoker    Packs/day: 0.25    Years: 15.00    Pack years: 3.75    Types: Cigarettes    Last attempt to quit: 11/06/2015    Years since quitting: 2.5  . Smokeless tobacco: Never Used  . Tobacco comment: quit in Juy 2015  Substance Use Topics  . Alcohol use: Yes    Alcohol/week: 12.0 standard drinks    Types: 12 Cans of beer per week    Comment: 12 pack on weekends   . Drug use: No     Allergies   Lisinopril   Review of Systems Review of Systems  Constitutional: Negative for fatigue and fever.  HENT: Negative for congestion, sinus pressure and  sore throat.   Eyes: Positive for visual disturbance. Negative for photophobia and pain.  Respiratory: Negative for cough and shortness of breath.   Cardiovascular: Negative for chest pain.  Gastrointestinal: Negative for abdominal pain, nausea and vomiting.  Genitourinary: Negative for decreased urine volume and hematuria.  Musculoskeletal: Negative for myalgias, neck pain and neck stiffness.  Neurological: Negative for dizziness, syncope, facial asymmetry, speech difficulty, weakness, light-headedness, numbness and headaches.     Physical Exam Triage Vital Signs ED Triage Vitals  Enc Vitals Group     BP 06/06/18 1258 (!) 232/132     Pulse Rate 06/06/18 1258 (!) 49     Resp 06/06/18 1258 20     Temp 06/06/18 1258 98.1 F (36.7 C)     Temp Source 06/06/18 1258 Oral     SpO2 06/06/18 1258 99 %     Weight --      Height --      Head Circumference --      Peak Flow --      Pain Score 06/06/18 1300 0     Pain Loc --      Pain Edu? --      Excl. in GC? --    No data found.  Updated Vital Signs BP (!) 232/132 (BP Location: Left Arm)   Pulse (!) 49   Temp 98.1 F (36.7 C) (Oral)   Resp 20   SpO2 99%   Visual Acuity Right Eye Distance:   Left Eye Distance:   Bilateral Distance:    Right Eye Near:   Left Eye Near:    Bilateral Near:     Physical Exam Vitals signs and nursing note reviewed.  Constitutional:      Appearance: He is well-developed.     Comments: No acute distress  HENT:     Head: Normocephalic and atraumatic.     Nose: Nose normal.  Eyes:     Conjunctiva/sclera: Conjunctivae normal.  Neck:     Musculoskeletal: Neck supple.  Cardiovascular:     Rate and Rhythm: Normal rate.  Pulmonary:     Effort: Pulmonary effort is normal. No respiratory distress.  Abdominal:     General: There is no distension.  Musculoskeletal: Normal range of motion.  Skin:    General: Skin is warm and dry.  Neurological:     Mental Status: He is alert and oriented to  person, place, and time.      UC Treatments / Results  Labs (all labs ordered are listed, but only abnormal results are displayed) Labs Reviewed - No data to display  EKG None  Radiology No results found.  Procedures Procedures (including critical  care time)  Medications Ordered in UC Medications - No data to display  Initial Impression / Assessment and Plan / UC Course  I have reviewed the triage vital signs and the nursing notes.  Pertinent labs & imaging results that were available during my care of the patient were reviewed by me and considered in my medical decision making (see chart for details).    53 yo male with 1 day of intermittent blurred vision. History of previous stroke. Recommending further evaluation in ED for r/o stroke vs. Hypertensive urgency treatment. Discussed with patient. Verbalized understanding. Transported with nursing staff via wheelchair.   Final Clinical Impressions(s) / UC Diagnoses   Final diagnoses:  Hypertensive urgency  Blurred vision, right eye     Discharge Instructions     Sent to ED for further evaluation    ED Prescriptions    None     Controlled Substance Prescriptions [place] Controlled Substance Registry consulted? Not Applicable   Lew DawesWieters,  C, [place]PA-C 06/06/18 1311

## 2018-06-06 NOTE — ED Triage Notes (Signed)
Pt c/o blurred vision to rt eye off and on since yesterday. Denies any s/sx's

## 2018-06-06 NOTE — ED Notes (Signed)
ED TO INPATIENT HANDOFF REPORT  ED Nurse Name and Phone #: Marisue Ivan [phone]  S Name/Age/Gender Thomas [name] 53 y.o. male Room/Bed: 016C/016C  Code Status   Code Status: Full Code  Home/SNF/Other Home Patient oriented to: self, place, time and situation Is this baseline? Yes   Triage Complete: Triage complete  Chief Complaint HTN; Blurred Vision  Triage Note Onset blurred vision one day ago in right eye. States takes blood pressure medication 3 times a day. Last dose last night at 1900. Seen at urgent care sent to the ED for evaluation of high blood pressure and blurred vision.     Allergies Allergies  Allergen Reactions  . Lisinopril Swelling    Facial swelling    Level of Care/Admitting Diagnosis ED Disposition    ED Disposition Condition Comment   Admit  Hospital Area: [facility] [[id number]]  Level of Care: Progressive [102]  Covid Evaluation: Screening Protocol (No Symptoms)  Diagnosis: Accelerated hypertension [454098]  Admitting Physician: Almon Hercules [[id number]]  Attending Physician: Almon Hercules [id number]  Estimated length of stay: past midnight tomorrow  Certification:: I certify this patient will need inpatient services for at least 2 midnights  PT Class (Do Not Modify): Inpatient [101]  PT Acc Code (Do Not Modify): Private [1]       B Medical/Surgery History Past Medical History:  Diagnosis Date  . Abnormal EKG   . Bipolar disorder (HCC)   . Cardiomegaly   . CKD (chronic kidney disease), stage II   . Family history of heart disease   . H/O medication noncompliance   . Hyperlipidemia   . Hypertension   . Stroke (HCC) 11/07/2015  . Tobacco abuse    Past Surgical History:  Procedure Laterality Date  . ANKLE FRACTURE SURGERY    . NM MYOCAR PERF WALL MOTION  06/28/2011   protocol Bruce, normal perfusion nin all regions, post stress EF 57%,, exercise cap  . TRANSTHORACIC ECHOCARDIOGRAM  06/28/2011   EF=55%, Proximal  septal thickening, borderline LA enlargement, boarderline aortic root dialation     A IV Location/Drains/Wounds Patient Lines/Drains/Airways Status   Active Line/Drains/Airways    Name:   Placement date:   Placement time:   Site:   Days:   Peripheral IV 06/06/18 Right Antecubital   06/06/18    1448    Antecubital   less than 1          Intake/Output Last 24 hours No intake or output data in the 24 hours ending 06/06/18 1610  Labs/Imaging Results for orders placed or performed during the hospital encounter of 06/06/18 (from the past 48 hour(s))  Protime-INR     Status: None   Collection Time: 06/06/18  1:35 PM  Result Value Ref Range   Prothrombin Time 14.9 11.4 - 15.2 seconds   INR 1.2 0.8 - 1.2    Comment: (NOTE) INR goal varies based on device and disease states. Performed at [facility], [address], [place], [place] [address]   APTT     Status: None   Collection Time: 06/06/18  1:35 PM  Result Value Ref Range   aPTT 30 24 - 36 seconds    Comment: Performed at [facility], [address], [place], [place] [address]  CBC     Status: Abnormal   Collection Time: 06/06/18  1:35 PM  Result Value Ref Range   WBC 3.6 (L) 4.0 - 10.5 K/uL   RBC 5.76 4.22 -  5.81 MIL/uL   Hemoglobin 16.4 13.0 - 17.0 g/dL   HCT 16.1 09.6 - 04.5 %   MCV 85.9 80.0 - 100.0 fL   MCH 28.5 26.0 - 34.0 pg   MCHC 33.1 30.0 - 36.0 g/dL   RDW 40.9 81.1 - 91.4 %   Platelets 181 150 - 400 K/uL   nRBC 0.0 0.0 - 0.2 %    Comment: Performed at [facility], [address], [place], [place] [address]  Differential     Status: Abnormal   Collection Time: 06/06/18  1:35 PM  Result Value Ref Range   Neutrophils Relative % 41 %   Neutro Abs 1.5 (L) 1.7 - 7.7 K/uL   Lymphocytes Relative 41 %   Lymphs Abs 1.5 0.7 - 4.0 K/uL   Monocytes Relative 12 %   Monocytes Absolute 0.4 0.1 - 1.0 K/uL   Eosinophils Relative 5 %   Eosinophils Absolute 0.2 0.0 - 0.5 K/uL   Basophils Relative 1 %    Basophils Absolute 0.0 0.0 - 0.1 K/uL   Immature Granulocytes 0 %   Abs Immature Granulocytes 0.01 0.00 - 0.07 K/uL    Comment: Performed at [facility], [address], [place], [place] [address]  Comprehensive metabolic panel     Status: Abnormal   Collection Time: 06/06/18  1:35 PM  Result Value Ref Range   Sodium 139 135 - 145 mmol/L   Potassium 4.2 3.5 - 5.1 mmol/L   Chloride 103 98 - 111 mmol/L   CO2 28 22 - 32 mmol/L   Glucose, Bld 98 70 - 99 mg/dL   BUN 16 6 - 20 mg/dL   Creatinine, Ser 8.65 (H) 0.61 - 1.24 mg/dL   Calcium 9.7 8.9 - 78.4 mg/dL   Total Protein 7.5 6.5 - 8.1 g/dL   Albumin 4.4 3.5 - 5.0 g/dL   AST 24 15 - 41 U/L   ALT 24 0 - 44 U/L   Alkaline Phosphatase 77 38 - 126 U/L   Total Bilirubin 1.3 (H) 0.3 - 1.2 mg/dL   GFR calc non Af Amer 36 (L) >60 mL/min   GFR calc Af Amer 41 (L) >60 mL/min   Anion gap 8 5 - 15    Comment: Performed at [facility], [address] [place], [place] [address]  I-stat chem 8, ED [facility] and [facility] only)     Status: Abnormal   Collection Time: 06/06/18  1:56 PM  Result Value Ref Range   Sodium 138 135 - 145 mmol/L   Potassium 4.6 3.5 - 5.1 mmol/L   Chloride 105 98 - 111 mmol/L   BUN 25 (H) 6 - 20 mg/dL   Creatinine, Ser 5.28 (H) 0.61 - 1.24 mg/dL   Glucose, Bld 90 70 - 99 mg/dL   Calcium, Ion 4.13 (L) 1.15 - 1.40 mmol/L   TCO2 28 22 - 32 mmol/L   Hemoglobin 17.0 13.0 - 17.0 g/dL   HCT 24.4 01.0 - 27.2 %  Troponin I - ONCE - STAT     Status: Abnormal   Collection Time: 06/06/18  2:44 PM  Result Value Ref Range   Troponin I 0.04 (HH) <0.03 ng/mL    Comment: CRITICAL RESULT CALLED TO, READ BACK BY AND VERIFIED WITH: L Oval Cavazos,RN 1552 06/06/2018 D BRADLEY Performed at [facility], [address], [place], [place] [address]   CBG monitoring, ED     Status: None   Collection Time: 06/06/18  2:53 PM  Result Value Ref Range   Glucose-Capillary 71 70 - 99 mg/dL   Ct Head Wo Contrast  Result Date:  06/06/2018 CLINICAL DATA:  Dizziness and headaches.  Hypertension. EXAM: CT HEAD WITHOUT CONTRAST TECHNIQUE: Contiguous axial images were obtained from the base of the skull through the vertex without intravenous contrast. COMPARISON:  Head CT August 28, 2017 and brain MRI August 29, 2017 FINDINGS: Brain: The ventricles are normal in size and configuration. There is no intracranial mass, hemorrhage, extra-axial fluid collection, or midline shift. There is slight small vessel disease in the centra semiovale bilaterally. There are prior lacunar infarcts in the anterior limb of each internal capsule and in the anterior right lentiform nucleus. There is a small prior infarct in the posterior limb of the right external capsule. There is a small focus of decreased attenuation in the inferior mid right centrum semiovale immediately adjacent to the right lateral ventricle which was not present previously and may represent a small new infarct in the right periventricular white matter. No other findings suggesting potential acute infarct evident. Vascular: There is no hyperdense vessel. There are foci of calcification in each carotid siphon region. Skull: Bony calvarium appears intact. Sinuses/Orbits: There is mucosal thickening involving several ethmoid air cells. Other visualized paranasal sinuses are clear. Orbits appear symmetric bilaterally. Other: Mastoid air cells are clear. IMPRESSION: Focal area of decreased attenuation in the inferior mid right centrum semiovale, immediately adjacent to the right lateral ventricle, best seen on axial slice 19 series 3, coronal slice 36 series 5, and sagittal slice 30 series 6. A small acute infarct in this area is of concern. No other findings suggesting potential acute infarct. Elsewhere, there is periventricular small vessel disease. Small basal ganglia region lacunar type infarcts, stable. No mass or hemorrhage. There are foci of arterial vascular calcification. There is mucosal  thickening in several ethmoid air cells. Electronically Signed   By: Bretta BangWilliam  Woodruff III M.D.   On: 06/06/2018 14:46    Pending Labs Unresulted Labs (From admission, onward)    Start     Ordered   06/07/18 0500  Hemoglobin A1c  Tomorrow morning,   R     06/06/18 1602   06/07/18 0500  Lipid panel  Tomorrow morning,   R    Comments:  Fasting    06/06/18 1602   06/06/18 1557  SARS Coronavirus 2 (CEPHEID - [facility] Hosp Order  (Asymptomatic Patients Labs)  Once,   R    Question:  Rule Out  Answer:  Yes   06/06/18 1557          Vitals/Pain Today's Vitals   06/06/18 1534 06/06/18 1545 06/06/18 1600 06/06/18 1604  BP: (!) 218/118 (!) 207/126 (!) 217/176   Pulse: 65 62 61   Resp: 16     Temp:      TempSrc:      SpO2: 97% 98% 98%   Weight:      Height:      PainSc:    0-No pain    Isolation Precautions No active isolations  Medications Medications   stroke: mapping our early stages of recovery book (has no administration in time range)  0.9 %  sodium chloride infusion (has no administration in time range)  acetaminophen (TYLENOL) tablet 650 mg (has no administration in time range)    Or  acetaminophen (TYLENOL) solution 650 mg (has no administration in time range)    Or  acetaminophen (TYLENOL) suppository  650 mg (has no administration in time range)  senna-docusate (Senokot-S) tablet 1 tablet (has no administration in time range)  labetalol (NORMODYNE) injection 10 mg (has no administration in time range)  sodium chloride flush (NS) 0.9 % injection 3 mL (3 mLs Intravenous Given 06/06/18 1516)  hydrALAZINE (APRESOLINE) tablet 50 mg (50 mg Oral Given 06/06/18 1425)  hydrALAZINE (APRESOLINE) injection 10 mg (10 mg Intravenous Given 06/06/18 1515)    Mobility walks Low fall risk   Focused Assessments Neuro Assessment Handoff:  Swallow screen pass? Yes    NIH Stroke Scale ( + Modified Stroke Scale Criteria)  Interval: Initial Level of  Consciousness (1a.)   : Alert, keenly responsive LOC Questions (1b. )   +: Answers both questions correctly Best Gaze (2. )  +: Normal Visual (3. )  +: No visual loss Facial Palsy (4. )    : Normal symmetrical movements Motor Arm, Left (5a. )   +: No drift Motor Arm, Right (5b. )   +: No drift Motor Leg, Left (6a. )   +: No drift Limb Ataxia (7. ): Absent Sensory (8. )   +: Normal, no sensory loss Best Language (9. )   +: No aphasia Dysarthria (10. ): Normal Extinction/Inattention (11.)   +: No Abnormality     Neuro Assessment: Exceptions to WDL Neuro Checks:   Initial (06/06/18 1404)  Last Documented NIHSS Modified Score:   Has TPA been given? No If patient is a Neuro Trauma and patient is going to OR before floor call report to 4N Charge nurse: [phone] or [phone]     R Recommendations: See Admitting Provider Note  Report given to:   Additional Notes: No active chest pain and denies headache. EDP updated wife.

## 2018-06-06 NOTE — ED Notes (Signed)
Patient is being transferred from the Urgent Care Center to the Emergency Department via wheelchair by staff. Patient is stable but in need of higher level of care due to hypertensive crisis and blurred vision like his last stroke. Patient is aware and verbalizes understanding of plan of care.  Vitals:   06/06/18 1258  BP: (!) 232/132  Pulse: (!) 49  Resp: 20  Temp: 98.1 F (36.7 C)  SpO2: 99%

## 2018-06-06 NOTE — Discharge Instructions (Addendum)
Sent to ED for further evaluation

## 2018-06-06 NOTE — ED Triage Notes (Signed)
Onset blurred vision one day ago in right eye. States takes blood pressure medication 3 times a day. Last dose last night at 1900. Seen at urgent care sent to the ED for evaluation of high blood pressure and blurred vision.

## 2018-06-06 NOTE — ED Provider Notes (Signed)
[facility] EMERGENCY DEPARTMENT Provider Note   CSN: [id number] Arrival date & time: 06/06/18  1314    History   Chief Complaint Chief Complaint  Patient presents with   Hypertension   Blurred Vision    HPI Thomas [name] is a 53 y.o. male.     The history is provided by the patient.  Hypertension  This is a chronic problem. The current episode started more than 1 week ago. The problem occurs daily. The problem has not changed since onset.Pertinent negatives include no chest pain, no abdominal pain, no headaches and no shortness of breath. Nothing aggravates the symptoms. Nothing relieves the symptoms. He has tried nothing for the symptoms. The treatment provided no relief.    Past Medical History:  Diagnosis Date   Abnormal EKG    Bipolar disorder (HCC)    Cardiomegaly    CKD (chronic kidney disease), stage II    Family history of heart disease    H/O medication noncompliance    Hyperlipidemia    Hypertension    Stroke (HCC) 11/07/2015   Tobacco abuse     Patient Active Problem List   Diagnosis Date Noted   Accelerated hypertension 06/06/2018   Stroke (cerebrum) (HCC) 08/29/2017   Stroke (HCC) 08/29/2017   CKD (chronic kidney disease), stage II    Bipolar disorder (HCC)    Altered mental status 02/24/2016   Urinary tract infection without hematuria 02/24/2016   Acute encephalopathy 02/24/2016   Chest pain 02/24/2016   History of stroke    Blurry vision, right eye 11/08/2015   Acute cerebrovascular accident (CVA) (HCC) 11/08/2015   Acute kidney injury superimposed on CKD (HCC) 11/07/2015   Hyperlipidemia 01/26/2014   TOBACCO ABUSE 04/19/2008   HYPERTENSION, BENIGN ESSENTIAL 01/22/2005    Past Surgical History:  Procedure Laterality Date   ANKLE FRACTURE SURGERY     NM MYOCAR PERF WALL MOTION  06/28/2011   protocol Bruce, normal perfusion nin all regions, post stress EF 57%,, exercise cap    TRANSTHORACIC ECHOCARDIOGRAM  06/28/2011   EF=55%, Proximal septal thickening, borderline LA enlargement, boarderline aortic root dialation        Home Medications    Prior to Admission medications   Medication Sig Start Date End Date Taking? Authorizing Provider  aspirin 325 MG tablet Take 325 mg by mouth daily.   Yes [provider]  atorvastatin (LIPITOR) 40 MG tablet Take 1 tablet (40 mg total) by mouth daily at 6 PM. 03/11/18  Yes Parke Poisson, MD  carvedilol (COREG) 6.25 MG tablet Take 1 tablet (6.25 mg total) by mouth 2 (two) times daily. 03/11/18  Yes Parke Poisson, MD  clopidogrel (PLAVIX) 75 MG tablet Take 1 tablet (75 mg total) by mouth daily. 03/11/18  Yes Parke Poisson, MD  doxazosin (CARDURA) 4 MG tablet Take 1 tablet (4 mg total) by mouth daily. 03/11/18  Yes Parke Poisson, MD  hydrALAZINE (APRESOLINE) 50 MG tablet Take daily as needed for BP systolic >160 03/11/18  Yes Parke Poisson, MD    Family History Family History  Problem Relation Age of Onset   Hypertension Mother    Diabetes Mother    Stroke Mother    Hypertension Father    Diabetes Father    Stroke Father    Hypertension Sister    Stroke Sister    Diabetes Sister    Hyperlipidemia Maternal Grandmother    Diabetes Maternal Grandmother    Asthma Daughter  Asthma Son     Social History Social History   Tobacco Use   Smoking status: Former Smoker    Packs/day: 0.25    Years: 15.00    Pack years: 3.75    Types: Cigarettes    Last attempt to quit: 11/06/2015    Years since quitting: 2.5   Smokeless tobacco: Never Used   Tobacco comment: quit in Juy 2015  Substance Use Topics   Alcohol use: Not Currently    Alcohol/week: 12.0 standard drinks    Types: 12 Cans of beer per week    Comment: 12 pack on weekends    Drug use: Yes    Types: Marijuana     Allergies   Lisinopril   Review of Systems Review of Systems  Constitutional: Negative  for chills and fever.  HENT: Negative for ear pain and sore throat.   Eyes: Positive for visual disturbance. Negative for pain.  Respiratory: Negative for cough and shortness of breath.   Cardiovascular: Negative for chest pain and palpitations.  Gastrointestinal: Negative for abdominal pain and vomiting.  Genitourinary: Negative for dysuria and hematuria.  Musculoskeletal: Negative for arthralgias and back pain.  Skin: Negative for color change and rash.  Neurological: Negative for seizures, syncope and headaches.  All other systems reviewed and are negative.    Physical Exam Updated Vital Signs  ED Triage Vitals  Enc Vitals Group     BP 06/06/18 1319 (!) 213/134     Pulse Rate 06/06/18 1319 (!) 50     Resp 06/06/18 1319 18     Temp 06/06/18 1319 98 F (36.7 C)     Temp Source 06/06/18 1319 Oral     SpO2 06/06/18 1319 98 %     Weight 06/06/18 1325 220 lb (99.8 kg)     Height 06/06/18 1325 6\' 3"  (1.905 m)     Head Circumference --      Peak Flow --      Pain Score 06/06/18 1325 0     Pain Loc --      Pain Edu? --      Excl. in GC? --     Physical Exam Vitals signs and nursing note reviewed.  Constitutional:      General: He is not in acute distress.    Appearance: He is well-developed. He is not ill-appearing.  HENT:     Head: Normocephalic and atraumatic.     Nose: Nose normal.     Mouth/Throat:     Mouth: Mucous membranes are moist.  Eyes:     Extraocular Movements: Extraocular movements intact.     Conjunctiva/sclera: Conjunctivae normal.     Pupils: Pupils are equal, round, and reactive to light.     Comments: 20/20 with readers and 50/20 without readers  Neck:     Musculoskeletal: Normal range of motion and neck supple.  Cardiovascular:     Rate and Rhythm: Normal rate and regular rhythm.     Pulses: Normal pulses.     Heart sounds: Normal heart sounds. No murmur.  Pulmonary:     Effort: Pulmonary effort is normal. No respiratory distress.     Breath  sounds: Normal breath sounds.  Abdominal:     General: Abdomen is flat.     Palpations: Abdomen is soft.     Tenderness: There is no abdominal tenderness.  Skin:    General: Skin is warm and dry.     Capillary Refill: Capillary refill takes less than 2 seconds.  Neurological:     General: No focal deficit present.     Mental Status: He is alert and oriented to person, place, and time.     Cranial Nerves: No cranial nerve deficit.     Sensory: No sensory deficit.     Motor: No weakness.     Coordination: Coordination normal.     Comments: No visual field deficit, 5+ out of 5 strength throughout, normal sensation, no drift, normal finger-to-nose finger  Psychiatric:        Mood and Affect: Mood normal.      ED Treatments / Results  Labs (all labs ordered are listed, but only abnormal results are displayed) Labs Reviewed  CBC - Abnormal; Notable for the following components:      Result Value   WBC 3.6 (*)    All other components within normal limits  DIFFERENTIAL - Abnormal; Notable for the following components:   Neutro Abs 1.5 (*)    All other components within normal limits  COMPREHENSIVE METABOLIC PANEL - Abnormal; Notable for the following components:   Creatinine, Ser 2.07 (*)    Total Bilirubin 1.3 (*)    GFR calc non Af Amer 36 (*)    GFR calc Af Amer 41 (*)    All other components within normal limits  TROPONIN I - Abnormal; Notable for the following components:   Troponin I 0.04 (*)    All other components within normal limits  I-STAT CHEM 8, ED - Abnormal; Notable for the following components:   BUN 25 (*)    Creatinine, Ser 2.10 (*)    Calcium, Ion 1.09 (*)    All other components within normal limits  SARS CORONAVIRUS 2 (HOSPITAL ORDER, PERFORMED IN [facility])  PROTIME-INR  APTT  CBG MONITORING, ED    EKG EKG Interpretation  Date/Time:  Friday Jun 06 2018 13:37:53 EDT Ventricular Rate:  49 PR Interval:  194 QRS Duration: 106 QT  Interval:  462 QTC Calculation: 417 R Axis:   -10 Text Interpretation:  Marked sinus bradycardia Possible Left atrial enlargement Incomplete right bundle branch block Left ventricular hypertrophy with repolarization abnormality Cannot rule out Septal infarct , age undetermined Abnormal ECG Confirmed by Virgina Norfolk [phone]) on 06/06/2018 2:17:21 PM   Radiology Ct Head Wo Contrast  Result Date: 06/06/2018 CLINICAL DATA:  Dizziness and headaches.  Hypertension. EXAM: CT HEAD WITHOUT CONTRAST TECHNIQUE: Contiguous axial images were obtained from the base of the skull through the vertex without intravenous contrast. COMPARISON:  Head CT August 28, 2017 and brain MRI August 29, 2017 FINDINGS: Brain: The ventricles are normal in size and configuration. There is no intracranial mass, hemorrhage, extra-axial fluid collection, or midline shift. There is slight small vessel disease in the centra semiovale bilaterally. There are prior lacunar infarcts in the anterior limb of each internal capsule and in the anterior right lentiform nucleus. There is a small prior infarct in the posterior limb of the right external capsule. There is a small focus of decreased attenuation in the inferior mid right centrum semiovale immediately adjacent to the right lateral ventricle which was not present previously and may represent a small new infarct in the right periventricular white matter. No other findings suggesting potential acute infarct evident. Vascular: There is no hyperdense vessel. There are foci of calcification in each carotid siphon region. Skull: Bony calvarium appears intact. Sinuses/Orbits: There is mucosal thickening involving several ethmoid air cells. Other visualized paranasal sinuses are clear. Orbits appear symmetric bilaterally.  Other: Mastoid air cells are clear. IMPRESSION: Focal area of decreased attenuation in the inferior mid right centrum semiovale, immediately adjacent to the right lateral ventricle, best  seen on axial slice 19 series 3, coronal slice 36 series 5, and sagittal slice 30 series 6. A small acute infarct in this area is of concern. No other findings suggesting potential acute infarct. Elsewhere, there is periventricular small vessel disease. Small basal ganglia region lacunar type infarcts, stable. No mass or hemorrhage. There are foci of arterial vascular calcification. There is mucosal thickening in several ethmoid air cells. Electronically Signed   By: Bretta Bang III M.D.   On: 06/06/2018 14:46    Procedures .Critical Care Performed by: Virgina Norfolk, DO Authorized by: Virgina Norfolk, DO   Critical care provider statement:    Critical care time (minutes):  45   Critical care was necessary to treat or prevent imminent or life-threatening deterioration of the following conditions:  Cardiac failure   Critical care was time spent personally by me on the following activities:  Blood draw for specimens, development of treatment plan with patient or surrogate, discussions with consultants, evaluation of patient's response to treatment, obtaining history from patient or surrogate, ordering and performing treatments and interventions, ordering and review of laboratory studies, ordering and review of radiographic studies, pulse oximetry, re-evaluation of patient's condition, review of old charts, discussions with primary provider and examination of patient   I assumed direction of critical care for this patient from another provider in my specialty: no     (including critical care time)  Medications Ordered in ED Medications   stroke: mapping our early stages of recovery book (has no administration in time range)  0.9 %  sodium chloride infusion (has no administration in time range)  acetaminophen (TYLENOL) tablet 650 mg (has no administration in time range)    Or  acetaminophen (TYLENOL) solution 650 mg (has no administration in time range)    Or  acetaminophen (TYLENOL)  suppository 650 mg (has no administration in time range)  senna-docusate (Senokot-S) tablet 1 tablet (has no administration in time range)  labetalol (NORMODYNE) injection 10 mg (has no administration in time range)  sodium chloride flush (NS) 0.9 % injection 3 mL (3 mLs Intravenous Given 06/06/18 1516)  hydrALAZINE (APRESOLINE) tablet 50 mg (50 mg Oral Given 06/06/18 1425)  hydrALAZINE (APRESOLINE) injection 10 mg (10 mg Intravenous Given 06/06/18 1515)     Initial Impression / Assessment and Plan / ED Course  I have reviewed the triage vital signs and the nursing notes.  Pertinent labs & imaging results that were available during my care of the patient were reviewed by me and considered in my medical decision making (see chart for details).     Thomas [name] is a 53 year old male with history of hypertension, stroke who presents the ED with hypertension, blurred vision.  Patient with significant hypertension with blood pressure 240/150 but otherwise normal vitals.  Patient states his blood pressure is usually greater than 220 daily.  Patient noticed some blurry vision in his right eye that was worse yesterday.  Has 20/20 vision bilaterally with his readers on.  Has 50/20 vision bilaterally without readers on.  No obvious visual field deficit.  No obvious a fair pupillary defect.  Otherwise patient neurologically intact.  Concern for hypertensive emergency given extreme hypertension.  EKG shows sinus rhythm.  T wave versions throughout which are seen from prior EKG.  Patient denies any chest pain, shortness of breath.  CT scan of the brain shows possible acute infarct.  Neurology recommends MRI for further evaluation.  Patient was given IV hydralazine and p.o. hydralazine with improvement of his blood pressure.  Blood pressure now 200/120.  Will hold off on any lowering of blood pressure in case this is an acute infarct.  This is per neurology recommendations.  Troponin is also elevated.  Patient  with creatinine at baseline.  Otherwise no significant anemia, electrolyte abnormality.  Patient likely with hypertensive emergency due to uncontrolled hypertension causing elevation in his troponin.  Possibly stroke as well.  Patient to be admitted to medicine for further care.  MRI has been ordered.  Hemodynamically stable throughout my care. If MRI is negative for stroke, more aggressive BP control can be attained.  This chart was dictated using voice recognition software.  Despite best efforts to proofread,  errors can occur which can change the documentation meaning.   Final Clinical Impressions(s) / ED Diagnoses   Final diagnoses:  Hypertensive emergency    ED Discharge Orders    None       Virgina Norfolk, DO 06/06/18 1616

## 2018-06-07 ENCOUNTER — Inpatient Hospital Stay (HOSPITAL_COMMUNITY): Payer: Medicaid Other

## 2018-06-07 DIAGNOSIS — N183 Chronic kidney disease, stage 3 (moderate): Secondary | ICD-10-CM

## 2018-06-07 DIAGNOSIS — I639 Cerebral infarction, unspecified: Secondary | ICD-10-CM

## 2018-06-07 DIAGNOSIS — F1210 Cannabis abuse, uncomplicated: Secondary | ICD-10-CM

## 2018-06-07 DIAGNOSIS — I161 Hypertensive emergency: Secondary | ICD-10-CM

## 2018-06-07 DIAGNOSIS — I6330 Cerebral infarction due to thrombosis of unspecified cerebral artery: Secondary | ICD-10-CM

## 2018-06-07 DIAGNOSIS — F1410 Cocaine abuse, uncomplicated: Secondary | ICD-10-CM

## 2018-06-07 LAB — RAPID URINE DRUG SCREEN, HOSP PERFORMED
Amphetamines: NOT DETECTED
Barbiturates: NOT DETECTED
Benzodiazepines: NOT DETECTED
Cocaine: POSITIVE — AB
Opiates: NOT DETECTED
Tetrahydrocannabinol: POSITIVE — AB

## 2018-06-07 LAB — ECHOCARDIOGRAM COMPLETE
Height: 75 in
Weight: 3520 oz

## 2018-06-07 LAB — LIPID PANEL
Cholesterol: 188 mg/dL (ref 0–200)
HDL: 38 mg/dL — ABNORMAL LOW (ref >40)
LDL Cholesterol: 136 mg/dL — ABNORMAL HIGH (ref 0–99)
Total CHOL/HDL Ratio: 4.9 RATIO
Triglycerides: 70 mg/dL (ref <150)
VLDL: 14 mg/dL (ref 0–40)

## 2018-06-07 LAB — HEMOGLOBIN A1C
Hgb A1c MFr Bld: 5.1 % (ref 4.8–5.6)
Mean Plasma Glucose: 99.67 mg/dL

## 2018-06-07 MED ORDER — HYDRALAZINE HCL 20 MG/ML IJ SOLN
10.0000 mg | Freq: Once | INTRAMUSCULAR | Status: AC
  Administered 2018-06-07: 10 mg via INTRAVENOUS
  Filled 2018-06-07: qty 1

## 2018-06-07 MED ORDER — HYDRALAZINE HCL 50 MG PO TABS: 50.0000 mg | ORAL_TABLET | Freq: Three times a day (TID) | ORAL | 1 refills | Status: DC

## 2018-06-07 MED ORDER — ASPIRIN 81 MG PO TBEC: 81.0000 mg | DELAYED_RELEASE_TABLET | Freq: Every day | ORAL | 1 refills | Status: DC

## 2018-06-07 MED ORDER — ASPIRIN EC 81 MG PO TBEC
81.0000 mg | DELAYED_RELEASE_TABLET | Freq: Every day | ORAL | Status: DC
  Administered 2018-06-07: 81 mg via ORAL
  Filled 2018-06-07: qty 1

## 2018-06-07 MED ORDER — CARVEDILOL 6.25 MG PO TABS
6.2500 mg | ORAL_TABLET | Freq: Two times a day (BID) | ORAL | Status: DC
  Administered 2018-06-07: 6.25 mg via ORAL
  Filled 2018-06-07: qty 1

## 2018-06-07 MED ORDER — ATORVASTATIN CALCIUM 80 MG PO TABS: 80.0000 mg | ORAL_TABLET | Freq: Every day | ORAL | Status: DC

## 2018-06-07 MED ORDER — HYDRALAZINE HCL 50 MG PO TABS
50.0000 mg | ORAL_TABLET | Freq: Three times a day (TID) | ORAL | Status: DC
  Administered 2018-06-07: 50 mg via ORAL
  Filled 2018-06-07: qty 1

## 2018-06-07 MED ORDER — ATORVASTATIN CALCIUM 80 MG PO TABS: 80.0000 mg | ORAL_TABLET | Freq: Every day | ORAL | 1 refills | Status: DC

## 2018-06-07 MED ORDER — ATORVASTATIN CALCIUM 40 MG PO TABS: 40.0000 mg | ORAL_TABLET | Freq: Every day | ORAL | Status: DC

## 2018-06-07 MED ORDER — DOXAZOSIN MESYLATE 4 MG PO TABS
4.0000 mg | ORAL_TABLET | Freq: Every day | ORAL | Status: DC
  Administered 2018-06-07: 4 mg via ORAL
  Filled 2018-06-07: qty 1

## 2018-06-07 MED ORDER — CLOPIDOGREL BISULFATE 75 MG PO TABS
75.0000 mg | ORAL_TABLET | Freq: Every day | ORAL | Status: DC
  Administered 2018-06-07: 10:00:00 75 mg via ORAL
  Filled 2018-06-07: qty 1

## 2018-06-07 NOTE — Progress Notes (Signed)
VASCULAR LAB PRELIMINARY  PRELIMINARY  PRELIMINARY  PRELIMINARY  Carotid duplex completed.    Preliminary report:  See CV proc for preliminary results.  Janyce Ellinger, RVT 06/07/2018, 12:43 PM

## 2018-06-07 NOTE — Progress Notes (Signed)
STROKE TEAM PROGRESS NOTE   SUBJECTIVE (INTERVAL HISTORY) No family is at the bedside.  Patient sitting in bed, stated that he is blurry vision is getting better although not completely normal yet.  He admitted that he is still smoking but willing to quit.  OBJECTIVE Vitals:   06/07/18 0353 06/07/18 0547 06/07/18 0548 06/07/18 0823  BP: (!) 203/123 (!) 179/111 (!) 179/117 (!) 209/137  Pulse: (!) 58 (!) 57 (!) 55 63  Resp: 18   20  Temp: 98.5 F (36.9 C)   98.6 F (37 C)  TempSrc: Oral   Oral  SpO2: 97%   100%  Weight:      Height:        CBC:  Recent Labs  Lab 06/06/18 1335 06/06/18 1356  WBC 3.6*  --   NEUTROABS 1.5*  --   HGB 16.4 17.0  HCT 49.5 50.0  MCV 85.9  --   PLT 181  --     Basic Metabolic Panel:  Recent Labs  Lab 06/06/18 1335 06/06/18 1356  NA 139 138  K 4.2 4.6  CL 103 105  CO2 28  --   GLUCOSE 98 90  BUN 16 25*  CREATININE 2.07* 2.10*  CALCIUM 9.7  --     Lipid Panel:     Component Value Date/Time   CHOL 188 06/07/2018 0452   TRIG 70 06/07/2018 0452   HDL 38 (L) 06/07/2018 0452   CHOLHDL 4.9 06/07/2018 0452   VLDL 14 06/07/2018 0452   LDLCALC 136 (H) 06/07/2018 0452   HgbA1c:  Lab Results  Component Value Date   HGBA1C 5.1 06/07/2018   Urine Drug Screen:     Component Value Date/Time   LABOPIA NONE DETECTED 08/29/2017 2258   COCAINSCRNUR NONE DETECTED 08/29/2017 2258   LABBENZ NONE DETECTED 08/29/2017 2258   AMPHETMU NONE DETECTED 08/29/2017 2258   THCU POSITIVE (A) 08/29/2017 2258   LABBARB NONE DETECTED 08/29/2017 2258    Alcohol Level     Component Value Date/Time   ETH <5 06/07/2016 1528    IMAGING  Ct Head Wo Contrast 06/06/2018 IMPRESSION:  Focal area of decreased attenuation in the inferior mid right centrum semiovale, immediately adjacent to the right lateral ventricle, best seen on axial slice 19 series 3, coronal slice 36 series 5, and sagittal slice 30 series 6. A small acute infarct in this area is of  concern.  No other findings suggesting potential acute infarct.  Elsewhere, there is periventricular small vessel disease.  Small basal ganglia region lacunar type infarcts, stable.  No mass or hemorrhage. There are foci of arterial vascular calcification. There is mucosal thickening in several ethmoid air cells.   Mr Brain Wo Contrast (neuro Protocol) 06/06/2018 IMPRESSION:  1. 8 mm acute ischemic nonhemorrhagic right cerebellar infarct.  2. Additional 4 mm focus of diffusion abnormality involving the posterior left corona radiata, also consistent with an acute to early subacute small vessel ischemic infarct.  3. Underlying chronic microvascular ischemic disease with multiple remote lacunar infarcts as above.   Mr Maxine GlennMra Head Wo Contrast 06/07/2018 IMPRESSION:  No intracranial arterial occlusion or high-grade stenosis.   Transthoracic Echocardiogram   1. The left ventricle has normal systolic function, with an ejection fraction of 55-60%. The cavity size was normal. There is severe asymmetric left ventricular hypertrophy and moderate LVH. No evidence of left ventricular regional wall motion  abnormalities.  2. The right ventricle has normal systolic function. The cavity was normal. There is no increase  in right ventricular wall thickness.  3. The mitral valve is degenerative. Moderate calcification of the anterior mitral valve leaflet.  4. The aortic valve is tricuspid. Moderate sclerosis of the aortic valve. Aortic valve regurgitation is mild by color flow Doppler.  5. There is mild dilatation of the aortic root, at the level of the sinuses of Valsalva and of the ascending aorta measuring 40 mm and of the ascending aorta measuring 71mm.  Bilateral Carotid Dopplers  Right Carotid: The extracranial vessels were near-normal with only minimal wall  thickening or plaque.  Left Carotid: The extracranial vessels were near-normal with only minimal wall  thickening or plaque.  Vertebrals:   Bilateral vertebral arteries demonstrate antegrade flow. Subclavians: Normal flow hemodynamics were seen in bilateral subclavian arteries.   EKG - SB rate 49 BPM. (See cardiology reading for complete details)   PHYSICAL EXAM  Temp:  [97.7 F (36.5 C)-98.9 F (37.2 C)] 98.7 F (37.1 C) (05/16 1234) Pulse Rate:  [52-63] 58 (05/16 1234) Resp:  [16-20] 18 (05/16 1234) BP: (160-223)/(104-137) 173/111 (05/16 1611) SpO2:  [96 %-100 %] 100 % (05/16 1234)  General - Well nourished, well developed, in no apparent distress.  Ophthalmologic - fundi not visualized due to noncooperation.  Cardiovascular - Regular rate and rhythm.  Mental Status -  Level of arousal and orientation to time, place, and person were intact. Language including expression, naming, repetition, comprehension was assessed and found intact. Fund of Knowledge was assessed and was intact.  Cranial Nerves II - XII - II - Visual field intact OU. III, IV, VI - Extraocular movements intact. V - Facial sensation intact bilaterally. VII - Facial movement intact bilaterally. VIII - Hearing & vestibular intact bilaterally. X - Palate elevates symmetrically. XI - Chin turning & shoulder shrug intact bilaterally. XII - Tongue protrusion intact.  Motor Strength - The patient's strength was normal in all extremities and pronator drift was absent.  Bulk was normal and fasciculations were absent.   Motor Tone - Muscle tone was assessed at the neck and appendages and was normal.  Reflexes - The patient's reflexes were symmetrical in all extremities and he had no pathological reflexes.  Sensory - Light touch, temperature/pinprick were assessed and were symmetrical.    Coordination - The patient had normal movements in the hands and feet with no ataxia or dysmetria.  Tremor was absent.  Gait and Station - deferred.  ASSESSMENT/PLAN Thomas [name] is a 53 y.o. male with history of hypertension, CVA, hyperlipidemia, hx of  medication non compliance, bipolar disorder, CKD, tobacco abuse and occasional marijuana use presenting with blurring of vision, elevated BP and mild fogginess. He did not receive IV tPA due to late presentation (>4.5 hours from time of onset).  Stroke: right cerebellar and posterior left corona radiata infarcts - small vessel disease and uncontrolled hypertension vs embolic - unknown etiology.  CT head - Small basal ganglia region lacunar type infarcts, stable.  MRI head - 8 mm acute ischemic nonhemorrhagic right cerebellar infarct. 4 mm focus of diffusion abnormality involving the posterior left corona radiata.  MRA head - No intracranial arterial occlusion or high-grade stenosis.  Carotid Doppler unremarkable  2D Echo EF 55 to 60%  LDL - 136  HgbA1c - 5.1  [employer] Virus 2 - negative  UDS - positive for cocaine and THC  VTE prophylaxis - SCDs  Diet  - Heart healthy withthin liquids.  aspirin 325 mg daily and clopidogrel 75 mg daily prior to admission,  now on aspirin 81 mg daily and clopidogrel 75 mg daily.  Continue DAPT on discharge  Patient counseled to be compliant with his antithrombotic medications  Ongoing aggressive stroke risk factor management  Therapy recommendations:  pending  Disposition:  Pending  Cocaine abuse  UDS showed positive for cocaine  Cocaine cessation education provided  Patient is willing to quit  History of stroke  10/2014 MRI showed mid brain infarct, and old bilateral cerebellum infarcts.  MRA head and neck negative.  EF 60 to 65%, discharged with aspirin and Lipitor.  02/2016 admitted for right-sided weakness and numbness.  MRI negative and MRA unremarkable.  Carotid Doppler negative.  EF 60 to 65%.  LDL 130 and A1c 5.0.  UDS positive for THC.  Discharged with aspirin  05/2016 admitted for left facial droop and left-sided weakness.  CT negative.  UDS showed possible THC.  Consider functional presentation.  08/2017 admitted for  left-sided weakness, numbness and difficulty walking.  CT negative.  MRI showed right pontine small infarct.  MRA negative.  LDL 88 and A1c 5.1.  EF 60 to 65%.  Carotid Doppler negative.  UDS positive THC.  Discharged with aspirin and Plavix and Lipitor 40  Hypertension  BP still high . Permissive hypertension (OK if < 220/120) but gradually normalize in 2-3 days . Long-term BP goal normotensive  Hyperlipidemia  Lipid lowering medication PTA:  Lipitor 40 mg daily  LDL 136, goal < 70  Current lipid lowering medication: Lipitor 80 mg daily  Continue statin at discharge  Tobacco abuse  Current smoker  Smoking cessation counseling provided  Pt is willing to quit  Other Stroke Risk Factors  UDS showed positive THC  He reports previous alcohol use of about 12.0 standard drinks of alcohol per week.  Family hx stroke (mother, father and sister)  Hx of medication non compliance  Other Active Problems  CKD stage III- creatinine - 2.07->2.10   Hospital day # 1  Neurology will sign off. Please call with questions. Pt will follow up with stroke clinic NP at [facility] in about 4 weeks. Thanks for the consult.  Marvel Plan, MD PhD Stroke Neurology 06/07/2018 5:19 PM    To contact Stroke Continuity provider, please refer to [email or web address]. After hours, contact General Neurology

## 2018-06-07 NOTE — Progress Notes (Signed)
  Echocardiogram 2D Echocardiogram has been performed.  Thomas [name] 06/07/2018, 9:21 AM

## 2018-06-07 NOTE — Progress Notes (Signed)
Nurse went over discharge with patient. Patient verbalized understanding of discharge. All questions and concerns addressed. Discharging home with all belongings. Taken down in a wheelchair.

## 2018-06-07 NOTE — Discharge Instructions (Signed)
Follow with Thomas [name], Edwin, MD in 5-7 days Follow   Please get a complete blood count and chemistry panel checked by your Primary MD at your next visit, and again as instructed by your Primary MD. Please get your medications reviewed and adjusted by your Primary MD.  Please request your Primary MD to go over all Hospital Tests and Procedure/Radiological results at the follow up, please get all Hospital records sent to your Prim MD by signing hospital release before you go home.  In some cases, there will be blood work, cultures and biopsy results pending at the time of your discharge. Please request that your primary care M.D. goes through all the records of your hospital data and follows up on these results.  If you had Pneumonia of Lung problems at the Hospital: Please get a 2 view Chest X ray done in 6-8 weeks after hospital discharge or sooner if instructed by your Primary MD.  If you have Congestive Heart Failure: Please call your Cardiologist or Primary MD anytime you have any of the following symptoms:  1) 3 pound weight gain in 24 hours or 5 pounds in 1 week  2) shortness of breath, with or without a dry hacking cough  3) swelling in the hands, feet or stomach  4) if you have to sleep on extra pillows at night in order to breathe  Follow cardiac low salt diet and 1.5 lit/day fluid restriction.  If you have diabetes Accuchecks 4 times/day, Once in AM empty stomach and then before each meal. Log in all results and show them to your primary doctor at your next visit. If any glucose reading is under 80 or above 300 call your primary MD immediately.  If you have Seizure/Convulsions/Epilepsy: Please do not drive, operate heavy machinery, participate in activities at heights or participate in high speed sports until you have seen by Primary MD or a Neurologist and advised to do so again.  If you had Gastrointestinal Bleeding: Please ask your Primary MD to check a complete blood count  within one week of discharge or at your next visit. Your endoscopic/colonoscopic biopsies that are pending at the time of discharge, will also need to followed by your Primary MD.  Get Medicines reviewed and adjusted. Please take all your medications with you for your next visit with your Primary MD  Please request your Primary MD to go over all hospital tests and procedure/radiological results at the follow up, please ask your Primary MD to get all Hospital records sent to his/her office.  If you experience worsening of your admission symptoms, develop shortness of breath, life threatening emergency, suicidal or homicidal thoughts you must seek medical attention immediately by calling 911 or calling your MD immediately  if symptoms less severe.  You must read complete instructions/literature along with all the possible adverse reactions/side effects for all the Medicines you take and that have been prescribed to you. Take any new Medicines after you have completely understood and accpet all the possible adverse reactions/side effects.   Do not drive or operate heavy machinery when taking Pain medications.   Do not take more than prescribed Pain, Sleep and Anxiety Medications  Special Instructions: If you have smoked or chewed Tobacco  in the last 2 yrs please stop smoking, stop any regular Alcohol  and or any Recreational drug use.  Wear Seat belts while driving.  Please note You were cared for by a hospitalist during your hospital stay. If you have any questions about  your discharge medications or the care you received while you were in the hospital after you are discharged, you can call the unit and asked to speak with the hospitalist on call if the hospitalist that took care of you is not available. Once you are discharged, your primary care physician will handle any further medical issues. Please note that NO REFILLS for any discharge medications will be authorized once you are discharged,  as it is imperative that you return to your primary care physician (or establish a relationship with a primary care physician if you do not have one) for your aftercare needs so that they can reassess your need for medications and monitor your lab values.  You can reach the hospitalist office at phone [phone] or fax [phone]   If you do not have a primary care physician, you can call [phone] for a physician referral.  Activity: As tolerated with Full fall precautions use walker/cane & assistance as needed    Diet: low sodium  Disposition Home

## 2018-06-07 NOTE — Discharge Summary (Signed)
Physician Discharge Summary  Thomas LibmanSteven L Wilford ZOX:[id number] DOB: 10/02/1965 DOA: 06/06/2018  PCP: Fleet ContrasAvbuere, Edwin, MD  Admit date: 06/06/2018 Discharge date: 06/07/2018  Admitted From: home Disposition:  home  Recommendations for Outpatient Follow-up:  1. Follow up with PCP in 1-2 weeks 2. Follow up with Dr. Pearlean BrownieSethi in 1-2 months   Home Health: none  Equipment/Devices: none  Discharge Condition: stable CODE STATUS: Full code Diet recommendation: low sodium  HPI: Per admitting MD, Thomas [name] is a 53 y.o. male with history of hypertension, CVA without residual deficits, hyperlipidemia, CKD 3, bipolar disorder, tobacco use disorder and marijuana use disorder presenting with blurry vision mainly in the right eye since yesterday.  He stated that his vision has been blurry on and off since yesterday.  This started while mowing grass in his yard.  He denies trauma.  He says his vision is better when he puts on his reading glass.  He feels he can see well now.  He denies headache, double vision, lacrimation, burning, focal weakness, numbness or tingling, chest pain, dyspnea, palpitation, nausea, vomiting, photophobia, phonophobia, URI symptoms, fever, chills or neck stiffness.  He reports good compliance with his blood pressure medication.  He says his last dose was about noon today. He smokes about 2 to 3 cigarettes a day.  He also admits to marijuana use.  He denies cocaine or other recreational drug use.  Reports rare alcohol use. In ED, SBP elevated to 247.  DBP as high as 147.  Started on PRN hydralazine.  Blood pressure improved to 207/126.  CBC not impressive.  CMP significant for creatinine to 2.07 (baseline 1.6-2.0).  CBG 71.  Troponin  0.04.  PT/INR 14.9/1.2.  EKG reviewed by me with sinus bradycardia to 50, TWI in inferior and lateral leads which seems to be present on his previous EKG about 5 months ago.  CT head without contrast concerning for small acute infarct in the inferior mid  right centrum semiovale, and old basal ganglia infarcts.  Per EDP, neurology, Dr. Petra KubaKilpatrick consulted and recommended MRI head, admission and BP goal less than 220/120.  Patient was given hydralazine x3.  COVID-19 test was pending.  Hospital Course: Acute stroke -MRI on admission showed an 8 mm acute ischemic nonhemorrhagic right cerebellar infarct, as well as an additional 4 mm focus of diffusion abnormality involving the posterior left corona radiata, consistent with acute to early subacute small vessel ischemic infarct.  Neurology was consulted and followed patient while hospitalized.  He underwent full work-up including an MRA of the brain without any significant intracranial arterial occlusion or high-grade stenosis.  Carotid ultrasound without significant plaque buildup.  2D echo with normal EF 55-60%, as well as LVH.  Lipid panel showed an LDL of 136 and his home statin dose has been increased.  Hemoglobin A1c was 5.1.  He was severely hypertensive on admission and it is believed that these lacunar strokes are related to that.  Patient tells me that he was out in the sun mowing the yard and felt that this might have contributed.  He is to be continued on dual antiplatelet therapy with aspirin and Plavix.  His blood pressure regimen was somewhat difficult to control but it appears that his hydralazine was PRN, this was changed to scheduled.  He was advised to follow-up with PCP within a week for further adjustment of his blood pressure medications. Chronic kidney disease stage III-Baseline creatinine ranging between 1.6-2.0, currently at baseline.  Strongly advised regarding blood pressure  control is main prevention for this Mildly elevated troponin - Likely demand ischemia and delayed clearance.  EKG no significant change from prior.  Patient without cardiopulmonary symptoms.  Bipolar disorder: Stable.  No medication at home. Tobacco use disorder: Reports smoking about 2 to 3 cigarettes a day.   Counseled for cessation Polysubstance use disorder -Cessation counseling given.  UDS also positive for marijuana and cocaine  Discharge Diagnoses:  Active Problems:   Accelerated hypertension   Cerebral thrombosis with cerebral infarction     Discharge Instructions   Allergies as of 06/07/2018      Reactions   Lisinopril Swelling   Facial swelling      Medication List    STOP taking these medications   aspirin 325 MG tablet Replaced by:  aspirin 81 MG EC tablet     TAKE these medications   aspirin 81 MG EC tablet Take 1 tablet (81 mg total) by mouth daily. Start taking on:  Jun 08, 2018 Replaces:  aspirin 325 MG tablet   atorvastatin 80 MG tablet Commonly known as:  LIPITOR Take 1 tablet (80 mg total) by mouth daily at 6 PM. What changed:    medication strength  how much to take   carvedilol 6.25 MG tablet Commonly known as:  COREG Take 1 tablet (6.25 mg total) by mouth 2 (two) times daily.   clopidogrel 75 MG tablet Commonly known as:  PLAVIX Take 1 tablet (75 mg total) by mouth daily.   doxazosin 4 MG tablet Commonly known as:  CARDURA Take 1 tablet (4 mg total) by mouth daily.   hydrALAZINE 50 MG tablet Commonly known as:  APRESOLINE Take 1 tablet (50 mg total) by mouth 3 (three) times daily. What changed:    how much to take  how to take this  when to take this  additional instructions      Follow-up Information    Fleet Contras, MD. Schedule an appointment as soon as possible for a visit in 1 week(s).   Specialty:  Internal Medicine Contact information: [address] [place] [address] [phone]        Micki Riley, MD. Schedule an appointment as soon as possible for a visit in 4 week(s).   Specialties:  Neurology, Radiology Contact information: [address] [place] [address] [phone]           Consultations:  Neurology   Procedures/Studies:  2D echo  FINDINGS  Left Ventricle:  The left ventricle has normal systolic function, with an ejection fraction of 55-60%. The cavity size was normal. There is severe asymmetric left ventricular hypertrophy. Left ventricular diastolic Doppler parameters are indeterminate.  Normal left ventricular filling pressures No evidence of left ventricular regional wall motion abnormalities..   Ct Head Wo Contrast  Result Date: 06/06/2018 CLINICAL DATA:  Dizziness and headaches.  Hypertension. EXAM: CT HEAD WITHOUT CONTRAST TECHNIQUE: Contiguous axial images were obtained from the base of the skull through the vertex without intravenous contrast. COMPARISON:  Head CT August 28, 2017 and brain MRI August 29, 2017 FINDINGS: Brain: The ventricles are normal in size and configuration. There is no intracranial mass, hemorrhage, extra-axial fluid collection, or midline shift. There is slight small vessel disease in the centra semiovale bilaterally. There are prior lacunar infarcts in the anterior limb of each internal capsule and in the anterior right lentiform nucleus. There is a small prior infarct in the posterior limb of the right external capsule. There is a small focus of  decreased attenuation in the inferior mid right centrum semiovale immediately adjacent to the right lateral ventricle which was not present previously and may represent a small new infarct in the right periventricular white matter. No other findings suggesting potential acute infarct evident. Vascular: There is no hyperdense vessel. There are foci of calcification in each carotid siphon region. Skull: Bony calvarium appears intact. Sinuses/Orbits: There is mucosal thickening involving several ethmoid air cells. Other visualized paranasal sinuses are clear. Orbits appear symmetric bilaterally. Other: Mastoid air cells are clear. IMPRESSION: Focal area of decreased attenuation in the inferior mid right centrum semiovale, immediately adjacent to the right lateral ventricle, best seen on axial  slice 19 series 3, coronal slice 36 series 5, and sagittal slice 30 series 6. A small acute infarct in this area is of concern. No other findings suggesting potential acute infarct. Elsewhere, there is periventricular small vessel disease. Small basal ganglia region lacunar type infarcts, stable. No mass or hemorrhage. There are foci of arterial vascular calcification. There is mucosal thickening in several ethmoid air cells. Electronically Signed   By: Bretta Bang III M.D.   On: 06/06/2018 14:46   Mr Brain Wo Contrast (neuro Protocol)  Result Date: 06/06/2018 CLINICAL DATA:  Initial evaluation for acute blurry vision. EXAM: MRI HEAD WITHOUT CONTRAST TECHNIQUE: Multiplanar, multiecho pulse sequences of the brain and surrounding structures were obtained without intravenous contrast. COMPARISON:  Prior CT from earlier the same day as well as previous MRI from 08/29/2017. FINDINGS: Brain: Generalized age-related cerebral atrophy. Underlying mild chronic microvascular ischemic disease present within the periventricular deep white matter both cerebral hemispheres. Multiple chronic remote lacunar infarcts seen involving the right basal ganglia, right splenium, pons, and bilateral cerebellum. 8 mm acute ischemic nonhemorrhagic infarct present within the right cerebellum (series 3, image 15). No associated mass effect. Additional faint 4 mm focus of diffusion abnormality involving the posterior left corona radiata also suspicious for acute to early subacute small vessel ischemia (series 3, image 32). No other evidence for acute or subacute infarct. Gray-white matter differentiation otherwise maintained. No acute intracranial hemorrhage. Few small chronic micro hemorrhages noted within the right thalamus, likely related to underlying hypertension. No encephalomalacia to suggest chronic cortical infarction. No mass lesion, midline shift or mass effect. No hydrocephalus. No extra-axial fluid collection. Normal  pituitary gland. Vascular: Major intracranial vascular flow voids are maintained. Skull and upper cervical spine: Craniocervical junction within normal limits. Upper cervical spine demonstrates mild degenerative spondylolysis without significant stenosis. Bone marrow signal intensity normal. No scalp soft tissue abnormality. Sinuses/Orbits: Globes and orbital soft tissues within normal limits. Mild scattered mucosal thickening throughout the paranasal sinuses. Paranasal sinuses are otherwise clear. No significant mastoid effusion. Inner ear structures normal. Other: None. IMPRESSION: 1. 8 mm acute ischemic nonhemorrhagic right cerebellar infarct. 2. Additional 4 mm focus of diffusion abnormality involving the posterior left corona radiata, also consistent with an acute to early subacute small vessel ischemic infarct. 3. Underlying chronic microvascular ischemic disease with multiple remote lacunar infarcts as above. Electronically Signed   By: Rise Mu M.D.   On: 06/06/2018 18:23   Mr Maxine Glenn Head Wo Contrast  Result Date: 06/07/2018 CLINICAL DATA:  Stroke follow-up EXAM: MRA HEAD WITHOUT CONTRAST TECHNIQUE: Angiographic images of the Circle of Willis were obtained using MRA technique without intravenous contrast. COMPARISON:  Brain MRI 06/06/2018 FINDINGS: POSTERIOR CIRCULATION: --Vertebral arteries: Normal codominant configuration of V4 segments. --Posterior inferior cerebellar arteries (PICA): Right PICA is normal. There is common origin of the left AICA  and PICA, but the PICA itself is poorly visualized. --Anterior inferior cerebellar arteries (AICA): Patent origins from the basilar artery. --Basilar artery: Normal. --Superior cerebellar arteries: Normal. --Posterior cerebral arteries (PCA): Normal. There are bilateral posterior communicating arteries (p-comm) that partially supply the PCAs. ANTERIOR CIRCULATION: --Intracranial internal carotid arteries: Normal. --Anterior cerebral arteries (ACA):  Normal. Both A1 segments are present. Patent anterior communicating artery (a-comm). --Middle cerebral arteries (MCA): Patent without hemodynamically significant stenosis. IMPRESSION: No intracranial arterial occlusion or high-grade stenosis. Electronically Signed   By: Deatra Robinson M.D.   On: 06/07/2018 00:07   Vas US Carotid  Result Date: 06/07/2018 Carotid Arterial Duplex Study Indications:       CVA and Blurry vision. Risk Factors:      Hypertension, current smoker. Other Factors:     Marijuana abuse. CKD stage III. Comparison Study:  Prior study from 09/08/17 is available for comparison Performing Technologist: Sherren Kerns RVS  Examination Guidelines: A complete evaluation includes B-mode imaging, spectral Doppler, color Doppler, and power Doppler as needed of all accessible portions of each vessel. Bilateral testing is considered an integral part of a complete examination. Limited examinations for reoccurring indications may be performed as noted.  Right Carotid Findings: +----------+--------+--------+--------+-----------+------------------+             PSV cm/s EDV cm/s Stenosis Describe    Comments            +----------+--------+--------+--------+-----------+------------------+  CCA Prox   111      19                            intimal thickening  +----------+--------+--------+--------+-----------+------------------+  CCA Distal 75       15                            intimal thickening  +----------+--------+--------+--------+-----------+------------------+  ICA Prox   33       7                 homogeneous                     +----------+--------+--------+--------+-----------+------------------+  ICA Distal 49       17                                                +----------+--------+--------+--------+-----------+------------------+  ECA        84       16                                                +----------+--------+--------+--------+-----------+------------------+  +----------+--------+-------+--------+-------------------+             PSV cm/s EDV cms Describe Arm Pressure (mmHG)  +----------+--------+-------+--------+-------------------+  Subclavian 49                                             +----------+--------+-------+--------+-------------------+ +---------+--------+--+--------+--+  Vertebral PSV cm/s 45 EDV cm/s 13  +---------+--------+--+--------+--+  Left Carotid Findings: +----------+--------+--------+--------+-----------+------------------+  PSV cm/s EDV cm/s Stenosis Describe    Comments            +----------+--------+--------+--------+-----------+------------------+  CCA Prox   127      24                            intimal thickening  +----------+--------+--------+--------+-----------+------------------+  CCA Distal 77       24                            intimal thickening  +----------+--------+--------+--------+-----------+------------------+  ICA Prox   39       13                homogeneous                     +----------+--------+--------+--------+-----------+------------------+  ICA Distal 34       15                                                +----------+--------+--------+--------+-----------+------------------+  ECA        103      22                                                +----------+--------+--------+--------+-----------+------------------+ +----------+--------+--------+--------+-------------------+  Subclavian PSV cm/s EDV cm/s Describe Arm Pressure (mmHG)  +----------+--------+--------+--------+-------------------+             64                                              +----------+--------+--------+--------+-------------------+ +---------+--------+--+--------+--+  Vertebral PSV cm/s 46 EDV cm/s 13  +---------+--------+--+--------+--+  Summary: Right Carotid: The extracranial vessels were near-normal with only minimal wall                thickening or plaque. Left Carotid: The extracranial vessels were near-normal with  only minimal wall               thickening or plaque. Vertebrals:  Bilateral vertebral arteries demonstrate antegrade flow. Subclavians: Normal flow hemodynamics were seen in bilateral subclavian              arteries. *See table(s) above for measurements and observations.     Preliminary       Subjective: - no chest pain, shortness of breath, no abdominal pain, nausea or vomiting.   Discharge Exam: BP (!) 173/111 (BP Location: Left Arm)    Pulse (!) 58    Temp 98.7 F (37.1 C) (Oral)    Resp 18    Ht 6\' 3"  (1.905 m)    Wt 99.8 kg    SpO2 100%    BMI 27.50 kg/m   General: Pt is alert, awake, not in acute distress Cardiovascular: RRR, S1/S2 +, no rubs, no gallops Respiratory: CTA bilaterally, no wheezing, no rhonchi Abdominal: Soft, NT, ND, bowel sounds + Extremities: no edema, no cyanosis    The results of significant diagnostics from this hospitalization (including imaging, microbiology, ancillary and laboratory) are listed below  for reference.     Microbiology: Recent Results (from the past 240 hour(s))  SARS Coronavirus 2 (CEPHEID - Performed in [facility] hospital lab), Hosp Order     Status: None   Collection Time: 06/06/18  4:02 PM  Result Value Ref Range Status   SARS Coronavirus 2 NEGATIVE NEGATIVE Final    Comment: (NOTE) If result is NEGATIVE SARS-CoV-2 target nucleic acids are NOT DETECTED. The SARS-CoV-2 RNA is generally detectable in upper and lower  respiratory specimens during the acute phase of infection. The lowest  concentration of SARS-CoV-2 viral copies this assay can detect is 250  copies / mL. A negative result does not preclude SARS-CoV-2 infection  and should not be used as the sole basis for treatment or other  patient management decisions.  A negative result may occur with  improper specimen collection / handling, submission of specimen other  than nasopharyngeal swab, presence of viral mutation(s) within the  areas targeted by this assay, and  inadequate number of viral copies  (<250 copies / mL). A negative result must be combined with clinical  observations, patient history, and epidemiological information. If result is POSITIVE SARS-CoV-2 target nucleic acids are DETECTED. The SARS-CoV-2 RNA is generally detectable in upper and lower  respiratory specimens dur ing the acute phase of infection.  Positive  results are indicative of active infection with SARS-CoV-2.  Clinical  correlation with patient history and other diagnostic information is  necessary to determine patient infection status.  Positive results do  not rule out bacterial infection or co-infection with other viruses. If result is PRESUMPTIVE POSTIVE SARS-CoV-2 nucleic acids MAY BE PRESENT.   A presumptive positive result was obtained on the submitted specimen  and confirmed on repeat testing.  While 2019 novel coronavirus  (SARS-CoV-2) nucleic acids may be present in the submitted sample  additional confirmatory testing may be necessary for epidemiological  and / or clinical management purposes  to differentiate between  SARS-CoV-2 and other Sarbecovirus currently known to infect humans.  If clinically indicated additional testing with an alternate test  methodology [phone]) is advised. The SARS-CoV-2 RNA is generally  detectable in upper and lower respiratory sp ecimens during the acute  phase of infection. The expected result is Negative. Fact Sheet for Patients:  BoilerBrush.com.cy Fact Sheet for Healthcare Providers: https://pope.com/ This test is not yet approved or cleared by the [place] FDA and has been authorized for detection and/or diagnosis of SARS-CoV-2 by FDA under an Emergency Use Authorization (EUA).  This EUA will remain in effect (meaning this test can be used) for the duration of the COVID-19 declaration under Section 564(b)(1) of the Act, 21 U.S.C. section 360bbb-3(b)(1), unless the  authorization is terminated or revoked sooner. Performed at [facility], [address], [place], [place] [address]      Labs: BNP (last 3 results) No results for input(s): BNP in the last 8760 hours. Basic Metabolic Panel: Recent Labs  Lab 06/06/18 1335 06/06/18 1356  NA 139 138  K 4.2 4.6  CL 103 105  CO2 28  --   GLUCOSE 98 90  BUN 16 25*  CREATININE 2.07* 2.10*  CALCIUM 9.7  --    Liver Function Tests: Recent Labs  Lab 06/06/18 1335  AST 24  ALT 24  ALKPHOS 77  BILITOT 1.3*  PROT 7.5  ALBUMIN 4.4   No results for input(s): LIPASE, AMYLASE in the last 168 hours. No results for input(s): AMMONIA in the last 168 hours.  CBC: Recent Labs  Lab 06/06/18 1335 06/06/18 1356  WBC 3.6*  --   NEUTROABS 1.5*  --   HGB 16.4 17.0  HCT 49.5 50.0  MCV 85.9  --   PLT 181  --    Cardiac Enzymes: Recent Labs  Lab 06/06/18 1444  TROPONINI 0.04*   BNP: Invalid input(s): POCBNP CBG: Recent Labs  Lab 06/06/18 1453  GLUCAP 71   D-Dimer No results for input(s): DDIMER in the last 72 hours. Hgb A1c Recent Labs    06/07/18 0452  HGBA1C 5.1   Lipid Profile Recent Labs    06/07/18 0452  CHOL 188  HDL 38*  LDLCALC 136*  TRIG 70  CHOLHDL 4.9   Thyroid function studies No results for input(s): TSH, T4TOTAL, T3FREE, THYROIDAB in the last 72 hours.  Invalid input(s): FREET3 Anemia work up No results for input(s): VITAMINB12, FOLATE, FERRITIN, TIBC, IRON, RETICCTPCT in the last 72 hours. Urinalysis    Component Value Date/Time   COLORURINE YELLOW 06/07/2016 1723   APPEARANCEUR CLEAR 06/07/2016 1723   LABSPEC 1.010 06/07/2016 1723   PHURINE 7.0 06/07/2016 1723   GLUCOSEU NEGATIVE 06/07/2016 1723   HGBUR NEGATIVE 06/07/2016 1723   HGBUR negative 09/28/2009 0923   BILIRUBINUR NEGATIVE 06/07/2016 1723   KETONESUR NEGATIVE 06/07/2016 1723   PROTEINUR NEGATIVE 06/07/2016 1723   UROBILINOGEN 2.0 09/28/2009 0923   NITRITE NEGATIVE 06/07/2016 1723    LEUKOCYTESUR NEGATIVE 06/07/2016 1723   Sepsis Labs Invalid input(s): PROCALCITONIN,  WBC,  LACTICIDVEN  FURTHER DISCHARGE INSTRUCTIONS:   Get Medicines reviewed and adjusted: Please take all your medications with you for your next visit with your Primary MD   Laboratory/radiological data: Please request your Primary MD to go over all hospital tests and procedure/radiological results at the follow up, please ask your Primary MD to get all Hospital records sent to his/her office.   In some cases, they will be blood work, cultures and biopsy results pending at the time of your discharge. Please request that your primary care M.D. goes through all the records of your hospital data and follows up on these results.   Also Note the following: If you experience worsening of your admission symptoms, develop shortness of breath, life threatening emergency, suicidal or homicidal thoughts you must seek medical attention immediately by calling 911 or calling your MD immediately  if symptoms less severe.   You must read complete instructions/literature along with all the possible adverse reactions/side effects for all the Medicines you take and that have been prescribed to you. Take any new Medicines after you have completely understood and accpet all the possible adverse reactions/side effects.    Do not drive when taking Pain medications or sleeping medications (Benzodaizepines)   Do not take more than prescribed Pain, Sleep and Anxiety Medications. It is not advisable to combine anxiety,sleep and pain medications without talking with your primary care practitioner   Special Instructions: If you have smoked or chewed Tobacco  in the last 2 yrs please stop smoking, stop any regular Alcohol  and or any Recreational drug use.   Wear Seat belts while driving.   Please note: You were cared for by a hospitalist during your hospital stay. Once you are discharged, your primary care physician will handle  any further medical issues. Please note that NO REFILLS for any discharge medications will be authorized once you are discharged, as it is imperative that you return to your primary care physician (or establish a relationship with a primary care  physician if you do not have one) for your post hospital discharge needs so that they can reassess your need for medications and monitor your lab values.  Time coordinating discharge: 35 minutes  SIGNED:  Pamella Pert, MD, PhD 06/07/2018, 4:39 PM

## 2018-06-07 NOTE — Evaluation (Signed)
Speech Language Pathology Evaluation Patient Details Name: Thomas [name] MRN: [id number] DOB: [birth year] Today's Date: 06/07/2018 Time: 1696-7893 SLP Time Calculation (min) (ACUTE ONLY): 20 min  Problem List:  Patient Active Problem List   Diagnosis Date Noted  . Accelerated hypertension 06/06/2018  . 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 Stroke (HCC) 08/29/2017  . CKD (chronic kidney disease), stage II   . Bipolar disorder (HCC)   . Altered mental status 02/24/2016  . Urinary tract infection without hematuria 02/24/2016  . Acute encephalopathy 02/24/2016  . Chest pain 02/24/2016  . History of stroke   . Blurry vision, right eye 11/08/2015  . Acute cerebrovascular accident (CVA) (HCC) 11/08/2015  . Acute kidney injury superimposed on CKD (HCC) 11/07/2015  . Hyperlipidemia 01/26/2014  . TOBACCO ABUSE 04/19/2008  . HYPERTENSION, BENIGN ESSENTIAL 01/22/2005   Past Medical History:  Past Medical History:  Diagnosis Date  . Abnormal EKG   . Bipolar disorder (HCC)   . Cardiomegaly   . CKD (chronic kidney disease), stage II   . Family history of heart disease   . H/O medication noncompliance   . Hyperlipidemia   . Hypertension   . Stroke (HCC) 11/07/2015  . Tobacco abuse    Past Surgical History:  Past Surgical History:  Procedure Laterality Date  . ANKLE FRACTURE SURGERY    . NM MYOCAR PERF WALL MOTION  06/28/2011   protocol Bruce, normal perfusion nin all regions, post stress EF 57%,, exercise cap  . TRANSTHORACIC ECHOCARDIOGRAM  06/28/2011   EF=55%, Proximal septal thickening, borderline LA enlargement, boarderline aortic root dialation   HPI:  Pt is a 53 yo M with primary problem of accelterated HTN. significant PMH of CVA, hyperlipidemia, bipolar disorder, CKD, tobacco abuse & maijuana use. Admitted with visual changes.  MRI showed 8 mm acute ischemic nonhemorrhagic right cerebellar infarct, right corona radiata.    Assessment / Plan /  Recommendation Clinical Impression  Pt presents with normal speech, expressive, and receptive language.  Cognition is marked by deficits in short-term memory specific to word recall, repetition of number strings forward and backward.  Pt acknowledges memory deficits PTA.  Otherwise, insight, reasoning, attention were [facility]. Pt states that his wife handles most of the finances; we discussed basic strategies/precautions to manage memory deficits.  Pt verbalizes understanding.  No f/u SLP is needed.  Our service will sign off.    SLP Assessment  SLP Recommendation/Assessment: Patient does not need any further Speech Lanaguage Pathology Services SLP Visit Diagnosis: Cognitive communication deficit (R41.841)    Follow Up Recommendations  None    Frequency and Duration           SLP Evaluation Cognition  Overall Cognitive Status: History of cognitive impairments - at baseline Arousal/Alertness: Awake/alert Orientation Level: Oriented X4 Attention: Selective Selective Attention: Appears intact Memory: Impaired Memory Impairment: Storage deficit;Retrieval deficit Awareness: Appears intact Problem Solving: Appears intact Safety/Judgment: Appears intact       Comprehension  Auditory Comprehension Overall Auditory Comprehension: Appears within functional limits for tasks assessed    Expression Expression Primary Mode of Expression: Verbal Verbal Expression Overall Verbal Expression: Appears within functional limits for tasks assessed Written Expression Dominant Hand: Right   Oral / Motor  Oral Motor/Sensory Function Overall Oral Motor/Sensory Function: Within functional limits Motor Speech Overall Motor Speech: Appears within functional limits for tasks assessed   GO                    Thomas [name]  06/07/2018, 11:39 AM

## 2018-06-07 NOTE — Evaluation (Signed)
Occupational Therapy Evaluation Patient Details Name: Thomas [name] MRN: [id number] DOB: January 30, 1965 Today's Date: 06/07/2018    History of Present Illness Pt is a 53 yo M with primary problem of accelterated HTN. significant PMH of CVA, hyperlipidemia, bipolar disorder, CKD, tobacco abuse & maijuana use.    Clinical Impression   Patient evaluated by Occupational Therapy with no further acute OT needs identified. All education has been completed and the patient has no further questions. Eval limited due to elevated BP 194/124 sitting.  Pt appears to be at his baseline.  He does continue to c/o blurry vision, and does close Rt eye at times, however, he adamantly denies diplopia, 1 1/2, or shadowing of objects, and eye alignment appears symmetrical with cover/uncover test.  Upon review of therapy notes during previous admissions, pt with similar complaint.   See below for any follow-up Occupational Therapy or equipment needs. OT is signing off. Thank you for this referral.      Follow Up Recommendations  No OT follow up;Supervision - Intermittent    Equipment Recommendations  None recommended by OT    Recommendations for Other Services       Precautions / Restrictions Precautions Precautions: None      Mobility Bed Mobility Overal bed mobility: Independent                Transfers Overall transfer level: Independent               General transfer comment: did not attempt due to elevated BP, but was independent with PT     Balance Overall balance assessment: Independent                                         ADL either performed or assessed with clinical judgement   ADL                                         General ADL Comments: Did not formally assess due to elevated BP, however, pt is able to access feet, is able to ambulate independently, has been ambulating in room.  Anticipate he will be able to perform ADLs  indendently      Vision Baseline Vision/History: Wears glasses Wears Glasses: Reading only Patient Visual Report: Blurring of vision Vision Assessment?: Yes Eye Alignment: Within Functional Limits Ocular Range of Motion: Within Functional Limits Alignment/Gaze Preference: Within Defined Limits Tracking/Visual Pursuits: Able to track stimulus in all quads without difficulty Saccades: Within functional limits Convergence: Impaired - to be further tested in functional context Visual Fields: No apparent deficits Additional Comments: Pt reports blurry vision that is worsens intermittently (per chart review, pt with h/o blurry vision with previous admissions).  He does close Rt eye spontaneously at times, but adamantly denies diplopia or shadowing of objects.  Cover/uncover test reveals normal alignment of eyes, and corneal reflexes are symmetrical.   He is able read menu without difficulty      Perception Perception Perception Tested?: Yes   Praxis Praxis Praxis tested?: Within functional limits    Pertinent Vitals/Pain Pain Assessment: No/denies pain     Hand Dominance Right   Extremity/Trunk Assessment Upper Extremity Assessment Upper Extremity Assessment: Overall WFL for tasks assessed   Lower Extremity Assessment Lower Extremity Assessment: Overall WFL for tasks assessed  Cervical / Trunk Assessment Cervical / Trunk Assessment: Normal   Communication Communication Communication: No difficulties   Cognition Arousal/Alertness: Awake/alert Behavior During Therapy: WFL for tasks assessed/performed Overall Cognitive Status: No family/caregiver present to determine baseline cognitive functioning                                 General Comments: Pt appears to be at baseline - reviewed chart and old therapy notes, which seem consistent with current presentation.  He does appear to have poor health literacy.     General Comments  BP in sitting 194/124 - RN  notified.  Reveiwed BEFAST with pt as well as risk factors for stroke     Exercises     Shoulder Instructions      Home Living Family/patient expects to be discharged to:: Private residence Living Arrangements: Spouse/significant other Available Help at Discharge: Family;Available 24 hours/day Type of Home: House Home Access: (no stairs)     Home Layout: One level     Bathroom Shower/Tub: Chief Strategy OfficerTub/shower unit   Bathroom Toilet: Standard     Home Equipment: None   Additional Comments: lost his cane  Lives With: Spouse;Son    Prior Functioning/Environment Level of Independence: Independent        Comments: Pt does not work.  he is disabled due to "heart issues".  He reports he seldomly drives.  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  He reports spouse manages finances, and daughter sets up his pill box         OT Problem List: Impaired vision/perception      OT Treatment/Interventions:      OT Goals(Current goals can be found in the care plan section) Acute Rehab OT Goals Patient Stated Goal: to go home today to celebrate grandson's first birthday  OT Goal Formulation: All assessment and education complete, DC therapy  OT Frequency:     Barriers to D/C:            Co-evaluation              AM-PAC OT "6 Clicks" Daily Activity     Outcome Measure Help from another person eating meals?: None Help from another person taking care of personal grooming?: None Help from another person toileting, which includes using toliet, bedpan, or urinal?: None Help from another person bathing (including washing, rinsing, drying)?: None Help from another person to put on and taking off regular upper body clothing?: None Help from another person to put on and taking off regular lower body clothing?: None 6 Click Score: 24   End of Session Nurse Communication: Other (comment)(elevated BP )  Activity Tolerance: Treatment limited  secondary to medical complications (Comment) Patient left: in bed;with call bell/phone within reach  OT Visit Diagnosis: Low vision, both eyes (H54.2)                Time: [id number] OT Time Calculation (min): 27 min Charges:  OT General Charges $OT Visit: 1 Visit OT Evaluation $OT Eval Moderate Complexity: 1 Mod OT Treatments $Self Care/Home Management : 8-22 mins  Jeani HawkingWendi Orton Capell, OTR/L Acute Rehabilitation Services Pager [phone] Office [phone]   Jeani HawkingConarpe, Edna Rede M 06/07/2018, 12:36 PM

## 2018-06-07 NOTE — Consult Note (Signed)
Requesting Physician: Dr. Alanda Slim    Chief Complaint: Blurry vision  History obtained from: Patient and Chart    HPI:                                                                                                                                       Thomas [name] is an 53 y.o. male with past medical history of hypertension, CVA, hyperlipidemia, bipolar disorder, CKD, tobacco abuse and occasional marijuana use presents to the ED after blurriness of vision that started around 2 PM yesterday.  Pain is lot when he suddenly felt everything become blurry.  He denies any visual field cuts, sensation of curtain falling down in his eye, no weakness or sensory abnormalities, no trouble with speech.  Patient stated that he also felt little foggy headed.  His blood pressure in the emergency department was significantly high at 247/147 mmHg. He underwent MRI brain which showed acute strokes in the right cerebellum and left corona radiata.  Patient was admitted to medicine service and neurology was consulted. His most recent blood pressure was 160 x 104 mmHg and patient states that his vision symptoms have improved significantly.  Date last known well: 5-14.20 Time last known well: 2 PM tPA Given: No, outside window NIHSS: 0 Baseline MRS 0   Past Medical History:  Diagnosis Date  . Abnormal EKG   . Bipolar disorder (HCC)   . Cardiomegaly   . CKD (chronic kidney disease), stage II   . Family history of heart disease   . H/O medication noncompliance   . Hyperlipidemia   . Hypertension   . Stroke (HCC) 11/07/2015  . Tobacco abuse     Past Surgical History:  Procedure Laterality Date  . ANKLE FRACTURE SURGERY    . NM MYOCAR PERF WALL MOTION  06/28/2011   protocol Bruce, normal perfusion nin all regions, post stress EF 57%,, exercise cap  . TRANSTHORACIC ECHOCARDIOGRAM  06/28/2011   EF=55%, Proximal septal thickening, borderline LA enlargement, boarderline aortic root dialation     Family History  Problem Relation Age of Onset  . Hypertension Mother   . Diabetes Mother   . Stroke Mother   . Hypertension Father   . Diabetes Father   . Stroke Father   . Hypertension Sister   . Stroke Sister   . Diabetes Sister   . Hyperlipidemia Maternal Grandmother   . Diabetes Maternal Grandmother   . Asthma Daughter   . Asthma Son    Social History:  reports that he quit smoking about 2 years ago. His smoking use included cigarettes. He has a 3.75 pack-year smoking history. He has never used smokeless tobacco. He reports previous alcohol use of about 12.0 standard drinks of alcohol per week. He reports current drug use. Drug: Marijuana.  Allergies:  Allergies  Allergen Reactions  . Lisinopril Swelling    Facial swelling    Medications:  I reviewed home medications.  Patient takes aspirin daily.  He is compliant with his blood pressure meds.   ROS:                                                                                                                                     14 systems reviewed and negative except above    Examination:                                                                                                      General: Appears well-developed  Psych: Affect appropriate to situation Eyes: No scleral injection HENT: No OP obstrucion Head: Normocephalic.  Cardiovascular: Normal rate and regular rhythm.  Respiratory: Effort normal and breath sounds normal to anterior ascultation GI: Soft.  No distension. There is no tenderness.  Skin: WDI    Neurological Examination Mental Status: Alert, oriented, thought content appropriate.  Speech fluent without evidence of aphasia. Able to follow 3 step commands without difficulty. Cranial Nerves: II: Visual fields grossly normal,  III,IV, VI: ptosis not present, extra-ocular  motions intact bilaterally, pupils equal, round, reactive to light and accommodation V,VII: smile symmetric, facial light touch sensation normal bilaterally VIII: hearing normal bilaterally IX,X: uvula rises symmetrically XI: bilateral shoulder shrug XII: midline tongue extension Motor: Right : Upper extremity   5/5    Left:     Upper extremity   5/5  Lower extremity   5/5     Lower extremity   5/5 Tone and bulk:normal tone throughout; no atrophy noted Sensory: Pinprick and light touch intact throughout, bilaterallyt Plantars: Right: downgoing   Left: downgoing Cerebellar: normal finger-to-nose, normal rapid alternating movements and normal heel-to-shin test Gait: not tested     Lab Results: Basic Metabolic Panel: Recent Labs  Lab 06/06/18 1335 06/06/18 1356  NA 139 138  K 4.2 4.6  CL 103 105  CO2 28  --   GLUCOSE 98 90  BUN 16 25*  CREATININE 2.07* 2.10*  CALCIUM 9.7  --     CBC: Recent Labs  Lab 06/06/18 1335 06/06/18 1356  WBC 3.6*  --   NEUTROABS 1.5*  --   HGB 16.4 17.0  HCT 49.5 50.0  MCV 85.9  --   PLT 181  --     Coagulation Studies: Recent Labs    06/06/18 1335  LABPROT 14.9  INR 1.2    Imaging: Ct Head Wo Contrast  Result Date: 06/06/2018 CLINICAL DATA:  Dizziness and headaches.  Hypertension.  EXAM: CT HEAD WITHOUT CONTRAST TECHNIQUE: Contiguous axial images were obtained from the base of the skull through the vertex without intravenous contrast. COMPARISON:  Head CT August 28, 2017 and brain MRI August 29, 2017 FINDINGS: Brain: The ventricles are normal in size and configuration. There is no intracranial mass, hemorrhage, extra-axial fluid collection, or midline shift. There is slight small vessel disease in the centra semiovale bilaterally. There are prior lacunar infarcts in the anterior limb of each internal capsule and in the anterior right lentiform nucleus. There is a small prior infarct in the posterior limb of the right external capsule.  There is a small focus of decreased attenuation in the inferior mid right centrum semiovale immediately adjacent to the right lateral ventricle which was not present previously and may represent a small new infarct in the right periventricular white matter. No other findings suggesting potential acute infarct evident. Vascular: There is no hyperdense vessel. There are foci of calcification in each carotid siphon region. Skull: Bony calvarium appears intact. Sinuses/Orbits: There is mucosal thickening involving several ethmoid air cells. Other visualized paranasal sinuses are clear. Orbits appear symmetric bilaterally. Other: Mastoid air cells are clear. IMPRESSION: Focal area of decreased attenuation in the inferior mid right centrum semiovale, immediately adjacent to the right lateral ventricle, best seen on axial slice 19 series 3, coronal slice 36 series 5, and sagittal slice 30 series 6. A small acute infarct in this area is of concern. No other findings suggesting potential acute infarct. Elsewhere, there is periventricular small vessel disease. Small basal ganglia region lacunar type infarcts, stable. No mass or hemorrhage. There are foci of arterial vascular calcification. There is mucosal thickening in several ethmoid air cells. Electronically Signed   By: Bretta BangWilliam  Woodruff III M.D.   On: 06/06/2018 14:46   Mr Brain Wo Contrast (neuro Protocol)  Result Date: 06/06/2018 CLINICAL DATA:  Initial evaluation for acute blurry vision. EXAM: MRI HEAD WITHOUT CONTRAST TECHNIQUE: Multiplanar, multiecho pulse sequences of the brain and surrounding structures were obtained without intravenous contrast. COMPARISON:  Prior CT from earlier the same day as well as previous MRI from 08/29/2017. FINDINGS: Brain: Generalized age-related cerebral atrophy. Underlying mild chronic microvascular ischemic disease present within the periventricular deep white matter both cerebral hemispheres. Multiple chronic remote lacunar  infarcts seen involving the right basal ganglia, right splenium, pons, and bilateral cerebellum. 8 mm acute ischemic nonhemorrhagic infarct present within the right cerebellum (series 3, image 15). No associated mass effect. Additional faint 4 mm focus of diffusion abnormality involving the posterior left corona radiata also suspicious for acute to early subacute small vessel ischemia (series 3, image 32). No other evidence for acute or subacute infarct. Gray-white matter differentiation otherwise maintained. No acute intracranial hemorrhage. Few small chronic micro hemorrhages noted within the right thalamus, likely related to underlying hypertension. No encephalomalacia to suggest chronic cortical infarction. No mass lesion, midline shift or mass effect. No hydrocephalus. No extra-axial fluid collection. Normal pituitary gland. Vascular: Major intracranial vascular flow voids are maintained. Skull and upper cervical spine: Craniocervical junction within normal limits. Upper cervical spine demonstrates mild degenerative spondylolysis without significant stenosis. Bone marrow signal intensity normal. No scalp soft tissue abnormality. Sinuses/Orbits: Globes and orbital soft tissues within normal limits. Mild scattered mucosal thickening throughout the paranasal sinuses. Paranasal sinuses are otherwise clear. No significant mastoid effusion. Inner ear structures normal. Other: None. IMPRESSION: 1. 8 mm acute ischemic nonhemorrhagic right cerebellar infarct. 2. Additional 4 mm focus of diffusion abnormality involving the posterior left corona  radiata, also consistent with an acute to early subacute small vessel ischemic infarct. 3. Underlying chronic microvascular ischemic disease with multiple remote lacunar infarcts as above. Electronically Signed   By: Rise Mu M.D.   On: 06/06/2018 18:23   Mr Maxine Glenn Head Wo Contrast  Result Date: 06/07/2018 CLINICAL DATA:  Stroke follow-up EXAM: MRA HEAD WITHOUT  CONTRAST TECHNIQUE: Angiographic images of the Circle of Willis were obtained using MRA technique without intravenous contrast. COMPARISON:  Brain MRI 06/06/2018 FINDINGS: POSTERIOR CIRCULATION: --Vertebral arteries: Normal codominant configuration of V4 segments. --Posterior inferior cerebellar arteries (PICA): Right PICA is normal. There is common origin of the left AICA and PICA, but the PICA itself is poorly visualized. --Anterior inferior cerebellar arteries (AICA): Patent origins from the basilar artery. --Basilar artery: Normal. --Superior cerebellar arteries: Normal. --Posterior cerebral arteries (PCA): Normal. There are bilateral posterior communicating arteries (p-comm) that partially supply the PCAs. ANTERIOR CIRCULATION: --Intracranial internal carotid arteries: Normal. --Anterior cerebral arteries (ACA): Normal. Both A1 segments are present. Patent anterior communicating artery (a-comm). --Middle cerebral arteries (MCA): Patent without hemodynamically significant stenosis. IMPRESSION: No intracranial arterial occlusion or high-grade stenosis. Electronically Signed   By: Deatra Robinson M.D.   On: 06/07/2018 00:07     ASSESSMENT AND PLAN   53 y.o. male with past medical history of hypertension, CVA, hyperlipidemia, bipolar disorder, CKD, tobacco abuse and occasional marijuana use presents to the ED after blurriness of vision that started around 2 PM yesterday. Brain shows subacute acute lacunar infarcts in the left corona radiata and right cerebellum.  I do wonder if his blurry vision was due to hypertensive emergency rather than acute stroke as his infarcts should not cause visual disturbance, although a cerebellar stroke can cause diplopia.   Hypertensive emergency Subacute to acute lacunar infarcts in the left corona radiata and right cerebellum likely due to uncontrolled hypertension.  Recommend # MRI of the brain without contrast #MRA Head and carotid Dopplers #Transthoracic Echo   #Continue aspirin, will defer to stroke team regarding decision to treat with dual antiplatelet therapy as the infarctions may be  in the setting of severe hypertension. #Start Atorvastatin 40 mg/other high intensity statin # BP goal: permissive HTN upto 180/110 mmHg with gradual reduction to normotension # HBAIC and Lipid profile # Telemetry monitoring # Frequent neuro checks #  stroke swallow screen  Please page stroke NP  Or  PA  Or MD from 8am -4 pm  as this patient from this time will be  followed by the stroke.   You can look them up on [email or web address]  Password [facility]   Jerrell Hart [facility] Pager Number [phone]

## 2018-06-07 NOTE — Evaluation (Signed)
Physical Therapy Evaluation Patient Details Name: Thomas [name] MRN: [id number] DOB: 05/04/1965 Today's Date: 06/07/2018   History of Present Illness  Pt is a 53 yo M with primary problem of accelterated HTN. significant PMH of CVA, hyperlipidemia, bipolar disorder, CKD, tobacco abuse & maijuana use.   Clinical Impression  Thomas [name] was able to independently move from supine to standing and ambulate safely. He feels that he is at his PLOF. PT is not required at this time and we will sign off.     Follow Up Recommendations No PT follow up    Equipment Recommendations  None recommended by PT    Recommendations for Other Services       Precautions / Restrictions Precautions Precautions: None      Mobility  Bed Mobility Overal bed mobility: Independent                Transfers Overall transfer level: Independent                  Ambulation/Gait Ambulation/Gait assistance: Independent Gait Distance (Feet): 200 Feet Assistive device: Rolling walker (2 wheeled) Gait Pattern/deviations: WFL(Within Functional Limits)     General Gait Details: carried RW but not required  Stairs            Wheelchair Mobility    Modified Rankin (Stroke Patients Only)       Balance Overall balance assessment: Independent                                           Pertinent Vitals/Pain Pain Assessment: No/denies pain    Home Living Family/patient expects to be discharged to:: Private residence Living Arrangements: Spouse/significant other Available Help at Discharge: Family;Available 24 hours/day Type of Home: House Home Access: (no stairs)     Home Layout: One level Home Equipment: None Additional Comments: lost his cane    Prior Function Level of Independence: Independent         Comments: Does his yard work for exercise     Hand Dominance        Extremity/Trunk Assessment   Upper Extremity Assessment Upper Extremity  Assessment: Overall WFL for tasks assessed    Lower Extremity Assessment Lower Extremity Assessment: Overall WFL for tasks assessed    Cervical / Trunk Assessment Cervical / Trunk Assessment: Normal  Communication   Communication: No difficulties  Cognition Arousal/Alertness: Awake/alert Behavior During Therapy: WFL for tasks assessed/performed Overall Cognitive Status: Within Functional Limits for tasks assessed                                        General Comments      Exercises     Assessment/Plan    PT Assessment Patent does not need any further PT services  PT Problem List         PT Treatment Interventions      PT Goals (Current goals can be found in the Care Plan section)  Acute Rehab PT Goals Patient Stated Goal: go home PT Goal Formulation: All assessment and education complete, DC therapy    Frequency     Barriers to discharge        Co-evaluation               AM-PAC PT "6 Clicks" Mobility  Outcome Measure Help needed turning from your back to your side while in a flat bed without using bedrails?: None Help needed moving from lying on your back to sitting on the side of a flat bed without using bedrails?: None Help needed moving to and from a bed to a chair (including a wheelchair)?: None Help needed standing up from a chair using your arms (e.g., wheelchair or bedside chair)?: None Help needed to walk in hospital room?: None Help needed climbing 3-5 steps with a railing? : None 6 Click Score: 24    End of Session Equipment Utilized During Treatment: Gait belt Activity Tolerance: Patient tolerated treatment well Patient left: in bed;with call bell/phone within reach   PT Visit Diagnosis: Other abnormalities of gait and mobility (R26.89)    Time: 1020-1035 PT Time Calculation (min) (ACUTE ONLY): 15 min   Charges:   PT Evaluation $PT Eval Moderate Complexity: 1 Mod         Army Fossa PT, DPT Acute  Rehab [phone]

## 2018-08-07 ENCOUNTER — Ambulatory Visit (INDEPENDENT_AMBULATORY_CARE_PROVIDER_SITE_OTHER): Payer: Medicaid Other | Admitting: Adult Health

## 2018-08-07 ENCOUNTER — Other Ambulatory Visit: Payer: Self-pay

## 2018-08-07 ENCOUNTER — Encounter: Payer: Self-pay | Admitting: Adult Health

## 2018-08-07 VITALS — BP 208/122 | HR 66 | Temp 98.2°F | Ht 75.0 in | Wt 203.4 lb

## 2018-08-07 DIAGNOSIS — E7849 Other hyperlipidemia: Secondary | ICD-10-CM | POA: Diagnosis not present

## 2018-08-07 DIAGNOSIS — I639 Cerebral infarction, unspecified: Secondary | ICD-10-CM

## 2018-08-07 DIAGNOSIS — F1911 Other psychoactive substance abuse, in remission: Secondary | ICD-10-CM

## 2018-08-07 DIAGNOSIS — I10 Essential (primary) hypertension: Secondary | ICD-10-CM | POA: Diagnosis not present

## 2018-08-07 DIAGNOSIS — Z720 Tobacco use: Secondary | ICD-10-CM | POA: Diagnosis not present

## 2018-08-07 NOTE — Patient Instructions (Signed)
Continue aspirin 81 mg daily and clopidogrel 75 mg daily  and lipitor 80mg   for secondary stroke prevention  Continue to follow up with PCP regarding cholesterol management   Follow up with cardiology as scheduled for blood pressure  Continue to stay active and maintain a healthy diet  Continue to monitor blood pressure at home  Maintain strict control of hypertension with blood pressure goal below 130/90, diabetes with hemoglobin A1c goal below 6.5% and cholesterol with LDL cholesterol (bad cholesterol) goal below 70 mg/dL. I also advised the patient to eat a healthy diet with plenty of whole grains, cereals, fruits and vegetables, exercise regularly and maintain ideal body weight.  Followup in the future with me in 4 months or call earlier if needed       Thank you for coming to see [place] at [facility]. I hope we have been able to provide you high quality care today.  You may receive a patient satisfaction survey over the next few weeks. We would appreciate your feedback and comments so that we may continue to improve ourselves and the health of our patients.

## 2018-08-07 NOTE — Progress Notes (Signed)
[facility] [address] [place] [id number] [phone]       HOSPITAL FOLLOW UP NOTE  Thomas. Thomas [name] Date of Birth:  05/20/1965 Medical Record Number:  [id number]   Reason for Referral:  hospital stroke follow up    CHIEF COMPLAINT:  Chief Complaint  Patient presents with  . Hospitalization Follow-up    Alone. Treatment room. No new concerns at this time.     HPI: Thomas [name] who is being seen today for initial in office hospital follow-up regarding right cerebellar and posterior left corona radiata infarcts on 06/06/2018. History obtained from patient and chart review. Reviewed all radiology images and labs personally.  Thomas [name] is a 53 y.o. male with history of hypertension, CVA, hyperlipidemia, hx of medication non compliance, bipolar disorder, CKD, tobacco abuse and occasional marijuana use  who presented with blurring of vision, elevated BP and mild fogginess. He did not receive IV tPA due to late presentation (>4.5 hours from time of onset).  Stroke work-up revealed right cerebellar and posterior left corona radiata infarcts secondary to small vessel disease and uncontrolled HTN versus embolic as evidenced on MRI.  Vessel imaging unremarkable.  2D echo unremarkable.  LDL 136 and A1c 5.1.  Increase atorvastatin dose from 40 mg to 80 mg daily.  HTN uncontrolled with elevated BP with highest SBP 247 and DBP 147.  UDS positive for cocaine and THC use with cessation education provided.  Prior history of stroke 2016 and 2019 along with TIA [id number].  Current tobacco use with smoking cessation counseling provided.  Other stroke risk factors include prior EtOH use, family history of stroke and history of medication noncompliance.  No residual deficits and discharged home in stable condition.  He has been doing well since discharge from a neurological standpoint He does endorse occasional speech difficulty since hospitalization such as forgetting  the correct word to say at times or will have to speak slower- typically occurs in the morning - overall improving  He continues to exercise and stays active -has returned back to all prior activities without difficulty He continues to smoke approx 1 cig per week and is aware he needs to quit completely Denies additional substance abuse Blood pressure elevated today at 208/122 asymptomatic stating he just recently took his antihypertensives. he does monitor at home and typically typically 160s-170s - does have follow up with cardiology  on 7/24 who has been managing blood pressure ongoing Continues on aspirin 81 mg and clopidogrel 75 mg daily without bleeding or bruising Continues on atorvastatin 80 mg daily without myalgias No further concerns at this time    ROS:   14 system review of systems performed and negative with exception of speech difficulty  PMH:  Past Medical History:  Diagnosis Date  . Abnormal EKG   . Bipolar disorder (HCC)   . Cardiomegaly   . CKD (chronic kidney disease), stage II   . Family history of heart disease   . H/O medication noncompliance   . Hyperlipidemia   . Hypertension   . Stroke (HCC) 11/07/2015  . Tobacco abuse     PSH:  Past Surgical History:  Procedure Laterality Date  . ANKLE FRACTURE SURGERY    . NM MYOCAR PERF WALL MOTION  06/28/2011   protocol Bruce, normal perfusion nin all regions, post stress EF 57%,, exercise cap 13METS  . TRANSTHORACIC ECHOCARDIOGRAM  06/28/2011   EF=55%, Proximal septal thickening, borderline LA enlargement, boarderline aortic root dialation  Social History:  Social History   Socioeconomic History  . Marital status: Married    Spouse name: Not on file  . Number of children: Not on file  . Years of education: Not on file  . Highest education level: Not on file  Occupational History  . Not on file  Social Needs  . Financial resource strain: Not on file  . Food insecurity    Worry: Not on file     Inability: Not on file  . Transportation needs    Medical: Not on file    Non-medical: Not on file  Tobacco Use  . Smoking status: Former Smoker    Packs/day: 0.25    Years: 15.00    Pack years: 3.75    Types: Cigarettes    Quit date: 11/06/2015    Years since quitting: 2.7  . Smokeless tobacco: Never Used  . Tobacco comment: quit in Juy 2015  Substance and Sexual Activity  . Alcohol use: Not Currently    Alcohol/week: 12.0 standard drinks    Types: 12 Cans of beer per week    Comment: 12 pack on weekends   . Drug use: Yes    Types: Marijuana  . Sexual activity: Not on file  Lifestyle  . Physical activity    Days per week: Not on file    Minutes per session: Not on file  . Stress: Not on file  Relationships  . Social Musicianconnections    Talks on phone: Not on file    Gets together: Not on file    Attends religious service: Not on file    Active member of club or organization: Not on file    Attends meetings of clubs or organizations: Not on file    Relationship status: Not on file  . Intimate partner violence    Fear of current or ex partner: Not on file    Emotionally abused: Not on file    Physically abused: Not on file    Forced sexual activity: Not on file  Other Topics Concern  . Not on file  Social History Narrative  . Not on file    Family History:  Family History  Problem Relation Age of Onset  . Hypertension Mother   . Diabetes Mother   . Stroke Mother   . Hypertension Father   . Diabetes Father   . Stroke Father   . Hypertension Sister   . Stroke Sister   . Diabetes Sister   . Hyperlipidemia Maternal Grandmother   . Diabetes Maternal Grandmother   . Asthma Daughter   . Asthma Son     Medications:   Current Outpatient Medications on File Prior to Visit  Medication Sig Dispense Refill  . aspirin EC 81 MG EC tablet Take 1 tablet (81 mg total) by mouth daily. 30 tablet 1  . atorvastatin (LIPITOR) 80 MG tablet Take 1 tablet (80 mg total) by mouth  daily at 6 PM. 30 tablet 1  . carvedilol (COREG) 6.25 MG tablet Take 1 tablet (6.25 mg total) by mouth 2 (two) times daily. 180 tablet 2  . clopidogrel (PLAVIX) 75 MG tablet Take 1 tablet (75 mg total) by mouth daily. 90 tablet 3  . doxazosin (CARDURA) 4 MG tablet Take 1 tablet (4 mg total) by mouth daily. 90 tablet 5  . hydrALAZINE (APRESOLINE) 50 MG tablet Take 1 tablet (50 mg total) by mouth 3 (three) times daily. 90 tablet 1   No current facility-administered medications on file  prior to visit.     Allergies:   Allergies  Allergen Reactions  . Lisinopril Swelling    Facial swelling     Physical Exam  Vitals:   08/07/18 0842  BP: (!) 208/122  Pulse: 66  Temp: 98.2 F (36.8 C)  TempSrc: Oral  Weight: 203 lb 6.4 oz (92.3 kg)  Height: 6\' 3"  (1.905 m)   Body mass index is 25.42 kg/m. No exam data present  No flowsheet data found.   General: well developed, well nourished,  pleasant middle-aged African-American male, seated, in no evident distress Head: head normocephalic and atraumatic.   Neck: supple with no carotid or supraclavicular bruits Cardiovascular: regular rate and rhythm, no murmurs Musculoskeletal: no deformity Skin:  no rash/petichiae Vascular:  Normal pulses all extremities   Neurologic Exam Mental Status: Awake and fully alert.   Unable to appreciate speech difficulty.  Oriented to place and time. Recent and remote memory intact. Attention span, concentration and fund of knowledge appropriate. Mood and affect appropriate.  Cranial Nerves: Fundoscopic exam reveals sharp disc margins. Pupils equal, briskly reactive to light. Extraocular movements full without nystagmus. Visual fields full to confrontation. Hearing intact. Facial sensation intact. Face, tongue, palate moves normally and symmetrically.  Motor: Normal bulk and tone. Normal strength in all tested extremity muscles. Sensory.: intact to touch , pinprick , position and vibratory sensation.   Coordination: Rapid alternating movements normal in all extremities. Finger-to-nose and heel-to-shin performed accurately bilaterally. Gait and Station: Arises from chair without difficulty. Stance is normal. Gait demonstrates normal stride length and balance Reflexes: 1+ and symmetric. Toes downgoing.     NIHSS  0 Modified Rankin  1    Diagnostic Data (Labs, Imaging, Testing)  Ct Head Wo [name] 06/06/2018 IMPRESSION:  Focal area of decreased attenuation in the inferior mid right centrum semiovale, immediately adjacent to the right lateral ventricle, best seen on axial slice 19 series 3, coronal slice 36 series 5, and sagittal slice 30 series 6. A small acute infarct in this area is of concern.  No other findings suggesting potential acute infarct.  Elsewhere, there is periventricular small vessel disease.  Small basal ganglia region lacunar type infarcts, stable.  No mass or hemorrhage. There are foci of arterial vascular calcification. There is mucosal thickening in several ethmoid air cells.   Thomas [name] (neuro Protocol) 06/06/2018 IMPRESSION:  1. 8 mm acute ischemic nonhemorrhagic right cerebellar infarct.  2. Additional 4 mm focus of diffusion abnormality involving the posterior left corona radiata, also consistent with an acute to early subacute small vessel ischemic infarct.  3. Underlying chronic microvascular ischemic disease with multiple remote lacunar infarcts as above.   Thomas Maxine GlennMra Head Wo [name] 06/07/2018 IMPRESSION:  No intracranial arterial occlusion or high-grade stenosis.   Transthoracic Echocardiogram  1. The left ventricle has normal systolic function, with an ejection fraction of 55-60%. The cavity size was normal. There is severe asymmetric left ventricular hypertrophy and moderate LVH. No evidence of left ventricular regional wall motion  abnormalities. 2. The right ventricle has normal systolic function. The cavity was normal. There is no  increase in right ventricular wall thickness. 3. The mitral valve is degenerative. Moderate calcification of the anterior mitral valve leaflet. 4. The aortic valve is tricuspid. Moderate sclerosis of the aortic valve. Aortic valve regurgitation is mild by color flow Doppler. 5. There is mild dilatation of the aortic root, at the level of the sinuses of Valsalva and of the ascending aorta measuring 40 mm  and of the ascending aorta measuring 19mm.  Bilateral Carotid Dopplers  Right Carotid: The extracranial vessels were near-normal with only minimal wall thickening or plaque.  Left Carotid: The extracranial vessels were near-normal with only minimal wall thickening or plaque.  Vertebrals: Bilateral vertebral arteries demonstrate antegrade flow. Subclavians: Normal flow hemodynamics were seen in bilateral subclavian arteries.   EKG - SB rate 49 BPM. (See cardiology reading for complete details)   Lipid Panel     Component Value Date/Time   CHOL 188 06/07/2018 0452   TRIG 70 06/07/2018 0452   HDL 38 (L) 06/07/2018 0452   CHOLHDL 4.9 06/07/2018 0452   VLDL 14 06/07/2018 0452   LDLCALC 136 (H) 06/07/2018 0452    Drugs of Abuse     Component Value Date/Time   LABOPIA NONE DETECTED 06/07/2018 1554   COCAINSCRNUR POSITIVE (A) 06/07/2018 1554   LABBENZ NONE DETECTED 06/07/2018 1554   AMPHETMU NONE DETECTED 06/07/2018 1554   THCU POSITIVE (A) 06/07/2018 1554   LABBARB NONE DETECTED 06/07/2018 1554       ASSESSMENT: Thomas [name] is a 53 y.o. year old male who presented to [facility] ED on 06/06/2018 with blurred vision, mild fogginess and elevated BP with diagnosis of right cerebellar and posterior left frontal radiata infarcts secondary to small vessel disease and uncontrolled HTN versus embolic pattern. Vascular risk factors include HTN, HLD, prior strokes, tobacco use, substance abuse and history of medication noncompliance.  He has been stable from a stroke standpoint with  subjective occasional speech difficulty but overall recovering well    PLAN:  1. Stroke: Continue aspirin 81 mg daily and clopidogrel 75 mg daily  and atorvastatin 80 mg daily for secondary stroke prevention. Maintain strict control of hypertension with blood pressure goal below 130/90, diabetes with hemoglobin A1c goal below 6.5% and cholesterol with LDL cholesterol (bad cholesterol) goal below 70 mg/dL.  I also advised the patient to eat a healthy diet with plenty of whole grains, cereals, fruits and vegetables, exercise regularly with at least 30 minutes of continuous activity daily and maintain ideal body weight. 2. HTN: Advised to continue current treatment regimen.  Today's BP elevated with home reading SBP 1 60-1 70.  Elevated BP today asymptomatic and advised to contact cardiology for possible sooner appointment and if remains elevated, to proceed to ED 3. HLD: Advised to continue current treatment regimen along with continued follow-up with PCP for future prescribing and monitoring of lipid panel 4. Hx of Substance abuse: Denies ongoing use.  Congratulated him and highly encouraged ongoing avoidance 5. Tobacco use: Highly encouraged complete smoking cessation and educated on importance of this 6. Social Security disability paperwork requesting medical record/office note from today's visit with evidence of recent stroke -will fax office visit note    Follow up in 4 months or call earlier if needed   Greater than 50% of time during this 45 minute visit was spent on counseling, explanation of diagnosis of stroke, reviewing risk factor management of HTN, HLD, history of substance abuse, tobacco use, planning of further management along with potential future management, and discussion with patient and family answering all questions.    Venancio Poisson, AGNP-BC  [facility] [address] [place], [place] [address]  Phone [phone] Fax [phone]  Note: This document was prepared with digital dictation and possible smart phrase technology. Any transcriptional errors that result from this process are unintentional.

## 2018-08-08 NOTE — Progress Notes (Signed)
I agree with the above plan 

## 2018-08-14 ENCOUNTER — Telehealth: Payer: Self-pay | Admitting: Internal Medicine

## 2018-08-14 NOTE — Telephone Encounter (Signed)

## 2018-08-15 ENCOUNTER — Telehealth (INDEPENDENT_AMBULATORY_CARE_PROVIDER_SITE_OTHER): Payer: Medicaid Other | Admitting: Internal Medicine

## 2018-08-15 ENCOUNTER — Telehealth: Payer: Self-pay | Admitting: *Deleted

## 2018-08-15 VITALS — Ht 75.0 in | Wt 206.0 lb

## 2018-08-15 DIAGNOSIS — I10 Essential (primary) hypertension: Secondary | ICD-10-CM

## 2018-08-15 DIAGNOSIS — I129 Hypertensive chronic kidney disease with stage 1 through stage 4 chronic kidney disease, or unspecified chronic kidney disease: Secondary | ICD-10-CM

## 2018-08-15 DIAGNOSIS — N182 Chronic kidney disease, stage 2 (mild): Secondary | ICD-10-CM

## 2018-08-15 DIAGNOSIS — I639 Cerebral infarction, unspecified: Secondary | ICD-10-CM

## 2018-08-15 DIAGNOSIS — E7800 Pure hypercholesterolemia, unspecified: Secondary | ICD-10-CM

## 2018-08-15 MED ORDER — DOXAZOSIN MESYLATE 8 MG PO TABS: 8.0000 mg | ORAL_TABLET | Freq: Every day | ORAL | 3 refills | Status: DC

## 2018-08-15 NOTE — Patient Instructions (Addendum)
  Medication Instructions:   INCREASE DOXAZOSIN 8 MG DAILY - NEW PRESCRIPTION SENT TO PHARMACY  If you need a refill on your cardiac medications before your next appointment, please call your pharmacy.   Lab work: NOT NEEDED     Testing/Procedures: NOT NEEDED  Follow-Up: At [facility], you and your health needs are our priority.  As part of our continuing mission to provide you with exceptional heart care, we have created designated Provider Care Teams.  These Care Teams include your primary Cardiologist (physician) and Advanced Practice Providers (APPs -  Physician Assistants and Nurse Practitioners) who all work together to provide you with the care you need, when you need it. You will need a follow up appointment in 2   Months with [facility] - [facility]- BLOOD PRESSURE SEPT 24 ,2020 AT 1:30 PM    Any Other Special Instructions Will Be Listed Below (If Applicable).

## 2018-08-15 NOTE — Progress Notes (Signed)
Virtual Visit via Telephone Note   This visit type was conducted due to national recommendations for restrictions regarding the COVID-19 Pandemic (e.g. social distancing) in an effort to limit this patient's exposure and mitigate transmission in our community.  Due to his co-morbid illnesses, this patient is at least at moderate risk for complications without adequate follow up.  This format is felt to be most appropriate for this patient at this time.  All issues noted in this document were discussed and addressed.  A limited physical exam was performed with this format.  Please refer to the patient's chart for his consent to telehealth for [facility].   Date:  08/15/2018   ID:  Thomas [name], DOB 12-23-1965, MRN [id number]  Patient Location: Home Provider Location: Office  PCP:  Nolene Ebbs, MD  Cardiologist:  Elouise Munroe, MD  Electrophysiologist:  None   Evaluation Performed:  Follow-Up Visit  Chief Complaint:  HTN, recent strokes  History of Present Illness:    Thomas [name] is a 53 y.o. male with hypertension, CVA without residual deficits, hyperlipidemia, CKD 3, bipolar disorder, tobacco use disorderandmarijuana use disorder. He was hospitalized in May for blurry vision in the right eye, for approximately 1 day prior to admission, which he first noticed while mowing his grass. Of note, we have been treating resistant hypertension over the past several months. BP on ED presentation was 247/147 mmHg. UDS positive for marijuana and cocaine. MRI on admission showed an 8 mm acute ischemic nonhemorrhagic right cerebellar infarct, as well as an additional 4 mm focus of diffusion abnormality involving the posterior left corona radiata, consistent with acute to early subacute small vessel ischemic infarct.  Neurology was consulted and followed patient while hospitalized.  He underwent full work-up including an MRA of the brain without any significant intracranial arterial  occlusion or high-grade stenosis.  Carotid ultrasound without significant plaque buildup.  2D echo with normal EF 55-60%, as well as LVH.  Lipid panel showed an LDL of 136 and his home statin dose has been increased.  Hemoglobin A1c was 5.1.  He was severely hypertensive on admission, it is believed that lacunar strokes are related to that.   He tells me he is feeling well overall, and has not had any more episodes of blurry vision.  He feels his blood pressures tend to run in the 102V systolic, his daughter who is a Psychologist, sport and exercise in our office suggests his blood pressures may be running closer to the 150s while on medications but 200s when he is off of medication.  Currently he has run out of his doxazosin and has not yet refilled.  The patient denies chest pain, chest pressure, dyspnea at rest or with exertion, palpitations, PND, orthopnea, or leg swelling. Denies syncope or presyncope. Denies dizziness or lightheadedness.   The patient does not have symptoms concerning for COVID-19 infection (fever, chills, cough, or new shortness of breath).    Past Medical History:  Diagnosis Date   Abnormal EKG    Bipolar disorder (HCC)    Cardiomegaly    CKD (chronic kidney disease), stage II    Family history of heart disease    H/O medication noncompliance    Hyperlipidemia    Hypertension    Stroke ([place]) 11/07/2015   Tobacco abuse    Past Surgical History:  Procedure Laterality Date   ANKLE FRACTURE SURGERY     NM MYOCAR PERF WALL MOTION  06/28/2011   protocol Bruce, normal perfusion nin  all regions, post stress EF 57%,, exercise cap 13METS   TRANSTHORACIC ECHOCARDIOGRAM  06/28/2011   EF=55%, Proximal septal thickening, borderline LA enlargement, boarderline aortic root dialation     Current Meds  Medication Sig   aspirin EC 81 MG EC tablet Take 1 tablet (81 mg total) by mouth daily.   atorvastatin (LIPITOR) 80 MG tablet Take 1 tablet (80 mg total) by mouth daily at 6  PM.   carvedilol (COREG) 6.25 MG tablet Take 1 tablet (6.25 mg total) by mouth 2 (two) times daily.   clopidogrel (PLAVIX) 75 MG tablet Take 1 tablet (75 mg total) by mouth daily.   doxazosin (CARDURA) 4 MG tablet Take 1 tablet (4 mg total) by mouth daily.   hydrALAZINE (APRESOLINE) 50 MG tablet Take 1 tablet (50 mg total) by mouth 3 (three) times daily.     Allergies:   Lisinopril   Social History   Tobacco Use   Smoking status: Former Smoker    Packs/day: 0.25    Years: 15.00    Pack years: 3.75    Types: Cigarettes    Quit date: 11/06/2015    Years since quitting: 2.7   Smokeless tobacco: Never Used   Tobacco comment: quit in Juy 2015  Substance Use Topics   Alcohol use: Not Currently    Alcohol/week: 12.0 standard drinks    Types: 12 Cans of beer per week    Comment: 12 pack on weekends    Drug use: Yes    Types: Marijuana     Family Hx: The patient's family history includes Asthma in his daughter and son; Diabetes in his father, maternal grandmother, mother, and sister; Hyperlipidemia in his maternal grandmother; Hypertension in his father, mother, and sister; Stroke in his father, mother, and sister.  ROS:   Please see the history of present illness.     All other systems reviewed and are negative.   Prior CV studies:   The following studies were reviewed today:    Labs/Other Tests and Data Reviewed:    EKG:  An ECG dated 06/06/2018 was personally reviewed today and demonstrated:  Sinus brady, LAE, t wave inversions  Recent Labs: 08/30/2017: TSH 2.068 06/06/2018: ALT 24; BUN 25; Creatinine, Ser 2.10; Hemoglobin 17.0; Platelets 181; Potassium 4.6; Sodium 138   Recent Lipid Panel Lab Results  Component Value Date/Time   CHOL 188 06/07/2018 04:52 AM   TRIG 70 06/07/2018 04:52 AM   HDL 38 (L) 06/07/2018 04:52 AM   CHOLHDL 4.9 06/07/2018 04:52 AM   LDLCALC 136 (H) 06/07/2018 04:52 AM    Wt Readings from Last 3 Encounters:  08/15/18 206 lb (93.4 kg)   08/07/18 203 lb 6.4 oz (92.3 kg)  06/06/18 220 lb (99.8 kg)     Objective:    Vital Signs:  Ht 6\' 3"  (1.905 m)    Wt 206 lb (93.4 kg)    BMI 25.75 kg/m    VITAL SIGNS:  reviewed GEN:  no acute distress  ASSESSMENT & PLAN:    1. Resistant hypertension   2. Cerebrovascular accident (CVA), unspecified mechanism (HCC)   3. CKD (chronic kidney disease), stage II   4. Pure hypercholesterolemia    HTN -he still has significantly elevated blood pressures despite multidrug therapy.  There is some issues with medication consistency still.  We are limited in our therapy options due to chronic kidney disease.  While it would be very reasonable to initiate additional therapy, my concern is that inconsistency with medications that  can impact kidney function and electrolytes may prove harmful in the long run.  We certainly could consider spironolactone over time.  She has had facial swelling with lisinopril, that should not be used.  Though there is only mild cross-reactivity with ARB and ACE, we can find alternate options for blood pressure lowering.  The primary issue is medication consistency.  For now I will increase doxazosin to 8 mg daily.  I have been informed by the patient's daughter that he has run out of his doxazosin 4 mg daily for the past several days.  Even while on this medication blood pressure was not optimized and systolic blood pressure was 150 to 170 mmHg.  I believe titration of therapy is warranted given that blood pressures still spiked to the 200s. Cautious use of carvedilol given UDS positive for cocaine - limited alternate options. Pt has been counseled on substance use.   Hyperlipidemia- atorvastatin has been increased in the hospital to 80 mg daily and the patient reports tolerating this well.  Recent CVA- continue aspirin and Plavix per neurology, I have stressed to the patient the importance of taking this daily.  CKD stage III - limited options for hypertension control  with noncompliance and renal dysfunction and severe resistant hypertension with evidence of end organ damage. Upcoming nephrology appt for assistance with therapy recommendations.  COVID-19 Education: The signs and symptoms of COVID-19 were discussed with the patient and how to seek care for testing (follow up with PCP or arrange E-visit).  The importance of social distancing was discussed today.  Time:   Today, I have spent 25 minutes with the patient with telehealth technology discussing the above problems.     Medication Adjustments/Labs and Tests Ordered: Current medicines are reviewed at length with the patient today.  Concerns regarding medicines are outlined above.   Tests Ordered: No orders of the defined types were placed in this encounter.   Medication Changes: Meds ordered this encounter  Medications   doxazosin (CARDURA) 8 MG tablet    Sig: Take 1 tablet (8 mg total) by mouth daily.    Dispense:  90 tablet    Refill:  3    Follow Up:  F/u cvrr htn clinic 2 mo.  Signed, Parke PoissonGayatri A Jayland Null, MD  08/15/2018 9:33 AM    [facility]

## 2018-08-15 NOTE — Telephone Encounter (Signed)
CALLED  NO ANSWER, SPOKE WITH DAUGHTER - INSTRUCTION GIVEN AND AVS SUMMARY    APPT ARRANGED WITH [facility] FOR SEPT 2020.

## 2018-08-18 ENCOUNTER — Ambulatory Visit: Payer: Medicaid Other | Admitting: Internal Medicine

## 2018-10-16 ENCOUNTER — Ambulatory Visit: Payer: Medicaid Other

## 2018-10-16 ENCOUNTER — Telehealth: Payer: Self-pay

## 2018-10-16 NOTE — Progress Notes (Deleted)
     10/16/2018 Thomas [name] 1965/10/20 [id number]   HPI:  Thomas [name] is a 53 y.o. male patient of Dr Margaretann Loveless, with a PMH below who presents today for hypertension clinic evaluation.  Past Medical History: HTN 01/2005  Stroke 10/2015 ASA & Plavix  CKD 10/2015  HLD 01/2014 Atorvastatin TC 188, TG 70, HDL 38, LDL 136 (06/07/2018)  Substance Abuse +UDS 06/07/18 for marijuana and cocaine    Blood Pressure Goal:  130/80  Current Medications: Doxazosin 8mg  Hydralazine 50mg  TID Carvedilol 6.25mg    Family Hx: Asthma in his daughter and son Diabetes in his father, maternal grandmother, mother, and sister; Hyperlipidemia in his maternal grandmother Hypertension in his father, mother, and sister Stroke in his father, mother, and sister.  Social Hx: -tobacco-former smoker-quit 2017 -alcohol 12 cans of beer/week -marijuana and cocaine use  Diet:  Exercise:  Home BP readings:  Intolerances: Angioedema with Lisinopril  Labs:  05/2018     08/2017 SCr         2.1           1.64 K             4.6            3.5 GFR        41             54 CrCl        54             70  Wt Readings from Last 3 Encounters:  08/15/18 206 lb (93.4 kg)  08/07/18 203 lb 6.4 oz (92.3 kg)  06/06/18 220 lb (99.8 kg)   BP Readings from Last 3 Encounters:  08/07/18 (!) 208/122  06/07/18 (!) 173/111  06/06/18 (!) 232/132   Pulse Readings from Last 3 Encounters:  08/07/18 66  06/07/18 (!) 58  06/06/18 (!) 49    Current Outpatient Medications  Medication Sig Dispense Refill  . aspirin EC 81 MG EC tablet Take 1 tablet (81 mg total) by mouth daily. 30 tablet 1  . atorvastatin (LIPITOR) 80 MG tablet Take 1 tablet (80 mg total) by mouth daily at 6 PM. 30 tablet 1  . carvedilol (COREG) 6.25 MG tablet Take 1 tablet (6.25 mg total) by mouth 2 (two) times daily. 180 tablet 2  . clopidogrel (PLAVIX) 75 MG tablet Take 1 tablet (75 mg total) by mouth daily. 90 tablet 3  . doxazosin (CARDURA) 8 MG  tablet Take 1 tablet (8 mg total) by mouth daily. 90 tablet 3  . hydrALAZINE (APRESOLINE) 50 MG tablet Take 1 tablet (50 mg total) by mouth 3 (three) times daily. 90 tablet 1   No current facility-administered medications for this visit.     Allergies  Allergen Reactions  . Lisinopril Swelling    Facial swelling    Past Medical History:  Diagnosis Date  . Abnormal EKG   . Bipolar disorder ([place])   . Cardiomegaly   . CKD (chronic kidney disease), stage II   . Family history of heart disease   . H/O medication noncompliance   . Hyperlipidemia   . Hypertension   . Stroke ([place]) 11/07/2015  . Tobacco abuse     There were no vitals taken for this visit.  No problem-specific Assessment & Plan notes found for this encounter.   Tommy Medal PharmD CPP [facility] [address] [employer] [place], [place] [address] [phone]

## 2018-10-16 NOTE — Telephone Encounter (Signed)
lmomed for missed appt 

## 2018-10-30 ENCOUNTER — Ambulatory Visit: Payer: Medicaid Other

## 2018-11-18 ENCOUNTER — Telehealth: Payer: Self-pay | Admitting: Licensed Clinical Social Worker

## 2018-11-18 NOTE — Telephone Encounter (Signed)
CSW referred to assist patient with obtaining a scale and a BP cuff. CSW contacted patient to inform scale and a cuff will be delivered to home. Message left. CSW available as needed. Raquel Sarna, [place], [place]

## 2018-11-20 ENCOUNTER — Other Ambulatory Visit: Payer: Self-pay

## 2018-11-20 ENCOUNTER — Ambulatory Visit (INDEPENDENT_AMBULATORY_CARE_PROVIDER_SITE_OTHER): Payer: Medicaid Other | Admitting: Pharmacist Clinician (PhC)/ Clinical Pharmacy Specialist

## 2018-11-20 DIAGNOSIS — F17200 Nicotine dependence, unspecified, uncomplicated: Secondary | ICD-10-CM | POA: Diagnosis not present

## 2018-11-20 DIAGNOSIS — E7849 Other hyperlipidemia: Secondary | ICD-10-CM | POA: Diagnosis not present

## 2018-11-20 DIAGNOSIS — I10 Essential (primary) hypertension: Secondary | ICD-10-CM

## 2018-11-20 MED ORDER — CARVEDILOL 6.25 MG PO TABS: 6.2500 mg | ORAL_TABLET | Freq: Two times a day (BID) | ORAL | 2 refills | Status: DC

## 2018-11-20 MED ORDER — DOXAZOSIN MESYLATE 8 MG PO TABS: 8.0000 mg | ORAL_TABLET | Freq: Every day | ORAL | 3 refills | Status: DC

## 2018-11-20 MED ORDER — CLOPIDOGREL BISULFATE 75 MG PO TABS: 75.0000 mg | ORAL_TABLET | Freq: Every day | ORAL | 3 refills | Status: DC

## 2018-11-20 MED ORDER — ATORVASTATIN CALCIUM 80 MG PO TABS: 80.0000 mg | ORAL_TABLET | Freq: Every day | ORAL | 1 refills | Status: DC

## 2018-11-20 MED ORDER — HYDRALAZINE HCL 50 MG PO TABS: 50.0000 mg | ORAL_TABLET | Freq: Three times a day (TID) | ORAL | 1 refills | Status: DC

## 2018-11-20 NOTE — Progress Notes (Signed)
11/21/2018 Thomas [name] 08/09/1965 [id number]   HPI:  Thomas [name] is a 53 y.o. male patient of Thomas [name], with a PMH below who presents today for hypertension clinic evaluation.  See pertinent medical history below.  Patient was seen virtually by Thomas [name] in July for ongoing hypertension issues, with readings over 200/100 not uncommonly.   He has had problems with compliance, in part due to memory issues brought about by multiple CVA/TIA.  Per patient and daughter, most recent TIA was in August, it was his 5th.   He is here today with his daughter, who is a Thomas [name].  She has his phone set with alarms three times a day to remind him to take his medications, but he often misses the first doses of the day (8am) as he is not awake and doesn't hear the alarm.  He has also been out of doxazosin for 3-4 weeks.     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  hyperlipidemia LDL  136  CK3 SCr 2.07  Tobacco disorder 1-2 cigarettes per day  Drug abuse Tested positive for cocaine and THC in May 2020     Blood Pressure Goal:  130/80  Current Medications: carvedilol 6.25 mg bid, doxazosin 8 mg qhs, hydralaine 50 mg tid;  Atorvastatin 80 mg  Family Hx: mother hypertension, now 72; father diabetes, hypertension, died at 97-70; sister died from stroke at 34 (half-sister on dad's side); 4 children - 2 with asthma and epilepsy, 1 with just asthma  Social Hx: no alcohol, coffee daily, some soda Peps[employer] (sips from someone else's can)  Diet: mix of home and takeout; some fried foods, wife adds salt when cooking, none at table  Exercise:  No regular exercise 20 min, mowing lawn, interior painting; likes to play basketball, but has not recently  Home BP readings: daughter has been talking with Thomas [name] about getting home meter and scale  Intolerances:  Lisinopril - angoiedema  Labs:  05/2018:  TC 188, TG 79, HDL 38, LDL 136   Na 138, K 4.6, Glu 90, BUN 25, SCr 2.10 CrCl  (with wt 92.3kg) 53.7  Wt Readings from Last 3 Encounters:  08/15/18 206 lb (93.4 kg)  08/07/18 203 lb 6.4 oz (92.3 kg)  06/06/18 220 lb (99.8 kg)   BP Readings from Last 3 Encounters:  11/20/18 (!) 188/120  08/07/18 (!) 208/122  06/07/18 (!) 173/111   Pulse Readings from Last 3 Encounters:  11/20/18 69  08/07/18 66  06/07/18 (!) 58    Current Outpatient Medications  Medication Sig Dispense Refill  . aspirin EC 81 MG EC tablet Take 1 tablet (81 mg total) by mouth daily. 30 tablet 1  . amLODipine (NORVASC) 5 MG tablet Take 1 tablet (5 mg total) by mouth daily. 30 tablet 3  . atorvastatin (LIPITOR) 80 MG tablet Take 1 tablet (80 mg total) by mouth daily at 6 PM. 90 tablet 1  . carvedilol (COREG) 12.5 MG tablet Take 1 tablet (12.5 mg total) by mouth 2 (two) times daily. 180 tablet 3  . clopidogrel (PLAVIX) 75 MG tablet Take 1 tablet (75 mg total) by mouth daily. 90 tablet 3  . doxazosin (CARDURA) 8 MG tablet Take 1 tablet (8 mg total) by mouth daily. 90 tablet 3  . hydrALAZINE (APRESOLINE) 50 MG tablet Take 1 tablet (50 mg total) by mouth 3 (three) times daily. 90 tablet 1   No current facility-administered medications for this  visit.     Allergies  Allergen Reactions  . Lisinopril Swelling    Facial swelling    Past Medical History:  Diagnosis Date  . Abnormal EKG   . Bipolar disorder (HCC)   . Cardiomegaly   . CKD (chronic kidney disease), stage II   . Family history of heart disease   . H/O medication noncompliance   . Hyperlipidemia   . Hypertension   . Stroke (HCC) 11/07/2015  . Tobacco abuse     Blood pressure (!) 188/120, pulse 69, height 6\' 3"  (1.905 m), SpO2 98 %.  HYPERTENSION, BENIGN ESSENTIAL Patient with systolic/diastolic hypertension, poorly controlled.  He has problems with compliance on a daily basis.  Reviewed information on how to better time doses to improve compliance.  Will start patient on amlodipine 5 mg daily, increase carvedilol to 12.5 mg  bid and refill doxazosin.  Patient will adjust AM doses of medications to 10 am and it was stressed that he not miss doses due to dangerously high blood pressure readings.  He will take mid-day doses at 6 pm (hydralazine and atorvastatin) and then evening medications at 10 pm.  We will see him back in 2 weeks for follow up.  He asked about playing basketball, but I advised he wait until we see him again to get pressure down.    Hyperlipidemia Patient not to LDL goal despite compliance on atorvastatin 80 mg (takes in evenings, thus better compliance).  Due to history of multiple strokes will need to further lower LDL.  Will apply to Medicaid for addition of Repatha.    TOBACCO ABUSE Patient currently smoking just 2-3 cigarettes per day.  Daughter admits to taking them away from him if they see him with one.  Explained the concerns with his elevated blood pressure and the addition of nicotine.  Encouraged him to just give them up completely.     PharmD CPP [facility] [address] [place], [place] [phone]

## 2018-11-20 NOTE — Patient Instructions (Addendum)
Return for a a follow up appointment in 2 weeks  Go to the lab in   Your blood pressure today is 188/120  Check your blood pressure at home daily (if able) and keep record of the readings.  Take your BP meds as follows:  10am:  Amlodipine 5 mg, carvedilol 12.5 mg, hydralazine 50 mg  6 pm : atorvastatin 80 mg, hydralazine 50 mg  10pm:  Carvedilol 12.5 mg, hydralazine 50 mg, doxazosin 8 mg  We will start paperwork to get Repatha approved   Bring all of your meds, your BP cuff and your record of home blood pressures to your next appointment.  Exercise as you're able, try to walk approximately 30 minutes per day.  Keep salt intake to a minimum, especially watch canned and prepared boxed foods.  Eat more fresh fruits and vegetables and fewer canned items.  Avoid eating in fast food restaurants.    HOW TO TAKE YOUR BLOOD PRESSURE: . Rest 5 minutes before taking your blood pressure. .  Don't smoke or drink caffeinated beverages for at least 30 minutes before. . Take your blood pressure before (not after) you eat. . Sit comfortably with your back supported and both feet on the floor (don't cross your legs). . Elevate your arm to heart level on a table or a desk. . Use the proper sized cuff. It should fit smoothly and snugly around your bare upper arm. There should be enough room to slip a fingertip under the cuff. The bottom edge of the cuff should be 1 inch above the crease of the elbow. . Ideally, take 3 measurements at one sitting and record the average.  \bp

## 2018-11-21 MED ORDER — CARVEDILOL 12.5 MG PO TABS: 12.5000 mg | ORAL_TABLET | Freq: Two times a day (BID) | ORAL | 3 refills | Status: DC

## 2018-11-21 MED ORDER — DOXAZOSIN MESYLATE 8 MG PO TABS: 8.0000 mg | ORAL_TABLET | Freq: Every day | ORAL | 3 refills | Status: DC

## 2018-11-21 MED ORDER — AMLODIPINE BESYLATE 5 MG PO TABS: 5.0000 mg | ORAL_TABLET | Freq: Every day | ORAL | 3 refills | Status: DC

## 2018-11-21 NOTE — Assessment & Plan Note (Signed)
Patient not to LDL goal despite compliance on atorvastatin 80 mg (takes in evenings, thus better compliance).  Due to history of multiple strokes will need to further lower LDL.  Will apply to Medicaid for addition of Repatha.

## 2018-11-21 NOTE — Assessment & Plan Note (Signed)
Patient currently smoking just 2-3 cigarettes per day.  Daughter admits to taking them away from him if they see him with one.  Explained the concerns with his elevated blood pressure and the addition of nicotine.  Encouraged him to just give them up completely.

## 2018-11-21 NOTE — Assessment & Plan Note (Signed)
Patient with systolic/diastolic hypertension, poorly controlled.  He has problems with compliance on a daily basis.  Reviewed information on how to better time doses to improve compliance.  Will start patient on amlodipine 5 mg daily, increase carvedilol to 12.5 mg bid and refill doxazosin.  Patient will adjust AM doses of medications to 10 am and it was stressed that he not miss doses due to dangerously high blood pressure readings.  He will take mid-day doses at 6 pm (hydralazine and atorvastatin) and then evening medications at 10 pm.  We will see him back in 2 weeks for follow up.  He asked about playing basketball, but I advised he wait until we see him again to get pressure down.

## 2018-11-26 ENCOUNTER — Other Ambulatory Visit: Payer: Self-pay

## 2018-11-26 ENCOUNTER — Telehealth: Payer: Self-pay

## 2018-11-26 DIAGNOSIS — E7849 Other hyperlipidemia: Secondary | ICD-10-CM

## 2018-11-26 MED ORDER — REPATHA SURECLICK 140 MG/ML ~~LOC~~ SOAJ: 140.0000 mg | SUBCUTANEOUS | 11 refills | Status: DC

## 2018-11-26 NOTE — Telephone Encounter (Signed)
Called and spoke w/pt's daughter about the Repatha approval. rx sent. Instructed to come for labs in 2 months. She voiced understanding

## 2018-11-26 NOTE — Progress Notes (Signed)
Released lipid panel

## 2018-12-02 ENCOUNTER — Ambulatory Visit (INDEPENDENT_AMBULATORY_CARE_PROVIDER_SITE_OTHER): Payer: Medicaid Other | Admitting: Pharmacist Clinician (PhC)/ Clinical Pharmacy Specialist

## 2018-12-02 ENCOUNTER — Other Ambulatory Visit: Payer: Self-pay

## 2018-12-02 VITALS — BP 160/100 | HR 68 | Ht 75.0 in | Wt 208.4 lb

## 2018-12-02 DIAGNOSIS — I10 Essential (primary) hypertension: Secondary | ICD-10-CM

## 2018-12-02 MED ORDER — AMLODIPINE BESYLATE 10 MG PO TABS: 10.0000 mg | ORAL_TABLET | Freq: Every day | ORAL | 3 refills | Status: DC

## 2018-12-02 NOTE — Patient Instructions (Addendum)
Return for a a follow up appointment in 2 weeks  Go to the lab today to check kidney function   Your blood pressure today is 160/100  Check your blood pressure at home twice daily and keep record of the readings.  Take your BP meds as follows:  Increase amlodipine to 10 mg (2 tablets) once daily.  New prescription for 10 mg tablets is at your pharmacy, but take 2 of the 5 mg tablets daily until gone.   Continue with all other medications.  Bring all of your meds, your BP cuff and your record of home blood pressures to your next appointment.  Exercise as you're able, try to walk approximately 30 minutes per day.  Keep salt intake to a minimum, especially watch canned and prepared boxed foods.  Eat more fresh fruits and vegetables and fewer canned items.  Avoid eating in fast food restaurants.    HOW TO TAKE YOUR BLOOD PRESSURE: . Rest 5 minutes before taking your blood pressure. .  Don't smoke or drink caffeinated beverages for at least 30 minutes before. . Take your blood pressure before (not after) you eat. . Sit comfortably with your back supported and both feet on the floor (don't cross your legs). . Elevate your arm to heart level on a table or a desk. . Use the proper sized cuff. It should fit smoothly and snugly around your bare upper arm. There should be enough room to slip a fingertip under the cuff. The bottom edge of the cuff should be 1 inch above the crease of the elbow. . Ideally, take 3 measurements at one sitting and record the average.

## 2018-12-02 NOTE — Progress Notes (Signed)
12/03/2018 Helen Hashimoto Neddo August 16, 1965 [id number]   HPI:  Thomas [name] is a 53 y.o. male patient of Dr Jacques Navy, with a PMH below who presents today for hypertension clinic evaluation.  See pertinent medical history below.  Patient was seen virtually by Dr. Jacques Navy in July for ongoing hypertension issues, with readings over 200/100 not uncommonly.   He has had problems with compliance, in part due to memory issues brought about by multiple CVA/TIA.  Per patient and daughter, most recent TIA was in August, it was his 5th.   We saw him 2 weeks ago in clinic and found him to have a BP of 188/120.  He had not been taking his doxazosin due to problems with refills.  He was on carvedilol 6.25 mg and hydralazine 50 mg tid, but missing multiple doses per week.  Had discussion with patient and daughter, switched the times of day he takes medications to increase compliance.  Daughter sets alarms on his phone to remind him.  Also increased his carvedilol to 12.5 mg bid and added amlodipine 5 mg.    Today he returns for follow up.  He has no complaints of chest pain, shortness of breath or lower extremity edema.  He has become more consistent with taking medications, although the problem with doazosin was due to Medicaid, not lack of refills.  Has still been unable to get this.  He just got the first fill of his Repatha a few days ago, and daughter wanted to check with [place] on how to use the pen, so he will get his first dose later this week.  She has been checking his blood pressure at home, he just recently received a home cuff from the social work team at [employer]. She states mostly in the 150-160's systolic and just under 100 diastolic.   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  hyperlipidemia LDL  136  CK3 SCr 2.07  Tobacco disorder 1-2 cigarettes per day  Drug abuse Tested positive for cocaine and THC in May 2020     Blood Pressure Goal:  130/80  Current Medications: carvedilol 12.5  mg bid, hydralaine 50 mg tid; amlodipine 5 mg qd;  Atorvastatin 80 mg  Family Hx: mother hypertension, now 36; father diabetes, hypertension, died at 42-70; sister died from stroke at 54 (half-sister on dad's side); 4 children - 2 with asthma and epilepsy, 1 with just asthma  Social Hx: no alcohol, coffee daily, some soda Pepsi (sips from someone else's can); one cigarette today  Diet: mix of home and takeout; some fried foods, wife adds salt when cooking, none at table; no soda just juice and water  Exercise:  Now waking 30 minutes twice weekly.  At his last visit, he had asked about playing basketball.  We asked that he refrain until we could get better control of his pressure.  Today he states he is enjoying his walks.  Still mowing lawn and doing yard work.   Home BP readings: just got home cuff.    Intolerances:  Lisinopril - angoiedema  Labs:  05/2018:  TC 188, TG 79, HDL 38, LDL 136   Na 138, K 4.6, Glu 90, BUN 25, SCr 2.10 CrCl (with wt 92.3kg) 53.7  Wt Readings from Last 3 Encounters:  12/02/18 208 lb 6.4 oz (94.5 kg)  08/15/18 206 lb (93.4 kg)  08/07/18 203 lb 6.4 oz (92.3 kg)   BP Readings from Last 3 Encounters:  12/02/18 (!) 160/100  11/20/18 (!) 188/120  08/07/18 (!) 208/122   Pulse Readings from Last 3 Encounters:  12/02/18 68  11/20/18 69  08/07/18 66    Current Outpatient Medications  Medication Sig Dispense Refill  . amLODipine (NORVASC) 10 MG tablet Take 1 tablet (10 mg total) by mouth daily. 90 tablet 3  . aspirin EC 81 MG EC tablet Take 1 tablet (81 mg total) by mouth daily. 30 tablet 1  . atorvastatin (LIPITOR) 80 MG tablet Take 1 tablet (80 mg total) by mouth daily at 6 PM. 90 tablet 1  . carvedilol (COREG) 12.5 MG tablet Take 1 tablet (12.5 mg total) by mouth 2 (two) times daily. 180 tablet 3  . clopidogrel (PLAVIX) 75 MG tablet Take 1 tablet (75 mg total) by mouth daily. 90 tablet 3  . doxazosin (CARDURA) 8 MG tablet Take 1 tablet (8 mg total) by mouth  daily. 90 tablet 3  . Evolocumab (REPATHA SURECLICK) 397 MG/ML SOAJ Inject 140 mg into the skin every 14 (fourteen) days. 2 pen 11  . hydrALAZINE (APRESOLINE) 50 MG tablet Take 1 tablet (50 mg total) by mouth 3 (three) times daily. 90 tablet 1   No current facility-administered medications for this visit.     Allergies  Allergen Reactions  . Lisinopril Swelling    Facial swelling    Past Medical History:  Diagnosis Date  . Abnormal EKG   . Bipolar disorder ([place])   . Cardiomegaly   . CKD (chronic kidney disease), stage II   . Family history of heart disease   . H/O medication noncompliance   . Hyperlipidemia   . Hypertension   . Stroke ([facility]) 11/07/2015  . Tobacco abuse     Blood pressure (!) 160/100, pulse 68, height 6\' 3"  (1.905 m), weight 208 lb 6.4 oz (94.5 kg).  Accelerated hypertension Patient blood pressure dropped 28/20 points with the addition of amlodipine 5 mg, increase in carvedilol and better home medication compliance.  Will increase the amlodipine to 10 mg daily and have him record home BP twice daily for the next 2 weeks.  Repeat BMET today to give options for future medications.  Will see him back again in 2 weeks for follow up.     Tommy Medal PharmD CPP [facility] [address] [place], [place] [address] [phone]

## 2018-12-03 ENCOUNTER — Encounter: Payer: Self-pay | Admitting: Pharmacist Clinician (PhC)/ Clinical Pharmacy Specialist

## 2018-12-03 LAB — BASIC METABOLIC PANEL
BUN/Creatinine Ratio: 9 (ref 9–20)
BUN: 19 mg/dL (ref 6–24)
CO2: 24 mmol/L (ref 20–29)
Calcium: 9.7 mg/dL (ref 8.7–10.2)
Chloride: 104 mmol/L (ref 96–106)
Creatinine, Ser: 2.01 mg/dL — ABNORMAL HIGH (ref 0.76–1.27)
GFR calc Af Amer: 43 mL/min/{1.73_m2} — ABNORMAL LOW (ref >59)
GFR calc non Af Amer: 37 mL/min/{1.73_m2} — ABNORMAL LOW (ref >59)
Glucose: 93 mg/dL (ref 65–99)
Potassium: 3.9 mmol/L (ref 3.5–5.2)
Sodium: 141 mmol/L (ref 134–144)

## 2018-12-03 NOTE — Assessment & Plan Note (Signed)
Patient blood pressure dropped 28/20 points with the addition of amlodipine 5 mg, increase in carvedilol and better home medication compliance.  Will increase the amlodipine to 10 mg daily and have him record home BP twice daily for the next 2 weeks.  Repeat BMET today to give options for future medications.  Will see him back again in 2 weeks for follow up.

## 2018-12-08 ENCOUNTER — Other Ambulatory Visit: Payer: Self-pay

## 2018-12-08 MED ORDER — CARVEDILOL 12.5 MG PO TABS: 12.5000 mg | ORAL_TABLET | Freq: Two times a day (BID) | ORAL | 3 refills | Status: DC

## 2018-12-08 MED ORDER — ATORVASTATIN CALCIUM 80 MG PO TABS: 80.0000 mg | ORAL_TABLET | Freq: Every day | ORAL | 3 refills | Status: DC

## 2018-12-08 MED ORDER — HYDRALAZINE HCL 50 MG PO TABS: 50.0000 mg | ORAL_TABLET | Freq: Three times a day (TID) | ORAL | 3 refills | Status: DC

## 2018-12-10 ENCOUNTER — Other Ambulatory Visit: Payer: Self-pay

## 2018-12-10 ENCOUNTER — Ambulatory Visit: Payer: Medicaid Other | Admitting: Adult Health

## 2018-12-10 ENCOUNTER — Encounter: Payer: Self-pay | Admitting: Adult Health

## 2018-12-10 VITALS — BP 160/90 | HR 57 | Temp 97.8°F | Ht 75.0 in | Wt 211.2 lb

## 2018-12-10 DIAGNOSIS — E785 Hyperlipidemia, unspecified: Secondary | ICD-10-CM

## 2018-12-10 DIAGNOSIS — R29818 Other symptoms and signs involving the nervous system: Secondary | ICD-10-CM

## 2018-12-10 DIAGNOSIS — Z720 Tobacco use: Secondary | ICD-10-CM | POA: Diagnosis not present

## 2018-12-10 DIAGNOSIS — I10 Essential (primary) hypertension: Secondary | ICD-10-CM

## 2018-12-10 DIAGNOSIS — I639 Cerebral infarction, unspecified: Secondary | ICD-10-CM

## 2018-12-10 NOTE — Patient Instructions (Addendum)
Referral placed to our sleep clinic -will be called to schedule initial evaluation for potential sleep apnea  Continue aspirin 81 mg daily and clopidogrel 75 mg daily  and Lipitor and Repatha for secondary stroke prevention  Continue to follow with cardiology regarding blood pressure and cholesterol management  It would be okay for you to take a general multivitamin but please avoid any supplements that could potentially thin your blood as you are currently on 2 blood thinners  Continue to monitor blood pressure at home  Maintain strict control of hypertension with blood pressure goal below 130/90, diabetes with hemoglobin A1c goal below 6.5% and cholesterol with LDL cholesterol (bad cholesterol) goal below 70 mg/dL. I also advised the patient to eat a healthy diet with plenty of whole grains, cereals, fruits and vegetables, exercise regularly and maintain ideal body weight.         Thank you for coming to see [place] at [facility]. I hope we have been able to provide you high quality care today.  You may receive a patient satisfaction survey over the next few weeks. We would appreciate your feedback and comments so that we may continue to improve ourselves and the health of our patients.

## 2018-12-10 NOTE — Progress Notes (Signed)
[facility] [address] [place]. [place] [address] [phone]       OFFICE FOLLOW UP NOTE  [name] Date of Birth:  Jan 17, 1966 Medical Record Number:  [id number]   Reason for Referral: stroke follow up    CHIEF COMPLAINT:  Chief Complaint  Patient presents with  . Follow-up    4 mon f/u. Alone. Rm 9. No new concerns at this time.     HPI: Stroke admission 06/06/2018: [name] is a 53 y.o. male with history of hypertension, multiple strokes, hyperlipidemia, hx of medication non compliance, bipolar disorder, CKD, tobacco abuse and occasional marijuana use  who presented with blurring of vision, elevated BP and mild fogginess. He did not receive IV tPA due to late presentation (>4.5 hours from time of onset).  Stroke work-up revealed right cerebellar and posterior left corona radiata infarcts secondary to small vessel disease and uncontrolled HTN versus embolic as evidenced on MRI.  Vessel imaging unremarkable.  2D echo unremarkable.  LDL 136 and A1c 5.1.  Increase atorvastatin dose from 40 mg to 80 mg daily.  HTN uncontrolled with elevated BP with highest SBP 247 and DBP 147.  UDS positive for cocaine and THC use with cessation education provided.  Prior history of stroke 2016 and 2019 along with TIA 46962.  Current tobacco use with smoking cessation counseling provided.  Other stroke risk factors include prior EtOH use, family history of stroke and history of medication noncompliance.  No residual deficits and discharged home in stable condition.  Initial visit 08/07/2018: He has been doing well since discharge from a neurological standpoint He does endorse occasional speech difficulty since hospitalization such as forgetting the correct word to say at times or will have to speak slower- typically occurs in the morning - overall improving  He continues to exercise and stays active -has returned back to all prior activities without difficulty He  continues to smoke approx 1 cig per week and is aware he needs to quit completely Denies additional substance abuse Blood pressure elevated today at 208/122 asymptomatic stating he just recently took his antihypertensives. he does monitor at home and typically typically 160s-170s - does have follow up with cardiology  on 7/24 who has been managing blood pressure ongoing Continues on aspirin 81 mg and clopidogrel 75 mg daily without bleeding or bruising Continues on atorvastatin 80 mg daily without myalgias No further concerns at this time  Update 12/10/2018: Mr. [name] is a 53 year old male who is being seen today for stroke follow-up.  He has been stable from a stroke standpoint without residual deficits or recurring of symptoms.  Continues on aspirin and Plavix without bleeding or bruising.  He was started on Repatha by cardiology with initial injection last week in addition to atorvastatin.  Blood pressure today elevated at 160/90 but currently following with cardiology for ongoing management with medication adjustments.  His daughter does monitor blood pressures at home but he is unsure of average readings.  Continues smoking 1 cigarette daily and plans on complete cessation near future.  He does endorse exercising daily which has shown benefit with blood pressure improvement.  He does endorse insomnia, mild daytime fatigue and snoring.  He has not previously underwent sleep study.  No concerns at this time.   ROS:   14 system review of systems performed and negative with exception of no concerns  PMH:  Past Medical History:  Diagnosis Date  . Abnormal EKG   . Bipolar disorder (  HCC)   . Cardiomegaly   . CKD (chronic kidney disease), stage II   . Family history of heart disease   . H/O medication noncompliance   . Hyperlipidemia   . Hypertension   . Stroke ([facility]) 11/07/2015  . Tobacco abuse     PSH:  Past Surgical History:  Procedure Laterality Date  . ANKLE FRACTURE SURGERY    .  NM MYOCAR PERF WALL MOTION  06/28/2011   protocol Bruce, normal perfusion nin all regions, post stress EF 57%,, exercise cap 13METS  . TRANSTHORACIC ECHOCARDIOGRAM  06/28/2011   EF=55%, Proximal septal thickening, borderline LA enlargement, boarderline aortic root dialation    Social History:  Social History   Socioeconomic History  . Marital status: Married    Spouse name: Not on file  . Number of children: Not on file  . Years of education: Not on file  . Highest education level: Not on file  Occupational History  . Not on file  Social Needs  . Financial resource strain: Not on file  . Food insecurity    Worry: Not on file    Inability: Not on file  . Transportation needs    Medical: Not on file    Non-medical: Not on file  Tobacco Use  . Smoking status: Former Smoker    Packs/day: 0.25    Years: 15.00    Pack years: 3.75    Types: Cigarettes    Quit date: 11/06/2015    Years since quitting: 3.0  . Smokeless tobacco: Never Used  . Tobacco comment: quit in Juy 2015  Substance and Sexual Activity  . Alcohol use: Not Currently    Alcohol/week: 12.0 standard drinks    Types: 12 Cans of beer per week    Comment: 12 pack on weekends   . Drug use: Yes    Types: Marijuana  . Sexual activity: Not on file  Lifestyle  . Physical activity    Days per week: Not on file    Minutes per session: Not on file  . Stress: Not on file  Relationships  . Social Musicianconnections    Talks on phone: Not on file    Gets together: Not on file    Attends religious service: Not on file    Active member of club or organization: Not on file    Attends meetings of clubs or organizations: Not on file    Relationship status: Not on file  . Intimate partner violence    Fear of current or ex partner: Not on file    Emotionally abused: Not on file    Physically abused: Not on file    Forced sexual activity: Not on file  Other Topics Concern  . Not on file  Social History Narrative  . Not on  file    Family History:  Family History  Problem Relation Age of Onset  . Hypertension Mother   . Diabetes Mother   . Stroke Mother   . Hypertension Father   . Diabetes Father   . Stroke Father   . Hypertension Sister   . Stroke Sister   . Diabetes Sister   . Hyperlipidemia Maternal Grandmother   . Diabetes Maternal Grandmother   . Asthma Daughter   . Asthma Son     Medications:   Current Outpatient Medications on File Prior to Visit  Medication Sig Dispense Refill  . amLODipine (NORVASC) 10 MG tablet Take 1 tablet (10 mg total) by mouth daily. 90 tablet 3  .  aspirin EC 81 MG EC tablet Take 1 tablet (81 mg total) by mouth daily. 30 tablet 1  . atorvastatin (LIPITOR) 80 MG tablet Take 1 tablet (80 mg total) by mouth daily at 6 PM. 90 tablet 3  . carvedilol (COREG) 12.5 MG tablet Take 1 tablet (12.5 mg total) by mouth 2 (two) times daily. 180 tablet 3  . clopidogrel (PLAVIX) 75 MG tablet Take 1 tablet (75 mg total) by mouth daily. 90 tablet 3  . doxazosin (CARDURA) 8 MG tablet Take 1 tablet (8 mg total) by mouth daily. 90 tablet 3  . Evolocumab (REPATHA SURECLICK) 932 MG/ML SOAJ Inject 140 mg into the skin every 14 (fourteen) days. 2 pen 11  . hydrALAZINE (APRESOLINE) 50 MG tablet Take 1 tablet (50 mg total) by mouth 3 (three) times daily. 270 tablet 3   No current facility-administered medications on file prior to visit.     Allergies:   Allergies  Allergen Reactions  . Lisinopril Swelling    Facial swelling     Physical Exam  Vitals:   12/10/18 0831  BP: (!) 160/90  Pulse: (!) 57  Temp: 97.8 F (36.6 C)  TempSrc: Oral  Weight: 211 lb 3.2 oz (95.8 kg)  Height: 6\' 3"  (1.905 m)   Body mass index is 26.4 kg/m. No exam data present  General: well developed, well nourished,  pleasant middle-aged African-American male, seated, in no evident distress Head: head normocephalic and atraumatic.   Neck: supple with no carotid or supraclavicular bruits Cardiovascular:  regular rate and rhythm, no murmurs Musculoskeletal: no deformity Skin:  no rash/petichiae Vascular:  Normal pulses all extremities   Neurologic Exam Mental Status: Awake and fully alert.   Normal speech and language.  Oriented to place and time. Recent and remote memory intact. Attention span, concentration and fund of knowledge appropriate. Mood and affect appropriate.  Cranial Nerves: Pupils equal, briskly reactive to light. Extraocular movements full without nystagmus. Visual fields full to confrontation. Hearing intact. Facial sensation intact. Face, tongue, palate moves normally and symmetrically.  Motor: Normal bulk and tone. Normal strength in all tested extremity muscles. Sensory.: intact to touch , pinprick , position and vibratory sensation.  Coordination: Rapid alternating movements normal in all extremities. Finger-to-nose and heel-to-shin performed accurately bilaterally. Gait and Station: Arises from chair without difficulty. Stance is normal. Gait demonstrates normal stride length and balance Reflexes: 1+ and symmetric. Toes downgoing.         Diagnostic Data (Labs, Imaging, Testing)  Ct Head Wo Contrast 06/06/2018 IMPRESSION:  Focal area of decreased attenuation in the inferior mid right centrum semiovale, immediately adjacent to the right lateral ventricle, best seen on axial slice 19 series 3, coronal slice 36 series 5, and sagittal slice 30 series 6. A small acute infarct in this area is of concern.  No other findings suggesting potential acute infarct.  Elsewhere, there is periventricular small vessel disease.  Small basal ganglia region lacunar type infarcts, stable.  No mass or hemorrhage. There are foci of arterial vascular calcification. There is mucosal thickening in several ethmoid air cells.   Mr Brain Wo Contrast (neuro Protocol) 06/06/2018 IMPRESSION:  1. 8 mm acute ischemic nonhemorrhagic right cerebellar infarct.  2. Additional 4 mm focus of diffusion  abnormality involving the posterior left corona radiata, also consistent with an acute to early subacute small vessel ischemic infarct.  3. Underlying chronic microvascular ischemic disease with multiple remote lacunar infarcts as above.   Mr Jodene Nam Head Wo Contrast 06/07/2018 IMPRESSION:  No intracranial arterial occlusion or high-grade stenosis.   Transthoracic Echocardiogram  1. The left ventricle has normal systolic function, with an ejection fraction of 55-60%. The cavity size was normal. There is severe asymmetric left ventricular hypertrophy and moderate LVH. No evidence of left ventricular regional wall motion  abnormalities. 2. The right ventricle has normal systolic function. The cavity was normal. There is no increase in right ventricular wall thickness. 3. The mitral valve is degenerative. Moderate calcification of the anterior mitral valve leaflet. 4. The aortic valve is tricuspid. Moderate sclerosis of the aortic valve. Aortic valve regurgitation is mild by color flow Doppler. 5. There is mild dilatation of the aortic root, at the level of the sinuses of Valsalva and of the ascending aorta measuring 40 mm and of the ascending aorta measuring 41mm.  Bilateral Carotid Dopplers  Right Carotid: The extracranial vessels were near-normal with only minimal wall thickening or plaque.  Left Carotid: The extracranial vessels were near-normal with only minimal wall thickening or plaque.  Vertebrals: Bilateral vertebral arteries demonstrate antegrade flow. Subclavians: Normal flow hemodynamics were seen in bilateral subclavian arteries.   EKG - SB rate 49 BPM. (See cardiology reading for complete details)   Lipid Panel     Component Value Date/Time   CHOL 188 06/07/2018 0452   TRIG 70 06/07/2018 0452   HDL 38 (L) 06/07/2018 0452   CHOLHDL 4.9 06/07/2018 0452   VLDL 14 06/07/2018 0452   LDLCALC 136 (H) 06/07/2018 0452    Drugs of Abuse     Component Value  Date/Time   LABOPIA NONE DETECTED 06/07/2018 1554   COCAINSCRNUR POSITIVE (A) 06/07/2018 1554   LABBENZ NONE DETECTED 06/07/2018 1554   AMPHETMU NONE DETECTED 06/07/2018 1554   THCU POSITIVE (A) 06/07/2018 1554   LABBARB NONE DETECTED 06/07/2018 1554       ASSESSMENT: Thomas [name] is a 53 y.o. year old male who presented to [facility] ED on 06/06/2018 with blurred vision, mild fogginess and elevated BP with diagnosis of right cerebellar and posterior left frontal radiata infarcts secondary to small vessel disease and uncontrolled HTN versus embolic pattern. Vascular risk factors include HTN, HLD, prior strokes, tobacco use, substance abuse and history of medication noncompliance.  He has been doing well from a stroke standpoint without residual deficits.  He continues to have difficulty with blood pressure management    PLAN:  1. Stroke: Continue aspirin 81 mg daily and clopidogrel 75 mg daily  and Repatha and atorvastatin 80 mg daily for secondary stroke prevention. Maintain strict control of hypertension with blood pressure goal below 130/90, diabetes with hemoglobin A1c goal below 6.5% and cholesterol with LDL cholesterol (bad cholesterol) goal below 70 mg/dL.  I also advised the patient to eat a healthy diet with plenty of whole grains, cereals, fruits and vegetables, exercise regularly with at least 30 minutes of continuous activity daily and maintain ideal body weight. 2. HTN: Advised to continue current treatment regimen.  Blood pressure continues to be elevated and advised to continue to follow with cardiology for ongoing management 3. HLD: Advised to continue current treatment regimen along with continued follow-up with PCP for future prescribing and monitoring of lipid panel 4. Tobacco use: Congratulated on decreasing daily amount and discussion regarding importance of complete cessation 5. Suspect sleep apnea: Referral placed to [facility] sleep clinic for evaluation of potential sleep apnea.   Stop bang score 5 showing high risk of OSA.  Discussion regarding untreated sleep apnea can put him at higher risk  of difficulty managing blood pressures, increased risk for cardiovascular disease and increased risk for additional strokes.   Overall stable from stroke standpoint and recommend follow-up as needed   Greater than 50% of time during this 25 minute visit was spent on counseling, explanation of diagnosis of stroke, reviewing risk factor management of HTN, HLD, history of substance abuse, tobacco use, discussion regarding potential sleep apnea with ongoing difficulty controlling blood pressures and extensive cardiovascular history, planning of further management along with potential future management, and discussion with patient answering all questions to satisfaction    Ihor Austin, AGNP-BC  [facility] [address] [place], [place] [address]  Phone [phone] Fax [phone] Note: This document was prepared with digital dictation and possible smart phrase technology. Any transcriptional errors that result from this process are unintentional.

## 2018-12-11 ENCOUNTER — Telehealth: Payer: Self-pay | Admitting: *Deleted

## 2018-12-11 NOTE — Telephone Encounter (Signed)
Dr Oval Linsey agreed to take patient in HTN clinic study. Will reach out to the patient once this has been made available

## 2018-12-12 NOTE — Progress Notes (Signed)
I agree with the above plan 

## 2018-12-16 ENCOUNTER — Encounter: Payer: Self-pay | Admitting: Pharmacist Clinician (PhC)/ Clinical Pharmacy Specialist

## 2018-12-16 ENCOUNTER — Ambulatory Visit (INDEPENDENT_AMBULATORY_CARE_PROVIDER_SITE_OTHER): Payer: Medicaid Other | Admitting: Pharmacist Clinician (PhC)/ Clinical Pharmacy Specialist

## 2018-12-16 ENCOUNTER — Other Ambulatory Visit: Payer: Self-pay

## 2018-12-16 DIAGNOSIS — I10 Essential (primary) hypertension: Secondary | ICD-10-CM | POA: Diagnosis not present

## 2018-12-16 MED ORDER — ATORVASTATIN CALCIUM 80 MG PO TABS: 80.0000 mg | ORAL_TABLET | Freq: Every day | ORAL | 3 refills | Status: DC

## 2018-12-16 MED ORDER — CHLORTHALIDONE 25 MG PO TABS: 12.5000 mg | ORAL_TABLET | Freq: Every day | ORAL | 3 refills | Status: DC

## 2018-12-16 NOTE — Assessment & Plan Note (Signed)
Patient making slow improvements in his blood pressure.  Today was best yet at 154/100, down from seeing regular readings >200/110.  With his CrCl at 58, I would like to try giving him a low dose of chlorthalidone to see if we can get a further drop in both systolic and diastolic readings without adversely affecting his kidneys.  He is scheduled to see Dr. Margaretann Loveless in 2 weeks, and we will have him repeat his metabolic panel at that time.  We will be happy to continue following him after her visit as needed.

## 2018-12-16 NOTE — Progress Notes (Signed)
12/16/2018 [name] [birth year] [id number]   HPI:  Thomas [name] is a 53 y.o. male patient of Dr Jacques Navy, with a PMH below who presents today for hypertension clinic evaluation.  See pertinent medical history below.  Patient was seen virtually by Dr. Jacques Navy in July for ongoing hypertension issues, with readings over 200/100 not uncommonly.   He has had problems with compliance, in part due to memory issues brought about by multiple CVA/TIA.  Per patient and daughter, most recent TIA was in August, it was his 5th.   He has been seen in the hypertension clinic several times in the past month. At his first visit, blood pressure was elevated at 188/120 and he admitted to non-compliance with medications.  We were able to get him on a better time schedule for taking his meds, as well as increasing doses and he returned 2 weeks later at 160/100.  His daughter has been helping him with medications, staying on track and being compliant.  Over this time we have increased his carvedilol, added amlodipine and titrated it to 10 mg.      Today he returns for follow up.  He continues to feel well and is noting some decrease in home BP readings.  He forgot his log sheet today, but his daughter was able to join [place] (she works in [employer]) and noted that his readings are mostly > 170/100.  She notes there were no diastolic readings < 100.  He has not had any lower extremity edema, chest pain or shortness of breath.  We repeated a metabolic panel at his last visit and his SCr dropped to 2.01, giving him a CrCl of 58.3.    He took his first dose of Repatha two weeks ago and is due another dose tomorrow.  He reports no side effects from the first dose.    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  hyperlipidemia LDL  136  CK3 SCr 2.07  Tobacco disorder 1-2 cigarettes per day  Drug abuse Tested positive for cocaine and THC in May 2020     Blood Pressure Goal:  130/80  Current Medications:  carvedilol 12.5 mg bid, hydralaine 50 mg tid; amlodipine 5 mg qd;  Atorvastatin 80 mg  Family Hx: mother hypertension, now 25; father diabetes, hypertension, died at 45-70; sister died from stroke at 34 (half-sister on dad's side); 4 children - 2 with asthma and epilepsy, 1 with just asthma  Social Hx: no alcohol, coffee daily, some soda Pepsi (sips from someone else's can); one cigarette today  Diet: notes eating more home cooked foods in past few weeks   Exercise:  Increased waking 30 minutes three times weekly.  Has not played any basketball recently  Home BP readings: just got home cuff.  Per daughter 170-190/100+   Intolerances:  Lisinopril - angoiedema  Labs:  05/2018:  TC 188, TG 79, HDL 38, LDL 136   Na 138, K 4.6, Glu 90, BUN 25, SCr 2.10 CrCl (with wt 92.3kg) 53.7  Wt Readings from Last 3 Encounters:  12/16/18 207 lb 12.8 oz (94.3 kg)  12/10/18 211 lb 3.2 oz (95.8 kg)  12/02/18 208 lb 6.4 oz (94.5 kg)   BP Readings from Last 3 Encounters:  12/16/18 (!) 154/100  12/10/18 (!) 160/90  12/02/18 (!) 160/100   Pulse Readings from Last 3 Encounters:  12/16/18 60  12/10/18 (!) 57  12/02/18 68    Current Outpatient Medications  Medication Sig  Dispense Refill  . amLODipine (NORVASC) 10 MG tablet Take 1 tablet (10 mg total) by mouth daily. 90 tablet 3  . aspirin EC 81 MG EC tablet Take 1 tablet (81 mg total) by mouth daily. 30 tablet 1  . atorvastatin (LIPITOR) 80 MG tablet Take 1 tablet (80 mg total) by mouth daily at 6 PM. 90 tablet 3  . carvedilol (COREG) 12.5 MG tablet Take 1 tablet (12.5 mg total) by mouth 2 (two) times daily. 180 tablet 3  . chlorthalidone (HYGROTON) 25 MG tablet Take 0.5 tablets (12.5 mg total) by mouth daily. 15 tablet 3  . clopidogrel (PLAVIX) 75 MG tablet Take 1 tablet (75 mg total) by mouth daily. 90 tablet 3  . Evolocumab (REPATHA SURECLICK) 440 MG/ML SOAJ Inject 140 mg into the skin every 14 (fourteen) days. 2 pen 11  . hydrALAZINE (APRESOLINE) 50  MG tablet Take 1 tablet (50 mg total) by mouth 3 (three) times daily. 270 tablet 3   No current facility-administered medications for this visit.     Allergies  Allergen Reactions  . Lisinopril Swelling    Facial swelling    Past Medical History:  Diagnosis Date  . Abnormal EKG   . Bipolar disorder ([place])   . Cardiomegaly   . CKD (chronic kidney disease), stage II   . Family history of heart disease   . H/O medication noncompliance   . Hyperlipidemia   . Hypertension   . Stroke ([place]) 11/07/2015  . Tobacco abuse     Blood pressure (!) 154/100, pulse 60, height 6\' 3"  (1.905 m), weight 207 lb 12.8 oz (94.3 kg).  HYPERTENSION, BENIGN ESSENTIAL Patient making slow improvements in his blood pressure.  Today was best yet at 154/100, down from seeing regular readings >200/110.  With his CrCl at 58, I would like to try giving him a low dose of chlorthalidone to see if we can get a further drop in both systolic and diastolic readings without adversely affecting his kidneys.  He is scheduled to see Dr. Margaretann Loveless in 2 weeks, and we will have him repeat his metabolic panel at that time.  We will be happy to continue following him after her visit as needed.     Tommy Medal PharmD CPP [facility] [address] [place], [place] [address] [phone]

## 2018-12-16 NOTE — Patient Instructions (Signed)
Return for a a follow up appointment with Dr. Margaretann Loveless on December 10   Go to the lab on that day (Dec 11) to check kidney function  Your blood pressure today is 154/100  Check your blood pressure at home daily and keep record of the readings.  Take your BP meds as follows:  Start chlorthalidone 12.5 mg (1/2 tablet) once daily in the mornings  Continue with all other medications  Bring all of your meds, your BP cuff and your record of home blood pressures to your next appointment.  Exercise as you're able, try to walk approximately 30 minutes per day.  Keep salt intake to a minimum, especially watch canned and prepared boxed foods.  Eat more fresh fruits and vegetables and fewer canned items.  Avoid eating in fast food restaurants.    HOW TO TAKE YOUR BLOOD PRESSURE: . Rest 5 minutes before taking your blood pressure. .  Don't smoke or drink caffeinated beverages for at least 30 minutes before. . Take your blood pressure before (not after) you eat. . Sit comfortably with your back supported and both feet on the floor (don't cross your legs). . Elevate your arm to heart level on a table or a desk. . Use the proper sized cuff. It should fit smoothly and snugly around your bare upper arm. There should be enough room to slip a fingertip under the cuff. The bottom edge of the cuff should be 1 inch above the crease of the elbow. . Ideally, take 3 measurements at one sitting and record the average.

## 2018-12-23 NOTE — Telephone Encounter (Signed)
Patient is currently being seen in HTN Pharm D clinic and making progress. Per Dr Oval Linsey she is happy to see patient however will continue with HTN clinic for now. Did send message to get patient set up for PREP program

## 2018-12-31 ENCOUNTER — Encounter: Payer: Self-pay | Admitting: Neurology

## 2018-12-31 ENCOUNTER — Other Ambulatory Visit: Payer: Self-pay

## 2018-12-31 ENCOUNTER — Ambulatory Visit: Payer: Medicaid Other | Admitting: Neurology

## 2018-12-31 VITALS — BP 141/89 | HR 55 | Ht 75.0 in | Wt 214.0 lb

## 2018-12-31 DIAGNOSIS — R351 Nocturia: Secondary | ICD-10-CM

## 2018-12-31 DIAGNOSIS — R0683 Snoring: Secondary | ICD-10-CM

## 2018-12-31 DIAGNOSIS — Z8673 Personal history of transient ischemic attack (TIA), and cerebral infarction without residual deficits: Secondary | ICD-10-CM

## 2018-12-31 DIAGNOSIS — F17200 Nicotine dependence, unspecified, uncomplicated: Secondary | ICD-10-CM | POA: Diagnosis not present

## 2018-12-31 DIAGNOSIS — G4719 Other hypersomnia: Secondary | ICD-10-CM

## 2018-12-31 NOTE — Patient Instructions (Signed)

## 2018-12-31 NOTE — Progress Notes (Signed)
Subjective:    Patient ID: Thomas [name] is a 53 y.o. male.  HPI     Huston Foley, MD, PhD [facility] [address], [address] [address] [place], [place] [address]  Dear Shanda Bumps,   I saw your patient, Thomas [name], upon your kind request in my sleep clinic today for initial consultation of his sleep disorder, in particular, concern for underlying obstructive sleep apnea.  The patient is unaccompanied today.  As you know, Thomas [name] is a 53 year old right-handed gentleman with an underlying medical history of stroke, substance abuse, smoking, hypertension, hyperlipidemia, chronic kidney disease, mood disorder, cardiomegaly and borderline overweight state, who reports snoring and excessive daytime somnolence.  I reviewed your office note from 12/10/2018.  His Epworth sleepiness score is 10 out of 24, fatigue severity score is 43 out of 63. He has woken up with a sense of gasping for air.  He denies any alcohol use currently.  He does not keep a set schedule for his sleep.  He may sleep into the early afternoon hours.  Lives with his wife, he does not currently work.  He has 4 children, his son currently lives with them.  He has reduced his smoking, reports smoking 1 cigarette/day, he currently does not use any alcohol and drinks caffeine occasionally.  He is not aware of any family history of OSA.  He has nocturia about 2-3 times per average night and denies recurrent morning headaches.  His Past Medical History Is Significant For: Past Medical History:  Diagnosis Date  . Abnormal EKG   . Bipolar disorder ([facility])   . Cardiomegaly   . CKD (chronic kidney disease), stage II   . Family history of heart disease   . H/O medication noncompliance   . Hyperlipidemia   . Hypertension   . Stroke ([facility]) 11/07/2015  . Tobacco abuse     His Past Surgical History Is Significant For: Past Surgical History:  Procedure Laterality Date  . ANKLE FRACTURE SURGERY    . NM  MYOCAR PERF WALL MOTION  06/28/2011   protocol [place], normal perfusion nin all regions, post stress EF 57%,, exercise cap  . TRANSTHORACIC ECHOCARDIOGRAM  06/28/2011   EF=55%, Proximal septal thickening, borderline LA enlargement, boarderline aortic root dialation    His Family History Is Significant For: Family History  Problem Relation Age of Onset  . Hypertension Mother   . Diabetes Mother   . Stroke Mother   . Hypertension Father   . Diabetes Father   . Stroke Father   . Hypertension Sister   . Stroke Sister   . Diabetes Sister   . Hyperlipidemia Maternal Grandmother   . Diabetes Maternal Grandmother   . Asthma Daughter   . Asthma Son     His Social History Is Significant For: Social History   Socioeconomic History  . Marital status: Married    Spouse name: Not on file  . Number of children: Not on file  . Years of education: Not on file  . Highest education level: Not on file  Occupational History  . Not on file  Social Needs  . Financial resource strain: Not on file  . Food insecurity    Worry: Not on file    Inability: Not on file  . Transportation needs    Medical: Not on file    Non-medical: Not on file  Tobacco Use  . Smoking status: Former Smoker    Packs/day: 0.25    Years: 15.00  Pack years: 3.75    Types: Cigarettes    Quit date: 11/06/2015    Years since quitting: 3.1  . Smokeless tobacco: Never Used  . Tobacco comment: quit in Juy 2015  Substance and Sexual Activity  . Alcohol use: Not Currently    Alcohol/week: 12.0 standard drinks    Types: 12 Cans of beer per week    Comment: 12 pack on weekends   . Drug use: Yes    Types: Marijuana  . Sexual activity: Not on file  Lifestyle  . Physical activity    Days per week: Not on file    Minutes per session: Not on file  . Stress: Not on file  Relationships  . Social Herbalist on phone: Not on file    Gets together: Not on file    Attends religious service: Not on file     Active member of club or organization: Not on file    Attends meetings of clubs or organizations: Not on file    Relationship status: Not on file  Other Topics Concern  . Not on file  Social History Narrative  . Not on file    His Allergies Are:  Allergies  Allergen Reactions  . Lisinopril Swelling    Facial swelling  :   His Current Medications Are:  Outpatient Encounter Medications as of 12/31/2018  Medication Sig  . amLODipine (NORVASC) 10 MG tablet Take 1 tablet (10 mg total) by mouth daily.  Marland Kitchen aspirin EC 81 MG EC tablet Take 1 tablet (81 mg total) by mouth daily.  Marland Kitchen atorvastatin (LIPITOR) 80 MG tablet Take 1 tablet (80 mg total) by mouth daily at 6 PM.  . carvedilol (COREG) 12.5 MG tablet Take 1 tablet (12.5 mg total) by mouth 2 (two) times daily.  . chlorthalidone (HYGROTON) 25 MG tablet Take 0.5 tablets (12.5 mg total) by mouth daily.  . clopidogrel (PLAVIX) 75 MG tablet Take 1 tablet (75 mg total) by mouth daily.  . Evolocumab (REPATHA SURECLICK) 970 MG/ML SOAJ Inject 140 mg into the skin every 14 (fourteen) days.  . hydrALAZINE (APRESOLINE) 50 MG tablet Take 1 tablet (50 mg total) by mouth 3 (three) times daily.   No facility-administered encounter medications on file as of 12/31/2018.   :  Review of Systems:  Out of a complete 14 point review of systems, all are reviewed and negative with the exception of these symptoms as listed below: Review of Systems  Neurological:       Pt presents today to discuss his sleep. Pt has never had a sleep study but does endorse snoring.  Epworth Sleepiness Scale 0= would never doze 1= slight chance of dozing 2= moderate chance of dozing 3= high chance of dozing  Sitting and reading: 1 Watching TV: 3 Sitting inactive in a public place (ex. Theater or meeting): 0 As a passenger in a car for an hour without a break: 2 Lying down to rest in the afternoon: 3 Sitting and talking to someone: 1 Sitting quietly after lunch (no  alcohol): 0 In a car, while stopped in traffic: 0 Total: 10     Objective:  Neurological Exam  Physical Exam Physical Examination:   Vitals:   12/31/18 1521  BP: (!) 141/89  Pulse: (!) 55   General Examination: The patient is a very pleasant 53 y.o. male in no acute distress. He appears well-developed and well-nourished and well groomed.   HEENT: Normocephalic, atraumatic, pupils are equal, round  and reactive to light, extraocular tracking is good without limitation to gaze excursion or nystagmus noted. Corrective eyeglasses in place. Hearing is grossly intact. Face is symmetric with normal facial animation. Speech is clear with no dysarthria noted. There is no hypophonia. There is no lip, neck/head, jaw or voice tremor. Neck is supple with full range of passive and active motion. There are no carotid bruits on auscultation. Oropharynx exam reveals: mild mouth dryness, adequate dental hygiene and moderate airway crowding, due to Small airway entry and redundant soft palate, wider tongue, tonsils of 1+ bilaterally. Mallampati is class II. Tongue protrudes centrally and palate elevates symmetrically. Neck circumference is 17-3/8 inches.  He has a minimal overbite.  Chest: Clear to auscultation without wheezing, rhonchi or crackles noted.  Heart: S1+S2+0, regular and normal without murmurs, rubs or gallops noted.   Abdomen: Soft, non-tender and non-distended with normal bowel sounds appreciated on auscultation.  Extremities: There is no pitting edema in the distal lower extremities bilaterally.   Skin: Warm and dry without trophic changes noted.   Musculoskeletal: exam reveals no obvious joint deformities, tenderness or joint swelling or erythema.   Neurologically:  Mental status: The patient is awake, alert and oriented in all 4 spheres. His immediate and remote memory, attention, language skills and fund of knowledge are appropriate. There is no evidence of aphasia, agnosia, apraxia  or anomia. Speech is clear with normal prosody and enunciation. Thought process is linear. Mood is normal and affect is normal.  Cranial nerves II - XII are as described above under HEENT exam.  Motor exam: Normal bulk, strength and tone is noted. There is no tremor, Romberg is negative. Fine motor skills and coordination: grossly intact.  Cerebellar testing: No dysmetria or intention tremor. There is no truncal or gait ataxia.  Sensory exam: intact to light touch in the upper and lower extremities.  Gait, station and balance: He stands easily. No veering to one side is noted. No leaning to one side is noted. Posture is age-appropriate and stance is narrow based. Gait shows normal stride length and normal pace. No problems turning are noted.  Assessment and Plan:  In summary, Thomas [name] is a very pleasant 53 y.o.-year old male with an underlying medical history of stroke, substance abuse, smoking, hypertension, hyperlipidemia, chronic kidney disease, mood disorder, cardiomegaly and borderline overweight state, whose history and physical exam are concerning for obstructive sleep apnea (OSA). I had a long chat with the patient about my findings and the diagnosis of OSA, its prognosis and treatment options. We talked about medical treatments, surgical interventions and non-pharmacological approaches. I explained in particular the risks and ramifications of untreated moderate to severe OSA, especially with respect to developing cardiovascular disease down the Road, including congestive heart failure, difficult to treat hypertension, cardiac arrhythmias, or stroke. Even type 2 diabetes has, in part, been linked to untreated OSA. Symptoms of untreated OSA include daytime sleepiness, memory problems, mood irritability and mood disorder such as depression and anxiety, lack of energy, as well as recurrent headaches, especially morning headaches. We talked about complete smoking cessation and trying to maintain  a healthy lifestyle in general, as well as the importance of weight control. We also talked about the importance of good sleep hygiene. I recommended the following at this time: sleep study.  I explained the sleep test procedure to the patient and also outlined possible surgical and non-surgical treatment options of OSA.  I explained the CPAP treatment option to the patient, who indicated  that he would be willing to try CPAP if the need arises. I explained the importance of being compliant with PAP treatment, not only for insurance purposes but primarily to improve His symptoms, and for the patient's long term health benefit, including to reduce His cardiovascular risks. I answered all his questions today and the patient was in agreement. I plan to see him back after the sleep study is completed and encouraged him to call with any interim questions, concerns, problems or updates.   Thank you very much for allowing me to participate in the care of this nice patient. If I can be of any further assistance to you please do not hesitate to talk to me.   Sincerely,   Huston FoleySaima Vannessa [place]own, MD, PhD

## 2019-01-01 ENCOUNTER — Ambulatory Visit (INDEPENDENT_AMBULATORY_CARE_PROVIDER_SITE_OTHER): Payer: Medicaid Other | Admitting: Internal Medicine

## 2019-01-01 ENCOUNTER — Encounter: Payer: Self-pay | Admitting: Internal Medicine

## 2019-01-01 ENCOUNTER — Other Ambulatory Visit: Payer: Self-pay

## 2019-01-01 VITALS — BP 147/93 | HR 51 | Ht 75.0 in | Wt 212.0 lb

## 2019-01-01 DIAGNOSIS — I639 Cerebral infarction, unspecified: Secondary | ICD-10-CM

## 2019-01-01 DIAGNOSIS — E7849 Other hyperlipidemia: Secondary | ICD-10-CM | POA: Diagnosis not present

## 2019-01-01 DIAGNOSIS — F17200 Nicotine dependence, unspecified, uncomplicated: Secondary | ICD-10-CM

## 2019-01-01 DIAGNOSIS — I10 Essential (primary) hypertension: Secondary | ICD-10-CM | POA: Diagnosis not present

## 2019-01-01 DIAGNOSIS — N182 Chronic kidney disease, stage 2 (mild): Secondary | ICD-10-CM

## 2019-01-01 NOTE — Progress Notes (Signed)
Cardiology Office Note:    Date:  01/01/2019   ID:  Thomas LibmanSteven L Montemurro, DOB 06/19/1965, MRN [id number]  PCP:  Fleet ContrasAvbuere, Edwin, MD  Cardiologist:  Parke PoissonGayatri A Kiondre Grenz, MD  Electrophysiologist:  None   Referring MD: Fleet ContrasAvbuere, Edwin, MD   Chief Complaint: f/u HTN  History of Present Illness:    Thomas LibmanSteven L [name] is a 53 y.o. male with a hx of previous CVAs, hyperlipidemia, CKD stage III, current smoking, prior drug use, and angioedema with ACE inhibitors who presents today for follow-up of hypertension.  He is feeling well and has no acute concerns.  I appreciate the input of my colleague Dr. Blenda NicelyAlvstad Pharm.D. in this patient's care in HTN clinic.  At their last visit he had an improvement in his blood pressure and chlorthalidone was added.  His medication regimen now includes amlodipine 10 mg daily, aspirin 81 mg daily, atorvastatin 80 mg daily, carvedilol 12.5 mg twice daily, clopidogrel 75 mg daily, hydralazine 50 mg 3 times daily.  Chlorthalidone was added at 12.5 mg daily.  His blood pressure is much better controlled today and the patient feels well blood pressure is 147/93.  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 The patient denies chest pain, chest pressure, dyspnea at rest or with exertion, palpitations, PND, orthopnea, or leg swelling. Denies syncope or presyncope. Denies dizziness or lightheadedness. Denies snoring.  Past Medical History:  Diagnosis Date  . Abnormal EKG   . Bipolar disorder (HCC)   . Cardiomegaly   . CKD (chronic kidney disease), stage II   . Family history of heart disease   . H/O medication noncompliance   . Hyperlipidemia   . Hypertension   . Stroke (HCC) 11/07/2015  . Tobacco abuse     Past Surgical History:  Procedure Laterality Date  . ANKLE FRACTURE SURGERY    . NM MYOCAR PERF WALL MOTION  06/28/2011   protocol Bruce, normal perfusion nin all regions, post stress EF  57%,, exercise cap 13METS  . TRANSTHORACIC ECHOCARDIOGRAM  06/28/2011   EF=55%, Proximal septal thickening, borderline LA enlargement, boarderline aortic root dialation    Current Medications: Current Meds  Medication Sig  . amLODipine (NORVASC) 10 MG tablet Take 1 tablet (10 mg total) by mouth daily.  Marland Kitchen. aspirin EC 81 MG EC tablet Take 1 tablet (81 mg total) by mouth daily.  Marland Kitchen. atorvastatin (LIPITOR) 80 MG tablet Take 1 tablet (80 mg total) by mouth daily at 6 PM.  . carvedilol (COREG) 12.5 MG tablet Take 1 tablet (12.5 mg total) by mouth 2 (two) times daily.  . chlorthalidone (HYGROTON) 25 MG tablet Take 0.5 tablets (12.5 mg total) by mouth daily.  . clopidogrel (PLAVIX) 75 MG tablet Take 1 tablet (75 mg total) by mouth daily.  . Evolocumab (REPATHA SURECLICK) 140 MG/ML SOAJ Inject 140 mg into the skin every 14 (fourteen) days.  . hydrALAZINE (APRESOLINE) 50 MG tablet Take 1 tablet (50 mg total) by mouth 3 (three) times daily.     Allergies:   Lisinopril   Social History   Socioeconomic History  . Marital status: Married    Spouse name: Not on file  . Number of children: Not on file  . Years of education: Not on file  . Highest education level: Not on file  Occupational History  . Not on file  Tobacco Use  . Smoking status: Former Smoker    Packs/day: 0.25  Years: 15.00    Pack years: 3.75    Types: Cigarettes    Quit date: 11/06/2015    Years since quitting: 3.1  . Smokeless tobacco: Never Used  . Tobacco comment: quit in Juy 2015  Substance and Sexual Activity  . Alcohol use: Not Currently    Alcohol/week: 12.0 standard drinks    Types: 12 Cans of beer per week    Comment: 12 pack on weekends   . Drug use: Yes    Types: Marijuana  . Sexual activity: Not on file  Other Topics Concern  . Not on file  Social History Narrative  . Not on file   Social Determinants of Health   Financial Resource Strain:   . Difficulty of Paying Living Expenses: Not on file  Food  Insecurity:   . Worried About Programme researcher, broadcasting/film/video in the Last Year: Not on file  . Ran Out of Food in the Last Year: Not on file  Transportation Needs:   . Lack of Transportation (Medical): Not on file  . Lack of Transportation (Non-Medical): Not on file  Physical Activity:   . Days of Exercise per Week: Not on file  . Minutes of Exercise per Session: Not on file  Stress:   . Feeling of Stress : Not on file  Social Connections:   . Frequency of Communication with Friends and Family: Not on file  . Frequency of Social Gatherings with Friends and Family: Not on file  . Attends Religious Services: Not on file  . Active Member of Clubs or Organizations: Not on file  . Attends Banker Meetings: Not on file  . Marital Status: Not on file     Family History: The patient's family history includes Asthma in his daughter and son; Diabetes in his father, maternal grandmother, mother, and sister; Hyperlipidemia in his maternal grandmother; Hypertension in his father, mother, and sister; Stroke in his father, mother, and sister.  ROS:   Please see the history of present illness.    All other systems reviewed and are negative.  EKGs/Labs/Other Studies Reviewed:    The following studies were reviewed today:  EKG:  No new   Epworth Sleepiness Scale: n/a  Recent Labs: 06/06/2018: ALT 24; Hemoglobin 17.0; Platelets 181 12/02/2018: BUN 19; Creatinine, Ser 2.01; Potassium 3.9; Sodium 141  Recent Lipid Panel    Component Value Date/Time   CHOL 188 06/07/2018 0452   TRIG 70 06/07/2018 0452   HDL 38 (L) 06/07/2018 0452   CHOLHDL 4.9 06/07/2018 0452   VLDL 14 06/07/2018 0452   LDLCALC 136 (H) 06/07/2018 0452    Physical Exam:    VS:  BP (!) 147/93   Pulse (!) 51   Ht 6\' 3"  (1.905 m)   Wt 212 lb (96.2 kg)   SpO2 99%   BMI 26.50 kg/m     Wt Readings from Last 5 Encounters:  01/01/19 212 lb (96.2 kg)  12/31/18 214 lb (97.1 kg)  12/16/18 207 lb 12.8 oz (94.3 kg)    12/10/18 211 lb 3.2 oz (95.8 kg)  12/02/18 208 lb 6.4 oz (94.5 kg)     Constitutional: No acute distress Eyes: sclera non-icteric, normal conjunctiva and lids ENMT: normal dentition, moist mucous membranes Cardiovascular: regular rhythm, normal rate, no murmurs. S1 and S2 normal. Radial pulses normal bilaterally. No jugular venous distention.  Respiratory: clear to auscultation bilaterally GI : normal bowel sounds, soft and nontender. No distention.   MSK: extremities warm, well perfused.  No edema.  NEURO: grossly nonfocal exam, moves all extremities. PSYCH: alert and oriented x 3, normal mood and affect.      ASSESSMENT:    1. Accelerated hypertension   2. Other hyperlipidemia   3. TOBACCO ABUSE   4. Cerebrovascular accident (CVA), unspecified mechanism ([place])   5. CKD (chronic kidney disease), stage II    PLAN:    Hypertension-he is on a good regimen of medications today, with much better blood pressure control.  He is still not at goal, however it is stable on his new regimen and clearly shows compliance with improved readings.  Addendum: On review after our visit I would like for the patient to increase his chlorthalidone to 25 mg daily and have a BMP 1 week after the increase in dose.  I think this may be the next best step to get his blood pressure below 140/90.  I will have Trixie Dredge, RN contact the patient with this recommendation and new prescription as needed.  Hyperlipidemia-patient is currently on Repatha and tolerating well.  Continues on atorvastatin 80 mg daily.  Tobacco use-encourage cessation  CVA-continue aspirin and Plavix   TIME SPENT WITH PATIENT: 25 minutes of direct patient care. More than 50% of that time was spent on coordination of care and counseling regarding the above issues.  Cherlynn Kaiser, MD [place]  [facility]   Medication Adjustments/Labs and Tests Ordered: Current medicines are reviewed at length with the patient today.   Concerns regarding medicines are outlined above.  No orders of the defined types were placed in this encounter.  No orders of the defined types were placed in this encounter.   Patient Instructions  Medication Instructions: NO CHANGES If you need a refill on your cardiac medications before your next appointment, please call your pharmacy*  Lab Work: NOT NEEDED If you have labs (blood work) drawn today and your tests are completely normal, you will receive your results only by: Marland Kitchen MyChart Message (if you have MyChart) OR . A paper copy in the mail If you have any lab test that is abnormal or we need to change your treatment, we will call you to review the results.   Follow-Up: At [facility], you and your health needs are our priority.  As part of our continuing mission to provide you with exceptional heart care, we have created designated Provider Care Teams.  These Care Teams include your primary Cardiologist (physician) and Advanced Practice Providers (APPs -  Physician Assistants and Nurse Practitioners) who all work together to provide you with the care you need, when you need it.  Your next appointment:   3 month(s)  The format for your next appointment:   In Person  Provider:   Cherlynn Kaiser, MD  Other Instructions Your physician recommends that you schedule a follow-up appointment in: [place]

## 2019-01-01 NOTE — Patient Instructions (Signed)
Medication Instructions: NO CHANGES If you need a refill on your cardiac medications before your next appointment, please call your pharmacy*  Lab Work: NOT NEEDED If you have labs (blood work) drawn today and your tests are completely normal, you will receive your results only by: Marland Kitchen MyChart Message (if you have MyChart) OR . A paper copy in the mail If you have any lab test that is abnormal or we need to change your treatment, we will call you to review the results.   Follow-Up: At [facility], you and your health needs are our priority.  As part of our continuing mission to provide you with exceptional heart care, we have created designated Provider Care Teams.  These Care Teams include your primary Cardiologist (physician) and Advanced Practice Providers (APPs -  Physician Assistants and Nurse Practitioners) who all work together to provide you with the care you need, when you need it.  Your next appointment:   3 month(s)  The format for your next appointment:   In Person  Provider:   Cherlynn Kaiser, MD  Other Instructions Your physician recommends that you schedule a follow-up appointment in: [place]

## 2019-01-19 ENCOUNTER — Telehealth: Payer: Self-pay | Admitting: *Deleted

## 2019-01-19 DIAGNOSIS — Z79899 Other long term (current) drug therapy: Secondary | ICD-10-CM

## 2019-01-19 DIAGNOSIS — N182 Chronic kidney disease, stage 2 (mild): Secondary | ICD-10-CM

## 2019-01-19 DIAGNOSIS — I10 Essential (primary) hypertension: Secondary | ICD-10-CM

## 2019-01-19 MED ORDER — CHLORTHALIDONE 25 MG PO TABS: 25.0000 mg | ORAL_TABLET | Freq: Every day | ORAL | 4 refills | Status: DC

## 2019-01-19 NOTE — Telephone Encounter (Signed)
INFORMATION GIVE TO DAUGHTER . WITH CALENDER AND LAB SLIP.  VOICE UNDERSTANDING

## 2019-01-19 NOTE — Telephone Encounter (Signed)
-----   Message from Elouise Munroe, MD sent at 01/13/2019 11:28 AM EST ----- Can you call Mr. Skelly and tell him I reviewed his chart after our visit and I want him to increase his chlorthalidone to 25 mg daily? And have a BMET about 1 week after the increase in dose. Please arrange a fu with Tommy Medal in mid January to check in on this.  I've cc'ed [place].   Thanks! GA

## 2019-01-27 ENCOUNTER — Ambulatory Visit (INDEPENDENT_AMBULATORY_CARE_PROVIDER_SITE_OTHER): Payer: Medicaid Other | Admitting: Neurology

## 2019-01-27 DIAGNOSIS — R0683 Snoring: Secondary | ICD-10-CM

## 2019-01-27 DIAGNOSIS — R351 Nocturia: Secondary | ICD-10-CM

## 2019-01-27 DIAGNOSIS — G4720 Circadian rhythm sleep disorder, unspecified type: Secondary | ICD-10-CM

## 2019-01-27 DIAGNOSIS — G4710 Hypersomnia, unspecified: Secondary | ICD-10-CM

## 2019-01-27 DIAGNOSIS — F17200 Nicotine dependence, unspecified, uncomplicated: Secondary | ICD-10-CM

## 2019-01-27 DIAGNOSIS — Z8673 Personal history of transient ischemic attack (TIA), and cerebral infarction without residual deficits: Secondary | ICD-10-CM

## 2019-01-27 DIAGNOSIS — G4761 Periodic limb movement disorder: Secondary | ICD-10-CM

## 2019-01-27 DIAGNOSIS — G4719 Other hypersomnia: Secondary | ICD-10-CM

## 2019-02-03 ENCOUNTER — Telehealth: Payer: Self-pay

## 2019-02-03 NOTE — Telephone Encounter (Signed)
-----   Message from Huston Foley, MD sent at 02/03/2019  8:25 AM EST ----- Patient referred by Thomas [name], seen by me on 12/31/18, diagnostic PSG on 01/27/19.   Please call and notify the patient that the recent sleep study did not show any significant obstructive sleep apnea with the exception of mild snoring and mild, supine OSA; CPAP therapy is not warranted. Weight loss and avoidance of the supine sleep position will likely aid in reducing his mild sleep disordered breathing. He also had mild leg movements during sleep, which occasionally disrupted his sleep. Overall, he did not sleep very well, had a lot of light stage sleep and disrupted sleep.  He can FU with Shanda Bumps as scheduled.  Thanks,  Huston Foley, MD, PhD [facility])

## 2019-02-03 NOTE — Progress Notes (Signed)
Patient referred by Ihor Austin, seen by me on 12/31/18, diagnostic PSG on 01/27/19.   Please call and notify the patient that the recent sleep study did not show any significant obstructive sleep apnea with the exception of mild snoring and mild, supine OSA; CPAP therapy is not warranted. Weight loss and avoidance of the supine sleep position will likely aid in reducing his mild sleep disordered breathing. He also had mild leg movements during sleep, which occasionally disrupted his sleep. Overall, he did not sleep very well, had a lot of light stage sleep and disrupted sleep.  He can FU with Shanda Bumps as scheduled.  Thanks,  Huston Foley, MD, PhD [facility])

## 2019-02-03 NOTE — Procedures (Signed)
PATIENT'S NAME:  Thomas [name], Tandon DOB:      [birth year]      MR#:    [id number]     DATE OF RECORDING: 01/27/2019 REFERRING M.D.:  Ihor Austin, NP  Study Performed:   Baseline Polysomnogram HISTORY: 54 year old man with a history of stroke, substance abuse, smoking, hypertension, hyperlipidemia, chronic kidney disease, mood disorder, cardiomegaly and borderline overweight state, who reports snoring and excessive daytime somnolence. The patient endorsed the Epworth Sleepiness Scale at 10 points. The patient's weight 214 pounds with a height of 75 (inches), resulting in a BMI of 26.6 kg/m2. The patient's neck circumference measured 17.5 inches.  CURRENT MEDICATIONS: Norvasc, Aspirin, Lipitor, Coreg, Hygroton, Plavix, Apresoline   PROCEDURE:  This is a multichannel digital polysomnogram utilizing the Somnostar 11.2 system.  Electrodes and sensors were applied and monitored per AASM Specifications.   EEG, EOG, Chin and Limb EMG, were sampled at 200 Hz.  ECG, Snore and Nasal Pressure, Thermal Airflow, Respiratory Effort, CPAP Flow and Pressure, Oximetry was sampled at 50 Hz. Digital video and audio were recorded.      BASELINE STUDY  Lights Out was at 21:39 and Lights On at 05:01.  Total recording time (TRT) was 442 minutes, with a total sleep time (TST) of 290.5 minutes.   The patient's sleep latency was 69.5 minutes, which is delayed. REM latency was 69.5 minutes, which is slightly reduced. The sleep efficiency was 65.7%, which is reduced.     SLEEP ARCHITECTURE: WASO (Wake after sleep onset) was 125 minutes with moderate sleep fragmentation noted. There were 72 minutes in Stage N1, 75.5 minutes Stage N2, 53.5 minutes Stage N3 and 89.5 minutes in Stage REM.  The percentage of Stage N1 was 24.8%, which is markedly increased, Stage N2 was 26.%, which is reduced, Stage N3 was 18.4% and Stage R (REM sleep) was 30.8%, which is increased. The arousals were noted as: 66 were spontaneous, 28 were associated with  PLMs, 12 were associated with respiratory events.  RESPIRATORY ANALYSIS:  There were a total of 14 respiratory events:  0 obstructive apneas, 2 central apneas and 12 mixed apneas with a total of 14 apneas and an apnea index (AI) of 2.9 /hour. There were 0 hypopneas with a hypopnea index of 0 /hour. The patient also had 0 respiratory event related arousals (RERAs).      The total APNEA/HYPOPNEA INDEX (AHI) was 2.9/hour and the total RESPIRATORY DISTURBANCE INDEX was  2.9 /hour.  0 events occurred in REM sleep and 14 events in NREM. The REM AHI was  0 /hour, versus a non-REM AHI of 4.2. The patient spent 51 minutes of total sleep time in the supine position and 240 minutes in non-supine.. The supine AHI was 8.2 versus a non-supine AHI of 1.8.  OXYGEN SATURATION & C02:  The Wake baseline 02 saturation was 96%, with the lowest being 85%. Time spent below 89% saturation equaled 3 minutes.  PERIODIC LIMB MOVEMENTS: The patient had a total of 99 Periodic Limb Movements.  The Periodic Limb Movement (PLM) index was 20.4 and the PLM Arousal index was 5.8/hour.  Audio and video analysis did not show any abnormal or unusual movements, behaviors, phonations or vocalizations. The patient took no bathroom breaks. Mild snoring was noted. The EKG was in keeping with normal sinus rhythm (NSR).  Post-study, the patient indicated that sleep was the same as usual.   IMPRESSION:  1. Primary Snoring 2. Periodic Limb Movement Disorder (PLMD) 3. Dysfunctions associated with sleep stages or arousal  from sleep  RECOMMENDATIONS:  1. This study does not demonstrate any significant obstructive or central sleep disordered breathing with the exception of mild snoring and mild, supine OSA; CPAP therapy is not warranted. Weight loss and avoidance of the supine sleep position will likely aid in reducing his mild sleep disordered breathing.  2. Mild PLMs (periodic limb movements of sleep) were noted during this study with  minimal arousals; clinical correlation is recommended. This study shows sleep fragmentation and abnormal sleep stage percentages; these are nonspecific findings and per se do not signify an intrinsic sleep disorder or a cause for the patient's sleep-related symptoms. Causes include (but are not limited to) the first night effect of the sleep study, circadian rhythm disturbances, medication effect or an underlying mood disorder or medical problem.  3. The patient should be cautioned not to drive, work at heights, or operate dangerous or heavy equipment when tired or sleepy. Review and reiteration of good sleep hygiene measures should be pursued with any patient. 4. The patient will be advised to follow up with the referring provider, who will be notified of the test results.  I certify that I have reviewed the entire raw data recording prior to the issuance of this report in accordance with the Standards of Accreditation of the American Academy of Sleep Medicine (AASM)    Star Age, MD, PhD Diplomat, American Board of Neurology and Sleep Medicine (Neurology and Sleep Medicine)

## 2019-02-03 NOTE — Telephone Encounter (Signed)
Lvm asking for patient to call back so we could review sleep study results.

## 2019-02-04 NOTE — Telephone Encounter (Signed)
I reached out to the pt and reviewed sleep study results.  Pt verbalized understanding and had no questions at this time.

## 2019-02-11 LAB — BASIC METABOLIC PANEL
BUN/Creatinine Ratio: 8 — ABNORMAL LOW (ref 9–20)
BUN: 15 mg/dL (ref 6–24)
CO2: 25 mmol/L (ref 20–29)
Calcium: 9.9 mg/dL (ref 8.7–10.2)
Chloride: 102 mmol/L (ref 96–106)
Creatinine, Ser: 1.84 mg/dL — ABNORMAL HIGH (ref 0.76–1.27)
GFR calc Af Amer: 48 mL/min/{1.73_m2} — ABNORMAL LOW (ref >59)
GFR calc non Af Amer: 41 mL/min/{1.73_m2} — ABNORMAL LOW (ref >59)
Glucose: 100 mg/dL — ABNORMAL HIGH (ref 65–99)
Potassium: 3.4 mmol/L — ABNORMAL LOW (ref 3.5–5.2)
Sodium: 142 mmol/L (ref 134–144)

## 2019-02-12 ENCOUNTER — Other Ambulatory Visit: Payer: Self-pay

## 2019-02-12 ENCOUNTER — Ambulatory Visit (INDEPENDENT_AMBULATORY_CARE_PROVIDER_SITE_OTHER): Payer: Medicaid Other | Admitting: Pharmacist

## 2019-02-12 VITALS — BP 162/112 | HR 54 | Resp 14 | Ht 75.0 in | Wt 205.0 lb

## 2019-02-12 DIAGNOSIS — I10 Essential (primary) hypertension: Secondary | ICD-10-CM | POA: Diagnosis not present

## 2019-02-12 DIAGNOSIS — Z79899 Other long term (current) drug therapy: Secondary | ICD-10-CM | POA: Diagnosis not present

## 2019-02-12 MED ORDER — HYDRALAZINE HCL 100 MG PO TABS: 100.0000 mg | ORAL_TABLET | Freq: Two times a day (BID) | ORAL | 1 refills | Status: DC

## 2019-02-12 NOTE — Progress Notes (Signed)
HPI:  Thomas [name] is a 54 y.o. male patient of Dr Thomas [name], with a PMH below who presents today for hypertension clinic follow up (see PMH below).  Patient has had problems with compliance, in part due to memory issues brought about by multiple CVA/TIA.  Per patient and daughter, most recent TIA was in August, it was his 5th.   He has been seen in the hypertension clinic several times in the past month.  His daughter has been helping him with medications, staying on track and being compliant.  Over this time we have increased his carvedilol, added amlodipine and titrated it to 10 mg.      Today he returns for follow up and denies increase fatigue, dizziness,welling or any other ADR. Admits issues taking hydralazine 3 times daily and most days is able to [place] 2 doses. All other medication with once daily or BID doses are easy to get all doses in as prescribed.    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  hyperlipidemia LDL  136  CK3 SCr 2.07  Tobacco disorder 1-2 cigarettes per day  Drug abuse Tested positive for cocaine and THC in May 2020     Blood Pressure Goal:  130/80  Current Medications:  Amlodipine 10mg  daily carvedilol 12.5 mg twice daily Chlorthalidone 25mg  daily hydralaine 50 mg three times daily (10am , 6pm, 10pm);    Family Hx: mother hypertension, now 62; father diabetes, hypertension, died at 54-70; sister died from stroke at 70 (half-sister on dad's side); 4 children - 2 with asthma and epilepsy, 1 with just asthma  Social Hx: no alcohol, coffee daily, some soda Pepsi (sips from someone else's can); one cigarette today  Diet: notes eating more home cooked foods in past few weeks   Exercise:  Increased waking 30 minutes three times weekly.  Has not played any basketball recently  Home BP readings: just got home cuff.  Per daughter 170-190/100+   Intolerances:  Lisinopril - angoiedema  Labs:  05/2018:  TC 188, TG 79, HDL 38, LDL 136   Na 138, K 4.6,  Glu 90, BUN 25, SCr 2.10 CrCl (with wt 92.3kg) 53.7  Wt Readings from Last 3 Encounters:  02/12/19 205 lb (93 kg)  01/01/19 212 lb (96.2 kg)  12/31/18 214 lb (97.1 kg)   BP Readings from Last 3 Encounters:  02/12/19 (!) 162/112  01/01/19 (!) 147/93  12/31/18 (!) 141/89   Pulse Readings from Last 3 Encounters:  02/12/19 (!) 54  01/01/19 (!) 51  12/31/18 (!) 55    Current Outpatient Medications  Medication Sig Dispense Refill  . amLODipine (NORVASC) 10 MG tablet Take 1 tablet (10 mg total) by mouth daily. 90 tablet 3  . aspirin EC 81 MG EC tablet Take 1 tablet (81 mg total) by mouth daily. 30 tablet 1  . atorvastatin (LIPITOR) 80 MG tablet Take 1 tablet (80 mg total) by mouth daily at 6 PM. 90 tablet 3  . carvedilol (COREG) 12.5 MG tablet Take 1 tablet (12.5 mg total) by mouth 2 (two) times daily. 180 tablet 3  . chlorthalidone (HYGROTON) 25 MG tablet Take 1 tablet (25 mg total) by mouth daily. 30 tablet 4  . clopidogrel (PLAVIX) 75 MG tablet Take 1 tablet (75 mg total) by mouth daily. 90 tablet 3  . Evolocumab (REPATHA SURECLICK) 140 MG/ML SOAJ Inject 140 mg into the skin every 14 (fourteen) days. 2 pen 11  . hydrALAZINE (APRESOLINE) 100 MG tablet  Take 1 tablet (100 mg total) by mouth 2 (two) times daily. 180 tablet 1   No current facility-administered medications for this visit.    Allergies  Allergen Reactions  . Lisinopril Swelling    Facial swelling    Past Medical History:  Diagnosis Date  . Abnormal EKG   . Bipolar disorder ([place])   . Cardiomegaly   . CKD (chronic kidney disease), stage II   . Family history of heart disease   . H/O medication noncompliance   . Hyperlipidemia   . Hypertension   . Stroke ([place]) 11/07/2015  . Tobacco abuse     Blood pressure (!) 162/112, pulse (!) 54, resp. rate 14, height 6\' 3"  (1.905 m), weight 205 lb (93 kg), SpO2 98 %.  HYPERTENSION, BENIGN ESSENTIAL BP remains above goal and diastolic back above 449. Patient is really  trying to follow regimen at count with stong family support but admits compliance issues with three times daily doses.   Will change hydralazine to 100mg  twice daily to take with carvedilol every day.All other medication will remains as previously prescribed and will follow up in 4 weeks. Repeat BMEt today as well. Plan to consider low dose spironolactone if additional BP control is needed and potassium remains low.   Thomas [name] PharmD, BCPS, [place] [id number] 02/15/2019 7:08 PM

## 2019-02-12 NOTE — Patient Instructions (Addendum)
Return for a  follow up appointment in 4 weeks  Next blood work: 1 week  Check your blood pressure at home daily (if able) and keep record of the readings.  Take your BP meds as follows: *CHANGE hydralazine to 100mg  twice daily*   Bring all of your meds, your BP cuff and your record of home blood pressures to your next appointment.  Exercise as you're able, try to walk approximately 30 minutes per day.  Keep salt intake to a minimum, especially watch canned and prepared boxed foods.  Eat more fresh fruits and vegetables and fewer canned items.  Avoid eating in fast food restaurants.    HOW TO TAKE YOUR BLOOD PRESSURE: . Rest 5 minutes before taking your blood pressure. .  Don't smoke or drink caffeinated beverages for at least 30 minutes before. . Take your blood pressure before (not after) you eat. . Sit comfortably with your back supported and both feet on the floor (don't cross your legs). . Elevate your arm to heart level on a table or a desk. . Use the proper sized cuff. It should fit smoothly and snugly around your bare upper arm. There should be enough room to slip a fingertip under the cuff. The bottom edge of the cuff should be 1 inch above the crease of the elbow. . Ideally, take 3 measurements at one sitting and record the average.

## 2019-02-15 ENCOUNTER — Encounter: Payer: Self-pay | Admitting: Pharmacist

## 2019-02-15 NOTE — Assessment & Plan Note (Addendum)
BP remains above goal and diastolic back above 100. Patient is really trying to follow regimen at count with stong family support but admits compliance issues with three times daily doses.   Will change hydralazine to 100mg  twice daily to take with carvedilol every day.All other medication will remains as previously prescribed and will follow up in 4 weeks. Repeat BMEt today as well. Plan to consider low dose spironolactone if additional BP control is needed and potassium remains low.

## 2019-02-17 ENCOUNTER — Other Ambulatory Visit: Payer: Self-pay | Admitting: *Deleted

## 2019-02-17 DIAGNOSIS — I10 Essential (primary) hypertension: Secondary | ICD-10-CM

## 2019-02-17 DIAGNOSIS — Z79899 Other long term (current) drug therapy: Secondary | ICD-10-CM

## 2019-02-17 DIAGNOSIS — N182 Chronic kidney disease, stage 2 (mild): Secondary | ICD-10-CM

## 2019-02-17 DIAGNOSIS — E7849 Other hyperlipidemia: Secondary | ICD-10-CM

## 2019-02-18 LAB — CBC
Hematocrit: 43.9 % (ref 37.5–51.0)
Hemoglobin: 14.6 g/dL (ref 13.0–17.7)
MCH: 28.2 pg (ref 26.6–33.0)
MCHC: 33.3 g/dL (ref 31.5–35.7)
MCV: 85 fL (ref 79–97)
Platelets: 204 10*3/uL (ref 150–450)
RBC: 5.18 x10E6/uL (ref 4.14–5.80)
RDW: 14.5 % (ref 11.6–15.4)
WBC: 4.1 10*3/uL (ref 3.4–10.8)

## 2019-02-18 LAB — COMPREHENSIVE METABOLIC PANEL
ALT: 16 IU/L (ref 0–44)
AST: 18 IU/L (ref 0–40)
Albumin/Globulin Ratio: 1.6 (ref 1.2–2.2)
Albumin: 4.4 g/dL (ref 3.8–4.9)
Alkaline Phosphatase: 76 IU/L (ref 39–117)
BUN/Creatinine Ratio: 9 (ref 9–20)
BUN: 18 mg/dL (ref 6–24)
Bilirubin Total: 0.7 mg/dL (ref 0.0–1.2)
CO2: 26 mmol/L (ref 20–29)
Calcium: 9.4 mg/dL (ref 8.7–10.2)
Chloride: 101 mmol/L (ref 96–106)
Creatinine, Ser: 1.92 mg/dL — ABNORMAL HIGH (ref 0.76–1.27)
GFR calc Af Amer: 45 mL/min/{1.73_m2} — ABNORMAL LOW (ref >59)
GFR calc non Af Amer: 39 mL/min/{1.73_m2} — ABNORMAL LOW (ref >59)
Globulin, Total: 2.7 g/dL (ref 1.5–4.5)
Glucose: 88 mg/dL (ref 65–99)
Potassium: 3.4 mmol/L — ABNORMAL LOW (ref 3.5–5.2)
Sodium: 142 mmol/L (ref 134–144)
Total Protein: 7.1 g/dL (ref 6.0–8.5)

## 2019-02-18 LAB — LIPID PANEL WITH LDL/HDL RATIO
Cholesterol, Total: 65 mg/dL — ABNORMAL LOW (ref 100–199)
HDL: 46 mg/dL (ref >39)
LDL Chol Calc (NIH): 8 mg/dL (ref 0–99)
LDL/HDL Ratio: 0.2 ratio (ref 0.0–3.6)
Triglycerides: 34 mg/dL (ref 0–149)
VLDL Cholesterol Cal: 11 mg/dL (ref 5–40)

## 2019-03-09 ENCOUNTER — Telehealth: Payer: Self-pay | Admitting: *Deleted

## 2019-03-09 DIAGNOSIS — N182 Chronic kidney disease, stage 2 (mild): Secondary | ICD-10-CM

## 2019-03-09 DIAGNOSIS — I10 Essential (primary) hypertension: Secondary | ICD-10-CM

## 2019-03-09 DIAGNOSIS — N179 Acute kidney failure, unspecified: Secondary | ICD-10-CM

## 2019-03-09 NOTE — Telephone Encounter (Signed)
Per daughter, patient needs another  Referral for nephrologist .  Patient missed initial appointment ,daughter was not aware.   per  Dr Jacques Navy note  From 12/11/17- I would like for Thomas [name] to see nephrology for chronic kidney disease management as well as assistance with hypertension recommendations. We will arrange this follow up.   Order placed. Daughter will contact nephrology's office

## 2019-03-11 ENCOUNTER — Telehealth: Payer: Self-pay

## 2019-03-11 NOTE — Telephone Encounter (Signed)
LMOM TO RESCHEDULE APPT 

## 2019-03-12 ENCOUNTER — Ambulatory Visit: Payer: Medicaid Other

## 2019-03-20 ENCOUNTER — Other Ambulatory Visit: Payer: Self-pay

## 2019-03-20 ENCOUNTER — Ambulatory Visit (INDEPENDENT_AMBULATORY_CARE_PROVIDER_SITE_OTHER): Payer: Medicaid Other | Admitting: Pharmacist

## 2019-03-20 VITALS — BP 146/98 | HR 60 | Ht 75.0 in | Wt 211.4 lb

## 2019-03-20 DIAGNOSIS — I10 Essential (primary) hypertension: Secondary | ICD-10-CM

## 2019-03-20 MED ORDER — POTASSIUM CHLORIDE ER 10 MEQ PO TBCR: 10.0000 meq | EXTENDED_RELEASE_TABLET | Freq: Two times a day (BID) | ORAL | 0 refills | Status: DC

## 2019-03-20 NOTE — Progress Notes (Signed)
HPI:  Thomas [name] is a 54 y.o. male patient of Dr Margaretann Loveless, with a PMH below who presents today for hypertension clinic follow up (see PMH below).  Patient has had problems with compliance, in part due to memory issues brought about by multiple CVA/TIA.  Per patient and daughter, most recent TIA was in August, it was his 5th.   He has been seen in the hypertension clinic several times. His daughter has been helping him with medications, staying on track and being compliant. Today he returns for follow up and denies increase fatigue, dizziness,welling or any other ADR. Patient and daughter confirm compliance with therapy. Patient is scheduled to see a nephrologist 1 week from today.   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  hyperlipidemia LDL  136  CK3 SCr 2.07  Tobacco disorder 1-2 cigarettes per day  Drug abuse Tested positive for cocaine and THC in May 2020     Blood Pressure Goal:  130/80  Current Medications:  Amlodipine 10mg  daily carvedilol 12.5 mg twice daily Chlorthalidone 25mg  daily hydralaine 100mg  twice daily     Family Hx: mother hypertension, now 22; father diabetes, hypertension, died at 61-70; sister died from stroke at 66 (half-sister on dad's side); 4 children - 2 with asthma and epilepsy, 1 with just asthma  Social Hx: no alcohol, coffee daily, some soda Pepsi (sips from someone else's can); one cigarette today  Diet: notes eating more home cooked foods in past few weeks   Exercise:  Increased waking 30 minutes three times weekly.  Has not played any basketball recently  Home BP readings: just got home cuff.  Per daughter 170-190/100+   Intolerances:  Lisinopril - angoiedema  Labs:  05/2018:  TC 188, TG 79, HDL 38, LDL 136   Na 138, K 4.6, Glu 90, BUN 25, SCr 2.10 CrCl (with wt 92.3kg) 53.7  Wt Readings from Last 3 Encounters:  03/20/19 211 lb 6.4 oz (95.9 kg)  02/12/19 205 lb (93 kg)  01/01/19 212 lb (96.2 kg)   BP Readings from Last 3  Encounters:  03/20/19 (!) 146/98  02/12/19 (!) 162/112  01/01/19 (!) 147/93   Pulse Readings from Last 3 Encounters:  03/20/19 60  02/12/19 (!) 54  01/01/19 (!) 51    Current Outpatient Medications  Medication Sig Dispense Refill  . amLODipine (NORVASC) 10 MG tablet Take 1 tablet (10 mg total) by mouth daily. 90 tablet 3  . aspirin EC 81 MG EC tablet Take 1 tablet (81 mg total) by mouth daily. 30 tablet 1  . atorvastatin (LIPITOR) 80 MG tablet Take 1 tablet (80 mg total) by mouth daily at 6 PM. 90 tablet 3  . carvedilol (COREG) 12.5 MG tablet Take 1 tablet (12.5 mg total) by mouth 2 (two) times daily. 180 tablet 3  . chlorthalidone (HYGROTON) 25 MG tablet Take 1 tablet (25 mg total) by mouth daily. 30 tablet 4  . clopidogrel (PLAVIX) 75 MG tablet Take 1 tablet (75 mg total) by mouth daily. 90 tablet 3  . Evolocumab (REPATHA SURECLICK) 093 MG/ML SOAJ Inject 140 mg into the skin every 14 (fourteen) days. 2 pen 11  . hydrALAZINE (APRESOLINE) 100 MG tablet Take 1 tablet (100 mg total) by mouth 2 (two) times daily. 180 tablet 1  . potassium chloride (KLOR-CON) 10 MEQ tablet Take 1 tablet (10 mEq total) by mouth 2 (two) times daily. 6 tablet 0   No current facility-administered medications for this visit.  Allergies  Allergen Reactions  . Lisinopril Swelling    Facial swelling    Past Medical History:  Diagnosis Date  . Abnormal EKG   . Bipolar disorder (HCC)   . Cardiomegaly   . CKD (chronic kidney disease), stage II   . Family history of heart disease   . H/O medication noncompliance   . Hyperlipidemia   . Hypertension   . Stroke (HCC) 11/07/2015  . Tobacco abuse     Blood pressure (!) 146/98, pulse 60, height 6\' 3"  (1.905 m), weight 211 lb 6.4 oz (95.9 kg), SpO2 98 %.  Accelerated hypertension Blood pressure remains above goal but much improved since last office visit. Patient reports compliance and denies problems with therapy. He is scheduled to see a nephrologist  for initat assessment in 2 week.  Will supplement potassium at low dose due to decreased renal function, continue all other medication as prescribed and follow up in 8 weeks. He is already scheduled to see Dr in 1 month.    Gurnie Duris Rodriguez-Guzman PharmD, BCPS, CPP [facility] [address] [place] 03/30/2019 3:28 PM

## 2019-03-20 NOTE — Patient Instructions (Signed)
Return for a a follow up appointment in 8 weeks  Check your blood pressure at home daily (if able) and keep record of the readings.  Take your BP meds as follows: * Take potassium  twice daily for 3 days* Continue all another medication as prescribed*  Bring all of your meds, your BP cuff and your record of home blood pressures to your next appointment.  Exercise as you're able, try to walk approximately 30 minutes per day.  Keep salt intake to a minimum, especially watch canned and prepared boxed foods.  Eat more fresh fruits and vegetables and fewer canned items.  Avoid eating in fast food restaurants.    HOW TO TAKE YOUR BLOOD PRESSURE: . Rest 5 minutes before taking your blood pressure. .  Don't smoke or drink caffeinated beverages for at least 30 minutes before. . Take your blood pressure before (not after) you eat. . Sit comfortably with your back supported and both feet on the floor (don't cross your legs). . Elevate your arm to heart level on a table or a desk. . Use the proper sized cuff. It should fit smoothly and snugly around your bare upper arm. There should be enough room to slip a fingertip under the cuff. The bottom edge of the cuff should be 1 inch above the crease of the elbow. . Ideally, take 3 measurements at one sitting and record the average.

## 2019-03-30 ENCOUNTER — Other Ambulatory Visit: Payer: Self-pay | Admitting: Nephrology

## 2019-03-30 ENCOUNTER — Encounter: Payer: Self-pay | Admitting: Pharmacist

## 2019-03-30 DIAGNOSIS — I10 Essential (primary) hypertension: Secondary | ICD-10-CM

## 2019-03-30 NOTE — Assessment & Plan Note (Signed)
Blood pressure remains above goal but much improved since last office visit. Patient reports compliance and denies problems with therapy. He is scheduled to see a nephrologist for initat assessment in 2 week.  Will supplement potassium at low dose due to decreased renal function, continue all other medication as prescribed and follow up in 8 weeks. He is already scheduled to see Dr Jacques Navy in 1 month.

## 2019-04-01 ENCOUNTER — Ambulatory Visit: Payer: Medicaid Other | Admitting: Internal Medicine

## 2019-04-02 ENCOUNTER — Other Ambulatory Visit: Payer: Self-pay | Admitting: Nephrology

## 2019-04-06 ENCOUNTER — Ambulatory Visit
Admission: RE | Admit: 2019-04-06 | Discharge: 2019-04-06 | Disposition: A | Discharge status: Home / Self Care | Payer: Medicaid Other | Source: Ambulatory Visit | Attending: Nephrology | Admitting: Nephrology

## 2019-04-06 DIAGNOSIS — I10 Essential (primary) hypertension: Secondary | ICD-10-CM

## 2019-04-21 ENCOUNTER — Ambulatory Visit (INDEPENDENT_AMBULATORY_CARE_PROVIDER_SITE_OTHER): Payer: Medicaid Other | Admitting: Pharmacist

## 2019-04-21 ENCOUNTER — Other Ambulatory Visit: Payer: Self-pay

## 2019-04-21 VITALS — BP 146/88 | HR 60 | Ht 75.0 in

## 2019-04-21 DIAGNOSIS — I10 Essential (primary) hypertension: Secondary | ICD-10-CM | POA: Diagnosis not present

## 2019-04-21 NOTE — Patient Instructions (Addendum)
Return for a  follow up appointment 3 months  Check your blood pressure at home daily (if able) and keep record of the readings.  Take your BP meds as follows: * NO CHANGES*  Bring all of your meds, your BP cuff and your record of home blood pressures to your next appointment.  Exercise as you're able, try to walk approximately 30 minutes per day.  Keep salt intake to a minimum, especially watch canned and prepared boxed foods.  Eat more fresh fruits and vegetables and fewer canned items.  Avoid eating in fast food restaurants.    HOW TO TAKE YOUR BLOOD PRESSURE: . Rest 5 minutes before taking your blood pressure. .  Don't smoke or drink caffeinated beverages for at least 30 minutes before. . Take your blood pressure before (not after) you eat. . Sit comfortably with your back supported and both feet on the floor (don't cross your legs). . Elevate your arm to heart level on a table or a desk. . Use the proper sized cuff. It should fit smoothly and snugly around your bare upper arm. There should be enough room to slip a fingertip under the cuff. The bottom edge of the cuff should be 1 inch above the crease of the elbow. . Ideally, take 3 measurements at one sitting and record the average.  '

## 2019-04-21 NOTE — Progress Notes (Signed)
HPI:  Thomas [name] is a 54 y.o. male patient of Dr Jacques Navy, with a PMH below who presents today for hypertension clinic follow up (see PMH below).  Patient has had problems with compliance, in part due to memory issues brought about by multiple CVA/TIA. He has been seen in the hypertension clinic several times. His daughter has been helping him with medications, staying on track and being compliant. Today he returns for follow up and denies increase fatigue, dizziness,welling or any other ADR.    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  hyperlipidemia LDL  136  CK3 SCr 2.07  Tobacco disorder Quit 1 months ago  Drug abuse Tested positive for cocaine and THC in May 2020     Blood Pressure Goal:  130/80 (140/90 if unable to tolerate lower goal)  Current Medications:  Amlodipine 10mg  daily carvedilol 12.5 mg twice daily Chlorthalidone 25mg  daily Hydralazine 100mg  twice daily    Family Hx: mother hypertension, now 40; father diabetes, hypertension, died at 57-70; sister died from stroke at 14 (half-sister on dad's side); 4 children - 2 with asthma and epilepsy, 1 with just asthma  Social Hx: no alcohol, coffee daily, some soda Pepsi (sips from someone else's can); quit smoking 1 months ago.  Diet: notes eating more home cooked foods in past few weeks   Exercise:  Increased waking 30 minutes three times weekly.  Has not played any basketball recently  Home BP readings: just got home cuff.  Per daughter 170-190/100+   Intolerances:  Lisinopril - angoiedema  Labs:  02/18/2019: Scr 1.92, Na 142, K 3.4, Cl 101, CHO 65, TG 34, HDL 46, LDL-c 8  Wt Readings from Last 3 Encounters:  03/20/19 211 lb 6.4 oz (95.9 kg)  02/12/19 205 lb (93 kg)  01/01/19 212 lb (96.2 kg)   BP Readings from Last 3 Encounters:  04/21/19 (!) 146/88  03/20/19 (!) 146/98  02/12/19 (!) 162/112   Pulse Readings from Last 3 Encounters:  04/21/19 60  03/20/19 60  02/12/19 (!) 54    Current  Outpatient Medications  Medication Sig Dispense Refill  . amLODipine (NORVASC) 10 MG tablet Take 1 tablet (10 mg total) by mouth daily. 90 tablet 3  . aspirin EC 81 MG EC tablet Take 1 tablet (81 mg total) by mouth daily. 30 tablet 1  . atorvastatin (LIPITOR) 80 MG tablet Take 1 tablet (80 mg total) by mouth daily at 6 PM. 90 tablet 3  . carvedilol (COREG) 12.5 MG tablet Take 1 tablet (12.5 mg total) by mouth 2 (two) times daily. 180 tablet 3  . chlorthalidone (HYGROTON) 25 MG tablet Take 1 tablet (25 mg total) by mouth daily. 30 tablet 4  . clopidogrel (PLAVIX) 75 MG tablet Take 1 tablet (75 mg total) by mouth daily. 90 tablet 3  . Evolocumab (REPATHA SURECLICK) 140 MG/ML SOAJ Inject 140 mg into the skin every 14 (fourteen) days. 2 pen 11  . hydrALAZINE (APRESOLINE) 100 MG tablet Take 1 tablet (100 mg total) by mouth 2 (two) times daily. 180 tablet 1   No current facility-administered medications for this visit.    Allergies  Allergen Reactions  . Lisinopril Swelling    Facial swelling    Past Medical History:  Diagnosis Date  . Abnormal EKG   . Bipolar disorder (HCC)   . Cardiomegaly   . CKD (chronic kidney disease), stage II   . Family history of heart disease   . H/O medication  noncompliance   . Hyperlipidemia   . Hypertension   . Stroke ([place]) 11/07/2015  . Tobacco abuse     Blood pressure (!) 146/88, pulse 60, height 6\' 3"  (1.905 m), SpO2 96 %.  HYPERTENSION, BENIGN ESSENTIAL Blood pressure remains slightly above goal but significantly improved in the last 3 months. Mr Shifflet quit smoking 1 months ago, and reports compliance with all medication. Will avoid increasing chlorthalidone d/t CKD-III. Amlodipine and hydralazine are at maximum daily dose and carvedilol dose is limited due to low heart rate.  Plan to follow up with DR Margaretann Loveless in 2 weeks and 3 months with HTN clinic.    Kassius Battiste Rodriguez-Guzman PharmD, BCPS, [place] [id number] 04/26/2019 3:12 PM

## 2019-04-26 ENCOUNTER — Encounter: Payer: Self-pay | Admitting: Pharmacist

## 2019-04-26 NOTE — Assessment & Plan Note (Addendum)
Blood pressure remains slightly above goal but significantly improved in the last 3 months. Mr Shiflet quit smoking 1 months ago, and reports compliance with all medication. Will avoid increasing chlorthalidone d/t CKD-III. Amlodipine and hydralazine are at maximum daily dose and carvedilol dose is limited due to low heart rate.  Plan to follow up with DR Jacques Navy in 2 weeks and 3 months with HTN clinic.

## 2019-05-08 ENCOUNTER — Encounter: Payer: Self-pay | Admitting: Internal Medicine

## 2019-05-08 ENCOUNTER — Other Ambulatory Visit: Payer: Self-pay

## 2019-05-08 ENCOUNTER — Ambulatory Visit (INDEPENDENT_AMBULATORY_CARE_PROVIDER_SITE_OTHER): Payer: Medicaid Other | Admitting: Internal Medicine

## 2019-05-08 VITALS — BP 152/96 | HR 51 | Ht 75.0 in | Wt 211.6 lb

## 2019-05-08 DIAGNOSIS — E7800 Pure hypercholesterolemia, unspecified: Secondary | ICD-10-CM | POA: Diagnosis not present

## 2019-05-08 DIAGNOSIS — N182 Chronic kidney disease, stage 2 (mild): Secondary | ICD-10-CM

## 2019-05-08 DIAGNOSIS — I10 Essential (primary) hypertension: Secondary | ICD-10-CM | POA: Diagnosis not present

## 2019-05-08 DIAGNOSIS — I639 Cerebral infarction, unspecified: Secondary | ICD-10-CM

## 2019-05-08 DIAGNOSIS — Z79899 Other long term (current) drug therapy: Secondary | ICD-10-CM

## 2019-05-08 NOTE — Patient Instructions (Addendum)
Medication Instructions:  NO CHANGES *If you need a refill on your cardiac medications before your next appointment, please call your pharmacy*   Lab Work: NOT NEEDED    Testing/Procedures: NOT NEEDED  Follow-Up: At [facility], you and your health needs are our priority.  As part of our continuing mission to provide you with exceptional heart care, we have created designated Provider Care Teams.  These Care Teams include your primary Cardiologist (physician) and Advanced Practice Providers (APPs -  Physician Assistants and Nurse Practitioners) who all work together to provide you with the care you need, when you need it.  We recommend signing up for the patient portal called "MyChart".  Sign up information is provided on this After Visit Summary.  MyChart is used to connect with patients for Virtual Visits (Telemedicine).  Patients are able to view lab/test results, encounter notes, upcoming appointments, etc.  Non-urgent messages can be sent to your provider as well.   To learn more about what you can do with MyChart, go to [email or web address].    Your next appointment:   2 month(s) KEEP APPOINTMENT WITH CVRR=PHARMACIST  The format for your next appointment:   In Person  Provider:   Mercie Eon - will schedule next f/u visit with Dr Jacques Navy    Other Instructions N/A

## 2019-05-08 NOTE — Progress Notes (Signed)
Cardiology Office Note:    Date:  05/08/2019   ID:  Thomas [name], DOB Sep 28, 1965, MRN [id number]  PCP:  Fleet Contras, MD  Cardiologist:  Parke Poisson, MD  Electrophysiologist:  None   Referring MD: Fleet Contras, MD   Chief Complaint: HTN  History of Present Illness:    Thomas [name] is a 54 y.o. male well known to my clinic. His daughter is a CMA in our practice. He presents for continued management of HTN.  He has been doing well on current therapy, and has been setting a timer for his medications.  His blood pressure is much better controlled than our initial encounter.  He was last seen by hypertension clinic on March 30.  Current therapy includes amlodipine 10 mg daily, Coreg 12.5 mg twice daily, chlorthalidone 25 mg daily, and hydralazine 100 mg twice daily.  The patient denies chest pain, chest pressure, dyspnea at rest or with exertion, palpitations, PND, orthopnea, or leg swelling. Denies cough, fever, chills. Denies nausea, vomiting. Denies syncope or presyncope. Denies dizziness or lightheadedness.     Past Medical History:  Diagnosis Date  . Abnormal EKG   . Bipolar disorder (HCC)   . Cardiomegaly   . CKD (chronic kidney disease), stage II   . Family history of heart disease   . H/O medication noncompliance   . Hyperlipidemia   . Hypertension   . Stroke (HCC) 11/07/2015  . Tobacco abuse     Past Surgical History:  Procedure Laterality Date  . ANKLE FRACTURE SURGERY    . NM MYOCAR PERF WALL MOTION  06/28/2011   protocol Bruce, normal perfusion nin all regions, post stress EF 57%,, exercise cap  . TRANSTHORACIC ECHOCARDIOGRAM  06/28/2011   EF=55%, Proximal septal thickening, borderline LA enlargement, boarderline aortic root dialation    Current Medications: Current Meds  Medication Sig  . amLODipine (NORVASC) 10 MG tablet Take 1 tablet (10 mg total) by mouth daily.  Marland Kitchen aspirin EC 81 MG EC tablet Take 1 tablet (81 mg total) by mouth  daily.  Marland Kitchen atorvastatin (LIPITOR) 80 MG tablet Take 1 tablet (80 mg total) by mouth daily at 6 PM.  . carvedilol (COREG) 12.5 MG tablet Take 1 tablet (12.5 mg total) by mouth 2 (two) times daily.  . chlorthalidone (HYGROTON) 25 MG tablet Take 1 tablet (25 mg total) by mouth daily.  . clopidogrel (PLAVIX) 75 MG tablet Take 1 tablet (75 mg total) by mouth daily.  . Evolocumab (REPATHA SURECLICK) 140 MG/ML SOAJ Inject 140 mg into the skin every 14 (fourteen) days.  . hydrALAZINE (APRESOLINE) 100 MG tablet Take 1 tablet (100 mg total) by mouth 2 (two) times daily.     Allergies:   Lisinopril   Social History   Socioeconomic History  . Marital status: Married    Spouse name: Not on file  . Number of children: Not on file  . Years of education: Not on file  . Highest education level: Not on file  Occupational History  . Not on file  Tobacco Use  . Smoking status: Former Smoker    Packs/day: 0.25    Years: 15.00    Pack years: 3.75    Types: Cigarettes    Quit date: 11/06/2015    Years since quitting: 3.5  . Smokeless tobacco: Never Used  . Tobacco comment: quit in Juy 2015  Substance and Sexual Activity  . Alcohol use: Not Currently    Alcohol/week: 12.0 standard drinks  Types: 12 Cans of beer per week    Comment: 12 pack on weekends   . Drug use: Yes    Types: Marijuana  . Sexual activity: Not on file  Other Topics Concern  . Not on file  Social History Narrative  . Not on file   Social Determinants of Health   Financial Resource Strain:   . Difficulty of Paying Living Expenses:   Food Insecurity:   . Worried About Programme researcher, broadcasting/film/video in the Last Year:   . Barista in the Last Year:   Transportation Needs:   . Freight forwarder (Medical):   Marland Kitchen Lack of Transportation (Non-Medical):   Physical Activity:   . Days of Exercise per Week:   . Minutes of Exercise per Session:   Stress:   . Feeling of Stress :   Social Connections:   . Frequency of  Communication with Friends and Family:   . Frequency of Social Gatherings with Friends and Family:   . Attends Religious Services:   . Active Member of Clubs or Organizations:   . Attends Banker Meetings:   Marland Kitchen Marital Status:      Family History: The patient's family history includes Asthma in his daughter and son; Diabetes in his father, maternal grandmother, mother, and sister; Hyperlipidemia in his maternal grandmother; Hypertension in his father, mother, and sister; Stroke in his father, mother, and sister.  ROS:   Please see the history of present illness.    All other systems reviewed and are negative.  EKGs/Labs/Other Studies Reviewed:    The following studies were reviewed today:  EKG:  SB first degree AVB, rate 51, LAE, LAD, iRBBB, LVH with repolarization change.  Recent Labs: 02/18/2019: ALT 16; BUN 18; Creatinine, Ser 1.92; Hemoglobin 14.6; Platelets 204; Potassium 3.4; Sodium 142  Recent Lipid Panel    Component Value Date/Time   CHOL 65 (L) 02/18/2019 1128   TRIG 34 02/18/2019 1128   HDL 46 02/18/2019 1128   CHOLHDL 4.9 06/07/2018 0452   VLDL 14 06/07/2018 0452   LDLCALC 8 02/18/2019 1128    Physical Exam:    VS:  BP (!) 152/96   Pulse (!) 51   Ht 6\' 3"  (1.905 m)   Wt 211 lb 9.6 oz (96 kg)   SpO2 97%   BMI 26.45 kg/m     Wt Readings from Last 5 Encounters:  05/08/19 211 lb 9.6 oz (96 kg)  03/20/19 211 lb 6.4 oz (95.9 kg)  02/12/19 205 lb (93 kg)  01/01/19 212 lb (96.2 kg)  12/31/18 214 lb (97.1 kg)     Constitutional: No acute distress Eyes: sclera non-icteric, normal conjunctiva and lids ENMT: Mask in place Cardiovascular: regular rhythm, normal rate, no murmurs. S1 and S2 normal. Radial pulses normal bilaterally. No jugular venous distention.  Respiratory: clear to auscultation bilaterally GI : normal bowel sounds, soft and nontender. No distention.   MSK: extremities warm, well perfused. No edema.  NEURO: grossly nonfocal exam,  moves all extremities. PSYCH: alert and oriented x 3, normal mood and affect.    ASSESSMENT:    1. Resistant hypertension   2. Pure hypercholesterolemia   3. CKD (chronic kidney disease), stage II   4. Medication management   5. Cerebrovascular accident (CVA), unspecified mechanism (HCC)    PLAN:    Resistant hypertension - Plan: EKG 12-Lead  Pure hypercholesterolemia - Plan: EKG 12-Lead  CKD (chronic kidney disease), stage II  Medication management  Cerebrovascular accident (CVA), unspecified mechanism ([place])   Continue current therapy. Discussed increasing dose of chlorthalidone to continue to optimize however patient and I participated in shared decision making and determined we will observe for now and can consider this at next appointment with HTN clinic. He is not on doxazosin anymore due to difficulty obtaining refills.   For history of stroke, continue ASA and plavix, as well as statin.   Total time of encounter: 20 minutes total time of encounter, including 15 minutes spent in face-to-face patient care on the date of this encounter. This time includes coordination of care and counseling regarding above mentioned problem list. Remainder of non-face-to-face time involved reviewing chart documents/testing relevant to the patient encounter and documentation in the medical record. I have independently reviewed documentation from referring provider.   Cherlynn Kaiser, MD [place]  [facility]    Medication Adjustments/Labs and Tests Ordered: Current medicines are reviewed at length with the patient today.  Concerns regarding medicines are outlined above.  Orders Placed This Encounter  Procedures  . EKG 12-Lead   No orders of the defined types were placed in this encounter.   Patient Instructions  Medication Instructions:  NO CHANGES *If you need a refill on your cardiac medications before your next appointment, please call your pharmacy*   Lab Work: NOT  NEEDED    Testing/Procedures: NOT NEEDED  Follow-Up: At [facility], you and your health needs are our priority.  As part of our continuing mission to provide you with exceptional heart care, we have created designated Provider Care Teams.  These Care Teams include your primary Cardiologist (physician) and Advanced Practice Providers (APPs -  Physician Assistants and Nurse Practitioners) who all work together to provide you with the care you need, when you need it.  We recommend signing up for the patient portal called "MyChart".  Sign up information is provided on this After Visit Summary.  MyChart is used to connect with patients for Virtual Visits (Telemedicine).  Patients are able to view lab/test results, encounter notes, upcoming appointments, etc.  Non-urgent messages can be sent to your provider as well.   To learn more about what you can do with MyChart, go to [email or web address].    Your next appointment:   2 month(s) KEEP APPOINTMENT WITH CVRR=PHARMACIST  The format for your next appointment:   In Person  Provider:   Laury Deep - will schedule next f/u visit with Dr Margaretann Loveless    Other Instructions N/A

## 2019-07-14 ENCOUNTER — Ambulatory Visit: Payer: Medicaid Other

## 2019-07-14 NOTE — Progress Notes (Deleted)
HPI:  Thomas [name] is a 54 y.o. male patient of Dr Jacques Navy, with a PMH below who presents today for hypertension clinic follow up (see PMH below).  Patient has had problems with compliance, in part due to memory issues brought about by multiple CVA/TIA. He has been seen in the hypertension clinic several times. His daughter has been helping him with medications, staying on track and being compliant. Today he returns for follow up and denies increase fatigue, dizziness,welling or any other ADR.    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  hyperlipidemia LDL  136  CK3 SCr 2.07  Tobacco disorder Quit 1 months ago  Drug abuse Tested positive for cocaine and THC in May 2020     Blood Pressure Goal:  130/80 (140/90 if unable to tolerate lower goal)  Current Medications:  Amlodipine 10mg  daily carvedilol 12.5 mg twice daily Chlorthalidone 25mg  daily Hydralazine 100mg  twice daily    Family Hx: mother hypertension, now 63; father diabetes, hypertension, died at 28-70; sister died from stroke at 47 (half-sister on dad's side); 4 children - 2 with asthma and epilepsy, 1 with just asthma  Social Hx: no alcohol, coffee daily, some soda Pepsi (sips from someone else's can); quit smoking 1 months ago.  Diet: notes eating more home cooked foods in past few weeks   Exercise:  Increased waking 30 minutes three times weekly.  Has not played any basketball recently  Home BP readings: just got home cuff.  Per daughter 170-190/100+   Intolerances:  Lisinopril - angoiedema  Labs:  02/18/2019: Scr 1.92, Na 142, K 3.4, Cl 101, CHO 65, TG 34, HDL 46, LDL-c 8  Wt Readings from Last 3 Encounters:  05/08/19 211 lb 9.6 oz (96 kg)  03/20/19 211 lb 6.4 oz (95.9 kg)  02/12/19 205 lb (93 kg)   BP Readings from Last 3 Encounters:  05/08/19 (!) 152/96  04/21/19 (!) 146/88  03/20/19 (!) 146/98   Pulse Readings from Last 3 Encounters:  05/08/19 (!) 51  04/21/19 60  03/20/19 60    Current  Outpatient Medications  Medication Sig Dispense Refill  . amLODipine (NORVASC) 10 MG tablet Take 1 tablet (10 mg total) by mouth daily. 90 tablet 3  . aspirin EC 81 MG EC tablet Take 1 tablet (81 mg total) by mouth daily. 30 tablet 1  . atorvastatin (LIPITOR) 80 MG tablet Take 1 tablet (80 mg total) by mouth daily at 6 PM. 90 tablet 3  . carvedilol (COREG) 12.5 MG tablet Take 1 tablet (12.5 mg total) by mouth 2 (two) times daily. 180 tablet 3  . chlorthalidone (HYGROTON) 25 MG tablet Take 1 tablet (25 mg total) by mouth daily. 30 tablet 4  . clopidogrel (PLAVIX) 75 MG tablet Take 1 tablet (75 mg total) by mouth daily. 90 tablet 3  . Evolocumab (REPATHA SURECLICK) 140 MG/ML SOAJ Inject 140 mg into the skin every 14 (fourteen) days. 2 pen 11  . hydrALAZINE (APRESOLINE) 100 MG tablet Take 1 tablet (100 mg total) by mouth 2 (two) times daily. 180 tablet 1   No current facility-administered medications for this visit.    Allergies  Allergen Reactions  . Lisinopril Swelling    Facial swelling    Past Medical History:  Diagnosis Date  . Abnormal EKG   . Bipolar disorder (HCC)   . Cardiomegaly   . CKD (chronic kidney disease), stage II   . Family history of heart disease   .  H/O medication noncompliance   . Hyperlipidemia   . Hypertension   . Stroke ([place]) 11/07/2015  . Tobacco abuse     There were no vitals taken for this visit.  No problem-specific Assessment & Plan notes found for this encounter.   Tommy Medal PharmD CPP [facility] [address] [place] [address] 07/14/2019 10:37 AM

## 2019-09-09 ENCOUNTER — Telehealth: Payer: Self-pay

## 2019-09-09 NOTE — Telephone Encounter (Signed)
lmom for pharmd appt

## 2019-10-06 ENCOUNTER — Ambulatory Visit: Payer: Medicaid Other

## 2019-10-06 NOTE — Progress Notes (Deleted)
HPI:  Thomas [name] is a 54 y.o. male patient of Dr Jacques Navy, with a PMH below who presents today for hypertension clinic follow up (see PMH below).  Patient has had problems with compliance, in part due to memory issues brought about by multiple CVA/TIA. He has been seen in the hypertension clinic several times. His daughter has been helping him with medications, staying on track and being compliant. Today he returns for follow up and denies increase fatigue, dizziness,welling or any other ADR.    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  hyperlipidemia LDL  136  CK3 SCr 2.07  Tobacco disorder Quit 1 months ago  Drug abuse Tested positive for cocaine and THC in May 2020     Blood Pressure Goal:  130/80 (140/90 if unable to tolerate lower goal)  Current Medications:  Amlodipine 10mg  daily carvedilol 12.5 mg twice daily Chlorthalidone 25mg  daily Hydralazine 100mg  twice daily    Family Hx: mother hypertension, now 63; father diabetes, hypertension, died at 28-70; sister died from stroke at 47 (half-sister on dad's side); 4 children - 2 with asthma and epilepsy, 1 with just asthma  Social Hx: no alcohol, coffee daily, some soda Pepsi (sips from someone else's can); quit smoking 1 months ago.  Diet: notes eating more home cooked foods in past few weeks   Exercise:  Increased waking 30 minutes three times weekly.  Has not played any basketball recently  Home BP readings: just got home cuff.  Per daughter 170-190/100+   Intolerances:  Lisinopril - angoiedema  Labs:  02/18/2019: Scr 1.92, Na 142, K 3.4, Cl 101, CHO 65, TG 34, HDL 46, LDL-c 8  Wt Readings from Last 3 Encounters:  05/08/19 211 lb 9.6 oz (96 kg)  03/20/19 211 lb 6.4 oz (95.9 kg)  02/12/19 205 lb (93 kg)   BP Readings from Last 3 Encounters:  05/08/19 (!) 152/96  04/21/19 (!) 146/88  03/20/19 (!) 146/98   Pulse Readings from Last 3 Encounters:  05/08/19 (!) 51  04/21/19 60  03/20/19 60    Current  Outpatient Medications  Medication Sig Dispense Refill  . amLODipine (NORVASC) 10 MG tablet Take 1 tablet (10 mg total) by mouth daily. 90 tablet 3  . aspirin EC 81 MG EC tablet Take 1 tablet (81 mg total) by mouth daily. 30 tablet 1  . atorvastatin (LIPITOR) 80 MG tablet Take 1 tablet (80 mg total) by mouth daily at 6 PM. 90 tablet 3  . carvedilol (COREG) 12.5 MG tablet Take 1 tablet (12.5 mg total) by mouth 2 (two) times daily. 180 tablet 3  . chlorthalidone (HYGROTON) 25 MG tablet Take 1 tablet (25 mg total) by mouth daily. 30 tablet 4  . clopidogrel (PLAVIX) 75 MG tablet Take 1 tablet (75 mg total) by mouth daily. 90 tablet 3  . Evolocumab (REPATHA SURECLICK) 140 MG/ML SOAJ Inject 140 mg into the skin every 14 (fourteen) days. 2 pen 11  . hydrALAZINE (APRESOLINE) 100 MG tablet Take 1 tablet (100 mg total) by mouth 2 (two) times daily. 180 tablet 1   No current facility-administered medications for this visit.    Allergies  Allergen Reactions  . Lisinopril Swelling    Facial swelling    Past Medical History:  Diagnosis Date  . Abnormal EKG   . Bipolar disorder (HCC)   . Cardiomegaly   . CKD (chronic kidney disease), stage II   . Family history of heart disease   .  H/O medication noncompliance   . Hyperlipidemia   . Hypertension   . Stroke (HCC) 11/07/2015  . Tobacco abuse     There were no vitals taken for this visit.  No problem-specific Assessment & Plan notes found for this encounter.   Phillips Hay PharmD CPP [facility] [address] [place] [address] 10/06/2019 1:43 PM

## 2019-10-19 ENCOUNTER — Encounter (HOSPITAL_COMMUNITY): Payer: Self-pay | Admitting: Emergency Medicine

## 2019-10-19 ENCOUNTER — Ambulatory Visit (HOSPITAL_COMMUNITY)
Admission: EM | Admit: 2019-10-19 | Discharge: 2019-10-19 | Disposition: A | Discharge status: Home / Self Care | Payer: Medicaid Other | Attending: Emergency Medicine | Admitting: Emergency Medicine

## 2019-10-19 ENCOUNTER — Other Ambulatory Visit: Payer: Self-pay

## 2019-10-19 DIAGNOSIS — Z1152 Encounter for screening for COVID-19: Secondary | ICD-10-CM | POA: Diagnosis present

## 2019-10-19 NOTE — Discharge Instructions (Signed)

## 2019-10-19 NOTE — ED Triage Notes (Signed)
Pt presents for covid testing after exposure with family member that tested positive. Denies any symptoms at this time.

## 2019-10-20 LAB — SARS CORONAVIRUS 2 (TAT 6-24 HRS): SARS Coronavirus 2: NEGATIVE

## 2019-11-20 ENCOUNTER — Other Ambulatory Visit: Payer: Self-pay | Admitting: Internal Medicine

## 2019-11-27 LAB — CBC
Hematocrit: 46.9 % (ref 37.5–51.0)
Hemoglobin: 16.1 g/dL (ref 13.0–17.7)
MCH: 28.4 pg (ref 26.6–33.0)
MCHC: 34.3 g/dL (ref 31.5–35.7)
MCV: 83 fL (ref 79–97)
Platelets: 197 10*3/uL (ref 150–450)
RBC: 5.66 x10E6/uL (ref 4.14–5.80)
RDW: 14.5 % (ref 11.6–15.4)
WBC: 3.7 10*3/uL (ref 3.4–10.8)

## 2019-11-27 LAB — LIPID PANEL WITH LDL/HDL RATIO
Cholesterol, Total: 144 mg/dL (ref 100–199)
HDL: 47 mg/dL (ref >39)
LDL Chol Calc (NIH): 85 mg/dL (ref 0–99)
LDL/HDL Ratio: 1.8 ratio (ref 0.0–3.6)
Triglycerides: 58 mg/dL (ref 0–149)
VLDL Cholesterol Cal: 12 mg/dL (ref 5–40)

## 2019-11-27 LAB — BASIC METABOLIC PANEL
BUN/Creatinine Ratio: 9 (ref 9–20)
BUN: 16 mg/dL (ref 6–24)
CO2: 24 mmol/L (ref 20–29)
Calcium: 9.8 mg/dL (ref 8.7–10.2)
Chloride: 103 mmol/L (ref 96–106)
Creatinine, Ser: 1.73 mg/dL — ABNORMAL HIGH (ref 0.76–1.27)
GFR calc Af Amer: 51 mL/min/{1.73_m2} — ABNORMAL LOW (ref >59)
GFR calc non Af Amer: 44 mL/min/{1.73_m2} — ABNORMAL LOW (ref >59)
Glucose: 88 mg/dL (ref 65–99)
Potassium: 3.8 mmol/L (ref 3.5–5.2)
Sodium: 141 mmol/L (ref 134–144)

## 2019-12-08 ENCOUNTER — Telehealth: Payer: Self-pay

## 2019-12-08 NOTE — Telephone Encounter (Signed)
Spoke with tiwan in person since she works here to tell her that he needs new insurance card as he was switched to [employer] and she voiced understanding

## 2019-12-11 ENCOUNTER — Other Ambulatory Visit: Payer: Self-pay

## 2019-12-11 ENCOUNTER — Ambulatory Visit (INDEPENDENT_AMBULATORY_CARE_PROVIDER_SITE_OTHER): Payer: Medicaid Other | Admitting: Pharmacist Clinician (PhC)/ Clinical Pharmacy Specialist

## 2019-12-11 ENCOUNTER — Ambulatory Visit: Payer: Medicaid Other

## 2019-12-11 DIAGNOSIS — I10 Essential (primary) hypertension: Secondary | ICD-10-CM

## 2019-12-11 MED ORDER — CHLORTHALIDONE 25 MG PO TABS
25.0000 mg | ORAL_TABLET | Freq: Every day | ORAL | 3 refills | Status: DC
Start: 2019-12-11 — End: 2021-06-29

## 2019-12-11 MED ORDER — CARVEDILOL 12.5 MG PO TABS: 12.5000 mg | ORAL_TABLET | Freq: Two times a day (BID) | ORAL | 3 refills | Status: DC

## 2019-12-11 MED ORDER — AMLODIPINE BESYLATE 10 MG PO TABS
10.0000 mg | ORAL_TABLET | Freq: Every day | ORAL | 3 refills | Status: DC
Start: 2019-12-11 — End: 2021-07-14

## 2019-12-11 MED ORDER — HYDRALAZINE HCL 100 MG PO TABS
100.0000 mg | ORAL_TABLET | Freq: Two times a day (BID) | ORAL | 1 refills | Status: DC
Start: 2019-12-11 — End: 2021-06-29

## 2019-12-11 NOTE — Assessment & Plan Note (Signed)
Patient with uncontrolled hypertension, due in part to problems with compliance.  It was stressed to him today that if he has problems getting his medications he needs to contact our office immediately, so that we can help find a resolution.  He is to go directly to social services to get his Medicaid information updated and hopefully get a current benefits card today.  If he is unable to get this, he was given a Good Rx card to at least fill the amlodipine and hydralazine today.  We will see him back in 3-4 weeks to re-assess his blood pressure, on medication, and determine next course.  Cannot add ARB due to ACEI induced angioedema, but may consider spironolactone as his GFR has recently increased.  Patient has no history of HF, but could also consider adding isosorbide dinitrate to see if we could get further reduction that way.

## 2019-12-11 NOTE — Progress Notes (Signed)
HPI:  Thomas [name] is a 54 y.o. male patient of Dr Jacques Navy, with a PMH below who presents today for hypertension clinic follow up (see PMH below).  Patient has had problems with compliance, in part due to memory issues brought about by multiple CVA/TIA. He has been seen in the hypertension clinic several times. His daughter has been helping him with medications, staying on track and being compliant.    Today he is in the office with his daughter (CMA in this office) on the phone.  He admits having been out of amlodipine for "some time" - probably at least 3-4 weeks, and the hydralazine at least a week.  He also apparently missed multiple doses of the Repatha, as his LDL increased from 8 to 85.  He could not get refilled due to problems with his Medicaid plan.  Medicaid has him listed at an old address, so when the managed Medicaid system started in September, they had no way to get him his new cards, and he therefore doesn't have active prescription coverage.    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  hyperlipidemia LDL  136  CK3 SCr 2.07  Tobacco disorder Quit 1 months ago  Drug abuse Tested positive for cocaine and THC in May 2020    Blood Pressure Goal:  130/80 (140/90 if unable to tolerate lower goal)  Current Medications:  Amlodipine 10mg  daily carvedilol 12.5 mg twice daily Chlorthalidone 25mg  daily Hydralazine 100mg  twice daily    Family Hx: mother hypertension, now 72; father diabetes, hypertension, died at 62-70; sister died from stroke at 46 (half-sister on dad's side); 4 children - 2 with asthma and epilepsy, 1 with just asthma  Social Hx: no alcohol, coffee daily, some soda Pepsi (sips from someone else's can); quit smoking 1 months ago.  Diet: notes eating more home cooked foods in past few weeks   Exercise:  Increased waking 30 minutes three times weekly.  Has not played any basketball recently  Home BP readings: just got home cuff.  Per daughter  170-190/100+ ; no recent home readings  Intolerances:  Lisinopril - angioedema w/hospitalization  Labs:  02/18/2019: Scr 1.92, Na 142, K 3.4, Cl 101, CHO 65, TG 34, HDL 46, LDL-c 8  Wt Readings from Last 3 Encounters:  12/11/19 218 lb 9.6 oz (99.2 kg)  05/08/19 211 lb 9.6 oz (96 kg)  03/20/19 211 lb 6.4 oz (95.9 kg)   BP Readings from Last 3 Encounters:  12/11/19 (!) 190/122  10/19/19 (!) 193/113  05/08/19 (!) 152/96   Pulse Readings from Last 3 Encounters:  12/11/19 72  10/19/19 (!) 59  05/08/19 (!) 51    Current Outpatient Medications  Medication Sig Dispense Refill  . amLODipine (NORVASC) 10 MG tablet Take 1 tablet (10 mg total) by mouth daily. 90 tablet 3  . aspirin EC 81 MG EC tablet Take 1 tablet (81 mg total) by mouth daily. 30 tablet 1  . atorvastatin (LIPITOR) 80 MG tablet Take 1 tablet (80 mg total) by mouth daily at 6 PM. 90 tablet 3  . carvedilol (COREG) 12.5 MG tablet Take 1 tablet (12.5 mg total) by mouth 2 (two) times daily. 180 tablet 3  . chlorthalidone (HYGROTON) 25 MG tablet Take 1 tablet (25 mg total) by mouth daily. 90 tablet 3  . clopidogrel (PLAVIX) 75 MG tablet Take 1 tablet (75 mg total) by mouth daily. 90 tablet 3  . Evolocumab (REPATHA SURECLICK) 140 MG/ML SOAJ Inject  140 mg into the skin every 14 (fourteen) days. 2 pen 11  . hydrALAZINE (APRESOLINE) 100 MG tablet Take 1 tablet (100 mg total) by mouth 2 (two) times daily. 180 tablet 1   No current facility-administered medications for this visit.    Allergies  Allergen Reactions  . Lisinopril Swelling    Facial swelling    Past Medical History:  Diagnosis Date  . Abnormal EKG   . Bipolar disorder (HCC)   . Cardiomegaly   . CKD (chronic kidney disease), stage II   . Family history of heart disease   . H/O medication noncompliance   . Hyperlipidemia   . Hypertension   . Stroke ([facility]) 11/07/2015  . Tobacco abuse     Blood pressure (!) 190/122, pulse 72, resp. rate 14, height 6\' 3"  (1.905  m), weight 218 lb 9.6 oz (99.2 kg), SpO2 98 %.  Accelerated hypertension Patient with uncontrolled hypertension, due in part to problems with compliance.  It was stressed to him today that if he has problems getting his medications he needs to contact our office immediately, so that we can help find a resolution.  He is to go directly to social services to get his Medicaid information updated and hopefully get a current benefits card today.  If he is unable to get this, he was given a Good Rx card to at least fill the amlodipine and hydralazine today.  We will see him back in 3-4 weeks to re-assess his blood pressure, on medication, and determine next course.  Cannot add ARB due to ACEI induced angioedema, but may consider spironolactone as his GFR has recently increased.  Patient has no history of HF, but could also consider adding isosorbide dinitrate to see if we could get further reduction that way.     PharmD CPP [facility] [address] [place] 12/11/2019 11:53 AM

## 2019-12-11 NOTE — Patient Instructions (Addendum)
Return for a a follow up appointment December 16 at 3 pm   Go to social services to update your address and get updated Medicaid card  Check your blood pressure at home daily and keep record of the readings.  Take your BP meds as follows:  Restart all medications today.  If you have problems getting your medications PLEASE CALL us IMMEDIATELY.  (Nakshatra Klose/Raquel at [phone])  Bring all of your meds, your BP cuff and your record of home blood pressures to your next appointment.  Exercise as you're able, try to walk approximately 30 minutes per day.  Keep salt intake to a minimum, especially watch canned and prepared boxed foods.  Eat more fresh fruits and vegetables and fewer canned items.  Avoid eating in fast food restaurants.    HOW TO TAKE YOUR BLOOD PRESSURE: . Rest 5 minutes before taking your blood pressure. .  Don't smoke or drink caffeinated beverages for at least 30 minutes before. . Take your blood pressure before (not after) you eat. . Sit comfortably with your back supported and both feet on the floor (don't cross your legs). . Elevate your arm to heart level on a table or a desk. . Use the proper sized cuff. It should fit smoothly and snugly around your bare upper arm. There should be enough room to slip a fingertip under the cuff. The bottom edge of the cuff should be 1 inch above the crease of the elbow. . Ideally, take 3 measurements at one sitting and record the average.

## 2019-12-28 ENCOUNTER — Other Ambulatory Visit: Payer: Self-pay

## 2019-12-28 MED ORDER — ATORVASTATIN CALCIUM 80 MG PO TABS: 80.0000 mg | ORAL_TABLET | Freq: Every day | ORAL | 3 refills | Status: DC

## 2019-12-28 MED ORDER — CLOPIDOGREL BISULFATE 75 MG PO TABS
75.0000 mg | ORAL_TABLET | Freq: Every day | ORAL | 3 refills | Status: DC
Start: 2019-12-28 — End: 2021-06-29

## 2020-01-07 ENCOUNTER — Other Ambulatory Visit: Payer: Self-pay

## 2020-01-07 ENCOUNTER — Telehealth: Payer: Self-pay | Admitting: Internal Medicine

## 2020-01-07 ENCOUNTER — Ambulatory Visit (INDEPENDENT_AMBULATORY_CARE_PROVIDER_SITE_OTHER): Payer: Medicaid Other | Admitting: Pharmacist Clinician (PhC)/ Clinical Pharmacy Specialist

## 2020-01-07 VITALS — BP 152/100 | HR 72 | Ht 75.0 in | Wt 217.0 lb

## 2020-01-07 DIAGNOSIS — I10 Essential (primary) hypertension: Secondary | ICD-10-CM

## 2020-01-07 MED ORDER — SPIRONOLACTONE 25 MG PO TABS: 12.5000 mg | ORAL_TABLET | Freq: Every day | ORAL | 3 refills | Status: DC

## 2020-01-07 NOTE — Telephone Encounter (Signed)
Waiting for new insurance card

## 2020-01-07 NOTE — Telephone Encounter (Signed)
Routed to clinical pharmacy team 

## 2020-01-07 NOTE — Progress Notes (Signed)
HPI:  Thomas [name] is a 54 y.o. male patient of Dr Jacques Navy, with a PMH below who presents today for hypertension clinic follow up (see PMH below).  Patient has had problems with compliance, in part due to memory issues brought about by multiple CVA/TIA. He has been seen in the hypertension clinic several times. His daughter has been helping him with medications, staying on track and being compliant.  Despite her help, he still has problems.  At his last visit he was out of several medications, in part because he was having issues with the Medicaid managed care changes.  He had not received updated cards, and was unable to get several meds.  At that time his pressure was 190/122.  Since then he has done better, although was out of chlorthalidone for some time and just restarted about 5 days ago.  He has not yet received his corrected Medicaid cards, but will check with them.   Today he is in the office with his daughter (CMA in this office).  He has not yet received his updated Medicaid paperwork, but has been able to get most of his prescriptions refilled without incident.  Today he reports feeling well, no CP, SOB, LEE or dizziness.    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  hyperlipidemia LDL 85 on atorvastatin 80, Repatha 140 - some compliance issues  CK3 GFR improved from   Tobacco disorder Quit 1 months ago  Drug abuse Tested positive for cocaine and THC in May 2020    Blood Pressure Goal:  130/80 (140/90 if unable to tolerate lower goal)  Current Medications:  Amlodipine 10mg  daily carvedilol 12.5 mg twice daily Chlorthalidone 25mg  daily Hydralazine 100mg  twice daily    Family Hx: mother hypertension, now 33; father diabetes, hypertension, died at 61-70; sister died from stroke at 31 (half-sister on dad's side); 4 children - 2 with asthma and epilepsy, 1 with just asthma  Social Hx: no alcohol, coffee daily, some soda Pepsi (sips from someone else's can); quit smoking 1  months ago.  Diet: notes eating more home cooked foods in past few weeks ; too many snacks recently - candy  Exercise:  Increased waking 30 minutes three times weekly.  Has not played any basketball recently  Home BP readings: no recent home readings  Intolerances:  Lisinopril - angioedema w/hospitalization  Labs:  11/27/19:  Na  02/18/2019: Scr 1.92, Na 142, K 3.4, Cl 101, CHO 65, TG 34, HDL 46, LDL-c 8  Wt Readings from Last 3 Encounters:  01/07/20 217 lb (98.4 kg)  12/11/19 218 lb 9.6 oz (99.2 kg)  05/08/19 211 lb 9.6 oz (96 kg)   BP Readings from Last 3 Encounters:  01/07/20 (!) 152/100  12/11/19 (!) 190/122  10/19/19 (!) 193/113   Pulse Readings from Last 3 Encounters:  01/07/20 72  12/11/19 72  10/19/19 (!) 59    Current Outpatient Medications  Medication Sig Dispense Refill  . amLODipine (NORVASC) 10 MG tablet Take 1 tablet (10 mg total) by mouth daily. 90 tablet 3  . aspirin EC 81 MG EC tablet Take 1 tablet (81 mg total) by mouth daily. 30 tablet 1  . atorvastatin (LIPITOR) 80 MG tablet Take 1 tablet (80 mg total) by mouth daily at 6 PM. 90 tablet 3  . carvedilol (COREG) 12.5 MG tablet Take 1 tablet (12.5 mg total) by mouth 2 (two) times daily. 180 tablet 3  . chlorthalidone (HYGROTON) 25 MG tablet Take  1 tablet (25 mg total) by mouth daily. 90 tablet 3  . clopidogrel (PLAVIX) 75 MG tablet Take 1 tablet (75 mg total) by mouth daily. 90 tablet 3  . Evolocumab (REPATHA SURECLICK) 140 MG/ML SOAJ Inject 140 mg into the skin every 14 (fourteen) days. 2 pen 11  . hydrALAZINE (APRESOLINE) 100 MG tablet Take 1 tablet (100 mg total) by mouth 2 (two) times daily. 180 tablet 1  . spironolactone (ALDACTONE) 25 MG tablet Take 0.5 tablets (12.5 mg total) by mouth daily. 15 tablet 3   No current facility-administered medications for this visit.    Allergies  Allergen Reactions  . Lisinopril Swelling    Facial swelling    Past Medical History:  Diagnosis Date  . Abnormal  EKG   . Bipolar disorder (HCC)   . Cardiomegaly   . CKD (chronic kidney disease), stage II   . Family history of heart disease   . H/O medication noncompliance   . Hyperlipidemia   . Hypertension   . Stroke ([facility]) 11/07/2015  . Tobacco abuse     Blood pressure (!) 152/100, pulse 72, height 6\' 3"  (1.905 m), weight 217 lb (98.4 kg).  HYPERTENSION, BENIGN ESSENTIAL Patient with resistant hypertension, still poorly controlled.  Compliance was better in the past month, but still was short on one medication.  Will need to add to his medications, as he is still far from controlled.  Cannot add ACE/ARB due to angioedema (requiring hospitalization).  Will try spironolactone 12.5 mg daily.  He will need to repeat metabolic panel in 2 weeks, as his GFR is currently at 44 and has been as low as 37 in the past 2 years.  We will see him back in 4 weeks for follow up.     PharmD CPP [facility] [address] [place] 01/08/2020 11:59 AM

## 2020-01-07 NOTE — Patient Instructions (Signed)
Return for a a follow up appointment January 13  Check your blood pressure at home daily and keep record of the readings.  Take your BP meds as follows:  Start spironolactone 12.5 mg (1/2 tablet) once daily in the mornings.  Continue with all other medications  Go to the lab in 2 weeks - at the end of the year (Dec 29-30) to repeat kidney function labs  Bring all of your meds, your BP cuff and your record of home blood pressures to your next appointment.  Exercise as youre able, try to walk approximately 30 minutes per day.  Keep salt intake to a minimum, especially watch canned and prepared boxed foods.  Eat more fresh fruits and vegetables and fewer canned items.  Avoid eating in fast food restaurants.    HOW TO TAKE YOUR BLOOD PRESSURE:  Rest 5 minutes before taking your blood pressure.   Dont smoke or drink caffeinated beverages for at least 30 minutes before.  Take your blood pressure before (not after) you eat.  Sit comfortably with your back supported and both feet on the floor (dont cross your legs).  Elevate your arm to heart level on a table or a desk.  Use the proper sized cuff. It should fit smoothly and snugly around your bare upper arm. There should be enough room to slip a fingertip under the cuff. The bottom edge of the cuff should be 1 inch above the crease of the elbow.  Ideally, take 3 measurements at one sitting and record the average.

## 2020-01-07 NOTE — Telephone Encounter (Signed)
    Pt c/o medication issue:  1. Name of Medication:   Evolocumab (REPATHA SURECLICK) 140 MG/ML SOAJ    2. How are you currently taking this medication (dosage and times per day)? Inject 140 mg into the skin every 14 (fourteen) days.  3. Are you having a reaction (difficulty breathing--STAT)?   4. What is your medication issue? Danielle with [facility] calling to follow up prior auth request for pt's repatha. She said they faxed it on 12/13 and 12/14

## 2020-01-08 ENCOUNTER — Telehealth: Payer: Self-pay | Admitting: Internal Medicine

## 2020-01-08 NOTE — Assessment & Plan Note (Signed)
Patient with resistant hypertension, still poorly controlled.  Compliance was better in the past month, but still was short on one medication.  Will need to add to his medications, as he is still far from controlled.  Cannot add ACE/ARB due to angioedema (requiring hospitalization).  Will try spironolactone 12.5 mg daily.  He will need to repeat metabolic panel in 2 weeks, as his GFR is currently at 44 and has been as low as 37 in the past 2 years.  We will see him back in 4 weeks for follow up.

## 2020-01-08 NOTE — Telephone Encounter (Signed)
Routed to [facility] clinical pharmacy team

## 2020-01-08 NOTE — Telephone Encounter (Signed)
[facility] called because the patient needs Prior Authorization for the Repatha. Please call [facility]

## 2020-01-11 NOTE — Telephone Encounter (Signed)
Pa submitted: Thomas [name] (Key: [id number]) [id number] Repatha SureClick 140MG /ML auto-injectors

## 2020-01-13 NOTE — Telephone Encounter (Signed)
Called and completed pa by phone

## 2020-01-13 NOTE — Telephone Encounter (Signed)
Pa # to check status [id number] call [phone]

## 2020-01-13 NOTE — Telephone Encounter (Signed)
Amy from [facility] states the prior authorization was denied. They would like to know if office will be sending an appeal. [phone]

## 2020-01-18 ENCOUNTER — Telehealth: Payer: Self-pay | Admitting: Internal Medicine

## 2020-01-18 NOTE — Telephone Encounter (Signed)
New message:    The pharmacy calling to ask about the patient.

## 2020-01-18 NOTE — Telephone Encounter (Signed)
Called and spoke w/pharmacy tech regarding the prior auth for repatha and I stated that it was sent in just awaiting determination

## 2020-02-04 ENCOUNTER — Ambulatory Visit: Payer: Medicaid Other

## 2020-03-14 ENCOUNTER — Other Ambulatory Visit: Payer: Self-pay | Admitting: Internal Medicine

## 2020-03-31 ENCOUNTER — Other Ambulatory Visit: Payer: Self-pay

## 2020-03-31 MED ORDER — REPATHA SURECLICK 140 MG/ML ~~LOC~~ SOAJ: 140.0000 mg | SUBCUTANEOUS | 3 refills | Status: DC

## 2020-06-18 ENCOUNTER — Other Ambulatory Visit: Payer: Self-pay | Admitting: Internal Medicine

## 2021-02-02 ENCOUNTER — Telehealth: Payer: Self-pay

## 2021-02-02 DIAGNOSIS — E785 Hyperlipidemia, unspecified: Secondary | ICD-10-CM

## 2021-02-02 NOTE — Telephone Encounter (Signed)
Called and lmom pt that we need them to do fasting labs asap so that I may complete a pa for their repatha.

## 2021-02-10 ENCOUNTER — Ambulatory Visit (HOSPITAL_COMMUNITY)
Admission: EM | Admit: 2021-02-10 | Discharge: 2021-02-10 | Disposition: A | Discharge status: Home / Self Care | Payer: Medicaid Other | Attending: Internal Medicine | Admitting: Internal Medicine

## 2021-02-10 ENCOUNTER — Ambulatory Visit (INDEPENDENT_AMBULATORY_CARE_PROVIDER_SITE_OTHER): Payer: Medicaid Other

## 2021-02-10 ENCOUNTER — Other Ambulatory Visit: Payer: Self-pay

## 2021-02-10 ENCOUNTER — Encounter (HOSPITAL_COMMUNITY): Payer: Self-pay | Admitting: Emergency Medicine

## 2021-02-10 DIAGNOSIS — M79642 Pain in left hand: Secondary | ICD-10-CM | POA: Diagnosis not present

## 2021-02-10 DIAGNOSIS — S60222A Contusion of left hand, initial encounter: Secondary | ICD-10-CM | POA: Diagnosis not present

## 2021-02-10 NOTE — Discharge Instructions (Addendum)
Your x-ray today does not show any evidence of broken bone.  Rest, ice frequently and elevate.  Take ibuprofen as needed for pain.  Follow-up for any persistent or worsening symptoms.

## 2021-02-10 NOTE — ED Provider Notes (Signed)
[place]    CSN: [id number] Arrival date & time: 02/10/21  1735      History   Chief Complaint Chief Complaint  Patient presents with   Wrist Injury    HPI Thomas [name] is a 56 y.o. male presents to urgent care today with left hand pain.  Patient reports door following open and hitting left hand.  Patient reports immediate pain unrelieved with soaking and [place].  Patient denies any numbness or tingling.    Past Medical History:  Diagnosis Date   Abnormal EKG    Bipolar disorder (HCC)    Cardiomegaly    CKD (chronic kidney disease), stage II    Family history of heart disease    H/O medication noncompliance    Hyperlipidemia    Hypertension    Stroke ([place]) 11/07/2015   Tobacco abuse     Patient Active Problem List   Diagnosis Date Noted   Cerebral thrombosis with cerebral infarction 06/07/2018   Accelerated hypertension 06/06/2018   Stroke (cerebrum) ([place]) 08/29/2017   Stroke ([place]) 08/29/2017   CKD (chronic kidney disease), stage II    Bipolar disorder ([place])    Altered mental status 02/24/2016   Urinary tract infection without hematuria 02/24/2016   Acute encephalopathy 02/24/2016   Chest pain 02/24/2016   History of stroke    Blurry vision, right eye 11/08/2015   Acute cerebrovascular accident (CVA) ([place]) 11/08/2015   Acute kidney injury superimposed on CKD ([place]) 11/07/2015   Hyperlipidemia 01/26/2014   TOBACCO ABUSE 04/19/2008   HYPERTENSION, BENIGN ESSENTIAL 01/22/2005    Past Surgical History:  Procedure Laterality Date   ANKLE FRACTURE SURGERY     NM MYOCAR PERF WALL MOTION  06/28/2011   protocol Bruce, normal perfusion nin all regions, post stress EF 57%,, exercise cap 13METS   TRANSTHORACIC ECHOCARDIOGRAM  06/28/2011   EF=55%, Proximal septal thickening, borderline LA enlargement, boarderline aortic root dialation       Home Medications    Prior to Admission medications   Medication Sig Start Date End Date Taking?  Authorizing Provider  amLODipine (NORVASC) 10 MG tablet Take 1 tablet (10 mg total) by mouth daily. 12/11/19   Elouise Munroe, MD  aspirin EC 81 MG EC tablet Take 1 tablet (81 mg total) by mouth daily. 06/08/18   Caren Griffins, MD  atorvastatin (LIPITOR) 80 MG tablet Take 1 tablet (80 mg total) by mouth daily at 6 PM. 12/28/19   Elouise Munroe, MD  carvedilol (COREG) 12.5 MG tablet Take 1 tablet (12.5 mg total) by mouth 2 (two) times daily. 12/11/19 03/10/20  Elouise Munroe, MD  chlorthalidone (HYGROTON) 25 MG tablet Take 1 tablet (25 mg total) by mouth daily. 12/11/19   Elouise Munroe, MD  clopidogrel (PLAVIX) 75 MG tablet Take 1 tablet (75 mg total) by mouth daily. 12/28/19   Elouise Munroe, MD  Evolocumab (REPATHA SURECLICK) XX123456 MG/ML SOAJ Inject 140 mg into the skin every 14 (fourteen) days. 03/31/20   Elouise Munroe, MD  hydrALAZINE (APRESOLINE) 100 MG tablet Take 1 tablet (100 mg total) by mouth 2 (two) times daily. 12/11/19   Elouise Munroe, MD  spironolactone (ALDACTONE) 25 MG tablet Take 0.5 tablets (12.5 mg total) by mouth daily. 01/07/20 04/06/20  Rockne Menghini, RPH-CPP    Family History Family History  Problem Relation Age of Onset   Hypertension Mother    Diabetes Mother    Stroke Mother    Hypertension Father  Diabetes Father    Stroke Father    Hypertension Sister    Stroke Sister    Diabetes Sister    Hyperlipidemia Maternal Grandmother    Diabetes Maternal Grandmother    Asthma Daughter    Asthma Son     Social History Social History   Tobacco Use   Smoking status: Former    Packs/day: 0.25    Years: 15.00    Pack years: 3.75    Types: Cigarettes    Quit date: 11/06/2015    Years since quitting: 5.2   Smokeless tobacco: Never   Tobacco comments:    quit in Juy 2015  Substance Use Topics   Alcohol use: Not Currently    Alcohol/week: 12.0 standard drinks    Types: 12 Cans of beer per week    Comment: 12 pack on weekends     Drug use: Yes    Types: Marijuana     Allergies   Lisinopril   Review of Systems As stated in HPI otherwise negative   Physical Exam Triage Vital Signs ED Triage Vitals [02/10/21 1755]  Enc Vitals Group     BP (!) 167/112     Pulse Rate 61     Resp 18     Temp 98.1 F (36.7 C)     Temp Source Oral     SpO2 99 %     Weight      Height      Head Circumference      Peak Flow      Pain Score 8     Pain Loc      Pain Edu?      Excl. in [place]?    No data found.  Updated Vital Signs BP (!) 167/112 (BP Location: Right Arm)    Pulse 61    Temp 98.1 F (36.7 C) (Oral)    Resp 18    SpO2 99%   Visual Acuity Right Eye Distance:   Left Eye Distance:   Bilateral Distance:    Right Eye Near:   Left Eye Near:    Bilateral Near:     Physical Exam Constitutional:      General: He is not in acute distress.    Appearance: Normal appearance. He is not ill-appearing or toxic-appearing.  Musculoskeletal:        General: Tenderness present. No swelling or deformity.     Comments: TTP of left hand at base of thumb extending to scaphoid region.  No obvious deformity, no swelling or ecchymosis.  Sensation intact, good cap refill  Skin:    General: Skin is warm and dry.     Findings: No bruising, erythema or lesion.  Neurological:     Mental Status: He is alert.     Sensory: No sensory deficit.     Motor: No weakness.  Psychiatric:        Mood and Affect: Mood normal.        Behavior: Behavior normal.     UC Treatments / Results  Labs (all labs ordered are listed, but only abnormal results are displayed) Labs Reviewed - No data to display  EKG   Radiology DG Hand Complete Left  Result Date: 02/10/2021 CLINICAL DATA:  Left hand pain.  Trauma. EXAM: LEFT HAND - COMPLETE 3+ VIEW COMPARISON:  None. FINDINGS: There is no evidence of fracture or dislocation. There is no evidence of arthropathy or other focal bone abnormality. Soft tissues are unremarkable. IMPRESSION:  Negative. Electronically Signed  By: Rolm Baptise M.D.   On: 02/10/2021 18:23    Procedures Procedures (including critical care time)  Medications Ordered in UC Medications - No data to display  Initial Impression / Assessment and Plan / UC Course  I have reviewed the triage vital signs and the nursing notes.  Pertinent labs & imaging results that were available during my care of the patient were reviewed by me and considered in my medical decision making (see chart for details).  Contusion, left hand -X-ray negative for fracture -Rest, ice, elevation -Tylenol and/or Motrin as needed for pain -Follow-up for persistent or worsening symptoms  Reviewed expections re: course of current medical issues. Questions answered. Outlined signs and symptoms indicating need for more acute intervention. Pt verbalized understanding. AVS given  Final Clinical Impressions(s) / UC Diagnoses   Final diagnoses:  Contusion of left hand, initial encounter     Discharge Instructions      Your x-ray today does not show any evidence of broken bone.  Rest, ice frequently and elevate.  Take ibuprofen as needed for pain.  Follow-up for any persistent or worsening symptoms.     ED Prescriptions   None    PDMP not reviewed this encounter.   Rudolpho Sevin, NP 02/10/21 2103

## 2021-02-10 NOTE — ED Triage Notes (Signed)
Pt reports helping a friend working today and when pallet bounced back hurt left wrist.

## 2021-02-15 ENCOUNTER — Telehealth: Payer: Self-pay | Admitting: Internal Medicine

## 2021-02-15 NOTE — Telephone Encounter (Signed)
Tried calling the pt twice to get them to do lab work but they kept hanging up the phone on me. Labs are needed to do a pa for repatha

## 2021-02-15 NOTE — Telephone Encounter (Signed)
Pt c/o medication issue:  1. Name of Medication: Evolocumab (REPATHA SURECLICK) [id number] MG/ML SOAJ  2. How are you currently taking this medication (dosage and times per day)? Inject 140 mg into the skin every 14 (fourteen) days.  3. Are you having a reaction (difficulty breathing--STAT)? no  4. What is your medication issue? Calling in because they need a new authorization for the medication

## 2021-02-17 NOTE — Telephone Encounter (Signed)
Pharmacy called to check on the status of PA. Advised of the documentation left by CMA. Pharmacy verbalized understanding and advised they would notate this.

## 2021-06-28 NOTE — Progress Notes (Signed)
Office Visit    Patient Name: Thomas [name] Date of Encounter: 06/28/2021  Primary Care Provider:  Nolene Ebbs, MD Primary Cardiologist:  Elouise Munroe, MD Primary Electrophysiologist: None  Chief Complaint    Thomas [name] is a 56 y.o. male with PMH of resistant HTN, CKD stage II, HLD, CVA 2017, bipolar disorder, tobacco and marijuana use who presents today for overdue follow-up of hypertension.  Past Medical History    Past Medical History:  Diagnosis Date   Abnormal EKG    Bipolar disorder ([place])    Cardiomegaly    CKD (chronic kidney disease), stage II    Family history of heart disease    H/O medication noncompliance    Hyperlipidemia    Hypertension    Stroke ([place]) 11/07/2015   Tobacco abuse    Past Surgical History:  Procedure Laterality Date   ANKLE FRACTURE SURGERY     NM MYOCAR PERF WALL MOTION  06/28/2011   protocol Bruce, normal perfusion nin all regions, post stress EF 57%,, exercise cap 13METS   TRANSTHORACIC ECHOCARDIOGRAM  06/28/2011   EF=55%, Proximal septal thickening, borderline LA enlargement, boarderline aortic root dialation    Allergies  Allergies  Allergen Reactions   Lisinopril Swelling    Facial swelling    History of Present Illness    Thomas [name] is a 56 year old male with the above-mentioned past medical history who presents today for overdue follow-up for his hypertension.  Patient has history of CVA without residual deficit.  He was [facility] to [facility] on [facility]2018 after presenting with blurry vision in his right eye.  His systolic blood pressure in the ED was [id number] and diastolic at [id number].  He was started on hydralazine and EKG revealed sinus bradycardia with T WI in inferior leads.  CT of the head without contrast revealed small infarct in the inferior and mid right centrum semiovale.  He was consulted by neurology who recommended head MRI that revealed an 8 mm acute ischemic nonhemorrhagic right infarct.  There was no  significant arterial occlusion or high-grade stenosis. Carotid ultrasounds revealed no significant plaque buildup. 2D echo completed with EF of 55-60% and LVH.  He was started on DAPT with Plavix and aspirin and was discharged on the following day.  [name] was last seen by Dr. Margaretann Loveless in 2021 for treatment of resistant hypertension.  During encounter patient blood pressure was better controlled at 152/96. He has struggled with memory issues since his CVA/TIA.  He was last seen by hypertension clinic and December 2021.  During visit he was out of several medications due to issues with medication management.  He was lost to follow-up until 06/2021.  Mr. [name] presents today with his daughter in clinic for his follow-up.  He was last seen by hypertension clinic in 12/2019.  Since that time his daughter states that he is struggled with compliance due to residual memory deficits from previous strokes. He is currently only taking his hydralazine 100 mg but is taking sporadically.  He endorses some dizziness with taking his hydralazine due to inconsistent compliance. He is currently walking daily and is eager to get back to exercising. He does admit to some dietary discretions with sodium and was informed to moderate salt intake and try Mediterranean diet.  He does endorse some vision changes however these are related to needing new prescription for his glasses.  Patient denies chest pain, palpitations, dyspnea, PND, orthopnea, nausea, vomiting, dizziness, syncope, edema, weight gain, or  early satiety.  Home Medications    Current Outpatient Medications  Medication Sig Dispense Refill   amLODipine (NORVASC) 10 MG tablet Take 1 tablet (10 mg total) by mouth daily. 90 tablet 3   aspirin EC 81 MG EC tablet Take 1 tablet (81 mg total) by mouth daily. 30 tablet 1   atorvastatin (LIPITOR) 80 MG tablet Take 1 tablet (80 mg total) by mouth daily at 6 PM. 90 tablet 3   carvedilol (COREG) 12.5 MG tablet Take 1  tablet (12.5 mg total) by mouth 2 (two) times daily. 180 tablet 3   chlorthalidone (HYGROTON) 25 MG tablet Take 1 tablet (25 mg total) by mouth daily. 90 tablet 3   clopidogrel (PLAVIX) 75 MG tablet Take 1 tablet (75 mg total) by mouth daily. 90 tablet 3   Evolocumab (REPATHA SURECLICK) [id number] MG/ML SOAJ Inject 140 mg into the skin every 14 (fourteen) days. 6 mL 3   hydrALAZINE (APRESOLINE) 100 MG tablet Take 1 tablet (100 mg total) by mouth 2 (two) times daily. 180 tablet 1   spironolactone (ALDACTONE) 25 MG tablet Take 0.5 tablets (12.5 mg total) by mouth daily. 15 tablet 3   No current facility-administered medications for this visit.     Review of Systems  Please see the history of present illness.    (+) Dizziness  All other systems reviewed and are otherwise negative except as noted above.  Physical Exam    Wt Readings from Last 3 Encounters:  01/07/20 217 lb (98.4 kg)  12/11/19 218 lb 9.6 oz (99.2 kg)  05/08/19 211 lb 9.6 oz (96 kg)   [id number] were no vitals filed for this visit.,There is no height or weight on file to calculate BMI.  Constitutional:      Appearance: Healthy appearance. Not in distress.  Neck:     Vascular: JVD normal.  Pulmonary:     Effort: Pulmonary effort is normal.     Breath sounds: No wheezing. No rales. Diminished in the bases Cardiovascular:     Normal rate. Regular rhythm. Normal S1. Normal S2.      Murmurs: There is no murmur.  Edema:    Peripheral edema absent.  Abdominal:     Palpations: Abdomen is soft non tender. There is no hepatomegaly.  Skin:    General: Skin is warm and dry.  Neurological:     General: No focal deficit present.     Mental Status: Alert and oriented to person, place and time.     Cranial Nerves: Cranial nerves are intact.  EKG/LABS/Other Studies Reviewed    ECG personally reviewed by me today -normal sinus rhythm with possible left atrial enlargement and left anterior fascicular block nonspecific TWI in leads V4  V5.  Lab Results  Component Value Date   WBC 3.7 11/27/2019   HGB 16.1 11/27/2019   HCT 46.9 11/27/2019   MCV 83 11/27/2019   PLT 197 11/27/2019   Lab Results  Component Value Date   CREATININE 1.73 (H) 11/27/2019   BUN 16 11/27/2019   NA 141 11/27/2019   K 3.8 11/27/2019   CL 103 11/27/2019   CO2 24 11/27/2019   Lab Results  Component Value Date   ALT 16 02/18/2019   AST 18 02/18/2019   ALKPHOS 76 02/18/2019   BILITOT 0.7 02/18/2019   Lab Results  Component Value Date   CHOL 144 11/27/2019   HDL 47 11/27/2019   LDLCALC 85 11/27/2019   TRIG 58 11/27/2019   CHOLHDL 4.9  06/07/2018    Lab Results  Component Value Date   HGBA1C 5.1 06/07/2018    Assessment & Plan    1.  Resistant hypertension: -Patient's blood pressure today was 180/110.  He admits to some dietary indiscretions and his daughter states that he is not taking his medications as prescribed. -We will try blister packs for his blood pressure medications to help with compliance due to his memory deficit. -Due to compliance we will restart only carvedilol 12.5 mg twice daily, chlorthalidone 25 mg daily and hydralazine 100 mg twice daily. -BMET and CBC today -We will schedule follow-up with hypertension clinic in 2 weeks with plan to titrate medication for better blood pressure control.  2. History of CVA: -s/p CVA in 2015 with residual memory deficits. -Continue Plavix 75 mg daily and 81 mg ASA daily   3.  Hyperlipidemia: -Patient has follow-up and lipid clinic following lipids and LFTs -Last LDL cholesterol was 81 and 2021 above goal of less than 70 -Continue Repatha 1 to 40 mg every 14 days.  4.  Chronic kidney disease stage II: -Patient is currently followed by [place] [facility] -Renal function will be evaluated as noted above  5. Medication Compliance: -Compliance issues due to memory deficits following CVA in 2015 -We will obtain blister packs for medications patient's daughter will remind  patient to take medications daily  6.  Mild aortic regurgitation: -2D echo and 2020 showed aortic regurgitation and left ventricular hypertrophy with EF of 55-60% -We will repeat 2D echo to evaluate heart structure and valves  Disposition: Follow-up with Elouise Munroe, MD or APP in 2 weeks     Medication Adjustments/Labs and Tests Ordered: Current medicines are reviewed at length with the patient today.  Concerns regarding medicines are outlined above.   Signed, Mable Fill, Marissa Nestle, NP 06/28/2021, 5:09 PM [place]

## 2021-06-29 ENCOUNTER — Other Ambulatory Visit: Payer: Self-pay

## 2021-06-29 ENCOUNTER — Ambulatory Visit (INDEPENDENT_AMBULATORY_CARE_PROVIDER_SITE_OTHER): Payer: Medicaid Other | Admitting: Nurse Practitioner

## 2021-06-29 ENCOUNTER — Encounter: Payer: Self-pay | Admitting: Physician Assistant

## 2021-06-29 VITALS — BP 180/110 | HR 71 | Ht 75.0 in | Wt 206.6 lb

## 2021-06-29 DIAGNOSIS — N182 Chronic kidney disease, stage 2 (mild): Secondary | ICD-10-CM

## 2021-06-29 DIAGNOSIS — I10 Essential (primary) hypertension: Secondary | ICD-10-CM | POA: Diagnosis not present

## 2021-06-29 DIAGNOSIS — Z79899 Other long term (current) drug therapy: Secondary | ICD-10-CM

## 2021-06-29 DIAGNOSIS — I639 Cerebral infarction, unspecified: Secondary | ICD-10-CM

## 2021-06-29 DIAGNOSIS — E785 Hyperlipidemia, unspecified: Secondary | ICD-10-CM

## 2021-06-29 DIAGNOSIS — I351 Nonrheumatic aortic (valve) insufficiency: Secondary | ICD-10-CM | POA: Diagnosis not present

## 2021-06-29 MED ORDER — CHLORTHALIDONE 25 MG PO TABS: 25.0000 mg | ORAL_TABLET | Freq: Every day | ORAL | 3 refills | Status: DC

## 2021-06-29 MED ORDER — CLOPIDOGREL BISULFATE 75 MG PO TABS: 75.0000 mg | ORAL_TABLET | Freq: Every day | ORAL | 3 refills | Status: DC

## 2021-06-29 MED ORDER — CARVEDILOL 12.5 MG PO TABS: 12.5000 mg | ORAL_TABLET | Freq: Two times a day (BID) | ORAL | 3 refills | Status: DC

## 2021-06-29 MED ORDER — HYDRALAZINE HCL 100 MG PO TABS: 100.0000 mg | ORAL_TABLET | Freq: Two times a day (BID) | ORAL | 3 refills | Status: DC

## 2021-06-29 NOTE — Patient Instructions (Addendum)
Medication Instructions:  RESTART Carvedilol 12.5 mg 2 times a day  RESTART Hydralazine 100 mg 2 times a day  RESTART Chlorthalidone 25 mg daily HOLD Amlodipine  HOLD Spirolactone  *If you need a refill on your cardiac medications before your next appointment, please call your pharmacy*  Lab Work: Your physician recommends that you return for lab work Tomorrow:  BMET CBC FLP If you have labs (blood work) drawn today and your tests are completely normal, you will receive your results only by: [employer] (if you have MyChart) OR A paper copy in the mail If you have any lab test that is abnormal or we need to change your treatment, we will call you to review the results.  Testing/Procedures: Your physician has requested that you have an echocardiogram. Echocardiography is a painless test that uses sound waves to create images of your heart. It provides your doctor with information about the size and shape of your heart and how well your heart's chambers and valves are working. This procedure takes approximately one hour. There are no restrictions for this procedure.  Follow-Up: At [facility], you and your health needs are our priority.  As part of our continuing mission to provide you with exceptional heart care, we have created designated Provider Care Teams.  These Care Teams include your primary Cardiologist (physician) and Advanced Practice Providers (APPs -  Physician Assistants and Nurse Practitioners) who all work together to provide you with the care you need, when you need it.    Your next appointment:   2 week(s) 1 month(s)  The format for your next appointment:   In Person In Person   Provider:   Pharm D    Azalee Course, PA-C   Other Instructions   Important Information About Sugar

## 2021-06-30 LAB — BASIC METABOLIC PANEL
BUN/Creatinine Ratio: 9 (ref 9–20)
BUN: 20 mg/dL (ref 6–24)
CO2: 25 mmol/L (ref 20–29)
Calcium: 10 mg/dL (ref 8.7–10.2)
Chloride: 101 mmol/L (ref 96–106)
Creatinine, Ser: 2.16 mg/dL — ABNORMAL HIGH (ref 0.76–1.27)
Glucose: 85 mg/dL (ref 70–99)
Potassium: 3.7 mmol/L (ref 3.5–5.2)
Sodium: 139 mmol/L (ref 134–144)
eGFR: 35 mL/min/{1.73_m2} — ABNORMAL LOW (ref >59)

## 2021-06-30 LAB — CBC
Hematocrit: 48.2 % (ref 37.5–51.0)
Hemoglobin: 16.8 g/dL (ref 13.0–17.7)
MCH: 28.7 pg (ref 26.6–33.0)
MCHC: 34.9 g/dL (ref 31.5–35.7)
MCV: 82 fL (ref 79–97)
Platelets: 202 10*3/uL (ref 150–450)
RBC: 5.86 x10E6/uL — ABNORMAL HIGH (ref 4.14–5.80)
RDW: 14.5 % (ref 11.6–15.4)
WBC: 4.4 10*3/uL (ref 3.4–10.8)

## 2021-06-30 LAB — LIPID PANEL
Chol/HDL Ratio: 4.6 ratio (ref 0.0–5.0)
Cholesterol, Total: 214 mg/dL — ABNORMAL HIGH (ref 100–199)
HDL: 47 mg/dL (ref >39)
LDL Chol Calc (NIH): 158 mg/dL — ABNORMAL HIGH (ref 0–99)
Triglycerides: 53 mg/dL (ref 0–149)
VLDL Cholesterol Cal: 9 mg/dL (ref 5–40)

## 2021-07-12 ENCOUNTER — Ambulatory Visit (HOSPITAL_COMMUNITY): Payer: Medicaid Other | Attending: Cardiology

## 2021-07-12 DIAGNOSIS — I351 Nonrheumatic aortic (valve) insufficiency: Secondary | ICD-10-CM | POA: Diagnosis not present

## 2021-07-13 ENCOUNTER — Ambulatory Visit (INDEPENDENT_AMBULATORY_CARE_PROVIDER_SITE_OTHER): Payer: Medicaid Other | Admitting: Pharmacist Clinician (PhC)/ Clinical Pharmacy Specialist

## 2021-07-13 DIAGNOSIS — I10 Essential (primary) hypertension: Secondary | ICD-10-CM | POA: Diagnosis not present

## 2021-07-13 DIAGNOSIS — E7849 Other hyperlipidemia: Secondary | ICD-10-CM

## 2021-07-13 LAB — ECHOCARDIOGRAM COMPLETE
Area-P 1/2: 2.46 cm2
P 1/2 time: 471 msec
S' Lateral: 3.3 cm

## 2021-07-13 NOTE — Patient Instructions (Signed)
Return for a a follow up appointment Monday July 17 at 11 am  Go to the lab in 2 weeks to check kidney function   Check your blood pressure at home daily and keep record of the readings.  Take your BP meds as follows:  Start with the bubble pack medication tomorrow morning. (Have Tiwan send a My Chart message to let me know what is in the bubble packs)  In addition to that take amlodipine 5 mg each evening.    We will start the process to get Repatha covered by insurance again.  Bring all of your meds, your BP cuff and your record of home blood pressures to your next appointment.  Exercise as you're able, try to walk approximately 30 minutes per day.  Keep salt intake to a minimum, especially watch canned and prepared boxed foods.  Eat more fresh fruits and vegetables and fewer canned items.  Avoid eating in fast food restaurants.    HOW TO TAKE YOUR BLOOD PRESSURE: Rest 5 minutes before taking your blood pressure.  Don't smoke or drink caffeinated beverages for at least 30 minutes before. Take your blood pressure before (not after) you eat. Sit comfortably with your back supported and both feet on the floor (don't cross your legs). Elevate your arm to heart level on a table or a desk. Use the proper sized cuff. It should fit smoothly and snugly around your bare upper arm. There should be enough room to slip a fingertip under the cuff. The bottom edge of the cuff should be 1 inch above the crease of the elbow. Ideally, take 3 measurements at one sitting and record the average.

## 2021-07-13 NOTE — Progress Notes (Unsigned)
HPI:  Thomas [name] is a 56 y.o. male patient of Dr Jacques Navy, with a PMH below who presents today for hypertension clinic follow up (see PMH below).  Patient has had problems with compliance, in part due to memory issues brought about by multiple CVA/TIA. He has been seen in the hypertension clinic several times. His daughter has been helping him with medications, staying on track and being compliant.  Despite her help, he still has problems with compliance.   I last saw him in December 2021 and he was not seen again in [facility] until earlier this month when he was seen by Robin Searing PA for resistant hypertension.  BP at that appointment was 180/110.  His daughter noted continued struggles with compliance and proper dietary restrictions.  Arrangements were made to get prescriptions in blister packs to help with memory issues and he was started with carvedilol 12.5 bid, chlorthalidone 25 mg qd, hydralazine 100 mg bid  4 in am 2 in pm -  filled at [facility] pharmacy   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  hyperlipidemia LDL 85 on atorvastatin 80, Repatha 140 - some compliance issues  CK3 GFR improved from   Tobacco disorder Quit 1 months ago - still quit  Drug abuse Tested positive for cocaine and THC in May 2020    Blood Pressure Goal:  130/80 (140/90 if unable to tolerate lower goal)  Current Medications:  Amlodipine 10mg  daily carvedilol 12.5 mg twice daily Chlorthalidone 25mg  daily Hydralazine 100mg  twice daily    Family Hx: mother hypertension, now 31; father diabetes, hypertension, died at 46-70; sister died from stroke at 26 (half-sister on dad's side); 4 children - 2 with asthma and epilepsy, 1 with just asthma  Social Hx: no alcohol, coffee daily, some soda Pepsi (sips from someone else's can); quit smoking 1 months ago.  Diet: notes eating more home cooked foods in past few weeks ; too many snacks recently - candy  Omelette this morning with fruit and tomatoes;  73 sausage (likes frosted flakes)  No lunch most days  Baked chicken green beans, mashed potatoes  Snacking - ice cream, candy  Exercise:  plays congos at church, walks around block  Home BP readings: no recent home readings  2 times over 200/ diastolic 100-130/ syst 180-200  Intolerances:  Lisinopril - angioedema w/hospitalization  Labs:  11/27/19:  Na  02/18/2019: Scr 1.92, Na 142, K 3.4, Cl 101, CHO 65, TG 34, HDL 46, LDL-c 8  Wt Readings from Last 3 Encounters:  06/29/21 206 lb 9.6 oz (93.7 kg)  01/07/20 217 lb (98.4 kg)  12/11/19 218 lb 9.6 oz (99.2 kg)   BP Readings from Last 3 Encounters:  06/29/21 (!) 180/110  02/10/21 (!) 167/112  01/07/20 (!) 152/100   Pulse Readings from Last 3 Encounters:  06/29/21 71  02/10/21 61  01/07/20 72    Current Outpatient Medications  Medication Sig Dispense Refill   amLODipine (NORVASC) 10 MG tablet Take 1 tablet (10 mg total) by mouth daily. 90 tablet 3   aspirin EC 81 MG EC tablet Take 1 tablet (81 mg total) by mouth daily. 30 tablet 1   atorvastatin (LIPITOR) 80 MG tablet Take 1 tablet (80 mg total) by mouth daily at 6 PM. 90 tablet 3   carvedilol (COREG) 12.5 MG tablet Take 1 tablet (12.5 mg total) by mouth 2 (two) times daily. 180 tablet 3   chlorthalidone (HYGROTON) 25 MG tablet Take 1 tablet (  25 mg total) by mouth daily. 90 tablet 3   clopidogrel (PLAVIX) 75 MG tablet Take 1 tablet (75 mg total) by mouth daily. 90 tablet 3   Evolocumab (REPATHA SURECLICK) 140 MG/ML SOAJ Inject 140 mg into the skin every 14 (fourteen) days. 6 mL 3   hydrALAZINE (APRESOLINE) 100 MG tablet Take 1 tablet (100 mg total) by mouth 2 (two) times daily. 180 tablet 3   spironolactone (ALDACTONE) 25 MG tablet Take 0.5 tablets (12.5 mg total) by mouth daily. 15 tablet 3   No current facility-administered medications for this visit.    Allergies  Allergen Reactions   Lisinopril Swelling    Facial swelling    Past Medical History:  Diagnosis  Date   Abnormal EKG    Bipolar disorder (HCC)    Cardiomegaly    CKD (chronic kidney disease), stage II    Family history of heart disease    H/O medication noncompliance    Hyperlipidemia    Hypertension    Stroke (HCC) 11/07/2015   Tobacco abuse     There were no vitals taken for this visit.  No problem-specific Assessment & Plan notes found for this encounter.   Phillips Hay PharmD CPP [facility] [address] [place] [id number] 07/13/2021 12:56 PM

## 2021-07-14 ENCOUNTER — Encounter: Payer: Self-pay | Admitting: Pharmacist Clinician (PhC)/ Clinical Pharmacy Specialist

## 2021-07-14 MED ORDER — AMLODIPINE BESYLATE 5 MG PO TABS: 5.0000 mg | ORAL_TABLET | Freq: Every day | ORAL | 12 refills | Status: DC

## 2021-07-14 NOTE — Assessment & Plan Note (Signed)
Patient with hard to treat hypertension, in part due to difficulties with memory and compliance.  He has had multiple strokes over the past 5-10 years.  Blood pressure still elevated in office today and he has not started the unit dose med cards received from [facility].  Advised that he should start those tomorrow morning.  Daughter in charge of medication dispensing (fomer CMA in this office, she is well versed in getting his meds to him correctly).  Will add amlodipine 5 mg back into his regimen.  Will also work with family and pharmacy to get all of his medications transferred there and into the unit dose cards.  I will see him back in 3 weeks for follow up and asked that he bring home BP cuff and all medications to visit.

## 2021-07-18 ENCOUNTER — Telehealth: Payer: Self-pay | Admitting: Pharmacist Clinician (PhC)/ Clinical Pharmacy Specialist

## 2021-07-27 ENCOUNTER — Ambulatory Visit (INDEPENDENT_AMBULATORY_CARE_PROVIDER_SITE_OTHER): Payer: Medicaid Other | Admitting: Physician Assistant

## 2021-07-27 ENCOUNTER — Encounter: Payer: Self-pay | Admitting: Physician Assistant

## 2021-07-27 VITALS — BP 150/102 | HR 57 | Ht 75.0 in | Wt 200.0 lb

## 2021-07-27 DIAGNOSIS — N179 Acute kidney failure, unspecified: Secondary | ICD-10-CM

## 2021-07-27 DIAGNOSIS — E785 Hyperlipidemia, unspecified: Secondary | ICD-10-CM

## 2021-07-27 DIAGNOSIS — N1832 Chronic kidney disease, stage 3b: Secondary | ICD-10-CM | POA: Diagnosis not present

## 2021-07-27 DIAGNOSIS — Z8673 Personal history of transient ischemic attack (TIA), and cerebral infarction without residual deficits: Secondary | ICD-10-CM

## 2021-07-27 DIAGNOSIS — I7121 Aneurysm of the ascending aorta, without rupture: Secondary | ICD-10-CM

## 2021-07-27 DIAGNOSIS — N182 Chronic kidney disease, stage 2 (mild): Secondary | ICD-10-CM

## 2021-07-27 DIAGNOSIS — I10 Essential (primary) hypertension: Secondary | ICD-10-CM

## 2021-07-27 MED ORDER — AMLODIPINE BESYLATE 10 MG PO TABS: 10.0000 mg | ORAL_TABLET | Freq: Every day | ORAL | 3 refills | Status: DC

## 2021-07-27 NOTE — Patient Instructions (Addendum)
Medication Instructions:  Increase Amlodipine 10 mg daily   *If you need a refill on your cardiac medications before your next appointment, please call your pharmacy*   Lab Work: Your physician recommends that you complete labs today BMET  If you have labs (blood work) drawn today and your tests are completely normal, you will receive your results only by: MyChart Message (if you have MyChart) OR A paper copy in the mail If you have any lab test that is abnormal or we need to change your treatment, we will call you to review the results.   Testing/Procedures: NONE ordered at this time of appointment     Follow-Up: At [facility], you and your health needs are our priority.  As part of our continuing mission to provide you with exceptional heart care, we have created designated Provider Care Teams.  These Care Teams include your primary Cardiologist (physician) and Advanced Practice Providers (APPs -  Physician Assistants and Nurse Practitioners) who all work together to provide you with the care you need, when you need it.  We recommend signing up for the patient portal called "MyChart".  Sign up information is provided on this After Visit Summary.  MyChart is used to connect with patients for Virtual Visits (Telemedicine).  Patients are able to view lab/test results, encounter notes, upcoming appointments, etc.  Non-urgent messages can be sent to your provider as well.   To learn more about what you can do with MyChart, go to [email or web address].    Your next appointment:   3 month(s)  The format for your next appointment:   In Person  Provider:   Parke Poisson, MD     Other Instructions Monitor Blood Pressure. If systolic blood pressure (top number) continues to be greater than 140, increase Hydralazine to three times daily.  Referral sent to Nephrology   Important Information About Sugar

## 2021-07-27 NOTE — Progress Notes (Signed)
Cardiology Office Note:    Date:  07/29/2021   ID:  Thomas [name], DOB Jan 08, 1966, MRN [id number]  PCP:  Fleet Contras, MD   [facility] Providers Cardiologist:  Parke Poisson, MD     Referring MD: Fleet Contras, MD   Chief Complaint  Patient presents with   Follow-up    Seen for Dr. Jacques Navy    History of Present Illness:    Thomas [name] is a 56 y.o. 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  Patient was admitted to [facility] in May 2020 after presenting with blurry vision in the right eye.  Systolic blood pressure was 247, diastolic blood pressure was 147.  He was started on hydralazine.  EKG revealed sinus bradycardia with T wave inversion in the inferior leads.  CT of the head revealed a small infarct in the inferior and admitted right centrum semiovale.  He was consulted by neurology who recommended Cro[place]u had MRI which revealed 8 mm acute ischemic nonhemorrhagic right infarct.  Carotid Doppler showed no significant plaque.  2D echo showed EF 55 to 60%, LVH.  He was started on aspirin and Plavix.  He was seen by Dr. Jacques Navy in 2021 due to resistant hypertension.  He does struggle with memory issues since his CVA.  Patient was most recently seen by Robin Searing, NP on 06/29/2021.  His daughter mentions that he was struggling with compliance due to residual memory deficit from previous stroke.  He was only taking his hydralazine however he only took it sporadically.  He describes some dizziness while taking hydralazine due to inconsistent compliance.  He also admits to some dietary indiscretion.  He will restart on carvedilol 12.5 mg twice a day, restart on hydralazine 100 mg twice a day.  Restarted on chlorthalidone 25 mg daily.  Basic metabolic panel shows creatinine up to 2.16.  CBC shows normal hemoglobin.  Lipid panel showed a total cholesterol level 214, LDL 158.   Echocardiogram obtained on 07/12/2021 showed EF 60 to 65%, no regional wall motion abnormality, mild AI.  Dilated aortic root measuring at 43 mm.  He has since been seen by hypertension clinic who restarted on amlodipine.  Patient presents today for follow-up.  Blood pressure has significantly improved in the 150s.  He has been compliant with his blood pressure medication.  I recommend a basic metabolic panel to follow-up on the renal function.  He is aware that he will need to see a nephrologist.  We discussed the importance of blood pressure control to help prevent thoracic aortic aneurysm from getting worse.  I recommend to increase amlodipine to 10 mg daily.  If systolic blood pressure remain greater than 140 mmHg in 2 weeks, he has been instructed to increase hydralazine to 3 times a day.  Previously we held his spironolactone, however spironolactone has made it back on his medication list, patient has been instructed to verify if he is truly taking spironolactone at home.  Otherwise, he can follow-up in 3 months with Dr. Jacques Navy  Past Medical History:  Diagnosis Date   Abnormal EKG    Bipolar disorder (HCC)    Cardiomegaly    CKD (chronic kidney disease), stage II    Family history of heart disease    H/O medication noncompliance    Hyperlipidemia    Hypertension    Stroke (HCC) 11/07/2015   Tobacco abuse     Past  Surgical History:  Procedure Laterality Date   ANKLE FRACTURE SURGERY     NM MYOCAR PERF WALL MOTION  06/28/2011   protocol Bruce, normal perfusion nin all regions, post stress EF 57%,, exercise cap 13METS   TRANSTHORACIC ECHOCARDIOGRAM  06/28/2011   EF=55%, Proximal septal thickening, borderline LA enlargement, boarderline aortic root dialation    Current Medications: Current Meds  Medication Sig   amLODipine (NORVASC) 10 MG tablet Take 1 tablet (10 mg total) by mouth daily.   aspirin EC 81 MG EC tablet Take 1 tablet (81 mg total) by mouth daily.   atorvastatin  (LIPITOR) 80 MG tablet Take 1 tablet (80 mg total) by mouth daily at 6 PM.   carvedilol (COREG) 12.5 MG tablet Take 1 tablet (12.5 mg total) by mouth 2 (two) times daily.   chlorthalidone (HYGROTON) 25 MG tablet Take 1 tablet (25 mg total) by mouth daily.   clopidogrel (PLAVIX) 75 MG tablet Take 1 tablet (75 mg total) by mouth daily.   Evolocumab (REPATHA SURECLICK) [id number] MG/ML SOAJ Inject 140 mg into the skin every 14 (fourteen) days.   hydrALAZINE (APRESOLINE) 100 MG tablet Take 1 tablet (100 mg total) by mouth 2 (two) times daily.   [DISCONTINUED] amLODipine (NORVASC) 5 MG tablet Take 1 tablet (5 mg total) by mouth daily.     Allergies:   Lisinopril   Social History   Socioeconomic History   Marital status: Married    Spouse name: Not on file   Number of children: Not on file   Years of education: Not on file   Highest education level: Not on file  Occupational History   Not on file  Tobacco Use   Smoking status: Former    Packs/day: 0.25    Years: 15.00    Total pack years: 3.75    Types: Cigarettes    Quit date: 11/06/2015    Years since quitting: 5.7   Smokeless tobacco: Never   Tobacco comments:    quit in Juy 2015  Substance and Sexual Activity   Alcohol use: Not Currently    Alcohol/week: 12.0 standard drinks of alcohol    Types: 12 Cans of beer per week    Comment: 12 pack on weekends    Drug use: Yes    Types: Marijuana   Sexual activity: Not on file  Other Topics Concern   Not on file  Social History Narrative   Not on file   Social Determinants of Health   Financial Resource Strain: Not on file  Food Insecurity: Not on file  Transportation Needs: Not on file  Physical Activity: Not on file  Stress: Not on file  Social Connections: Not on file     Family History: The patient's family history includes Asthma in his daughter and son; Diabetes in his father, maternal grandmother, mother, and sister; Hyperlipidemia in his maternal grandmother;  Hypertension in his father, mother, and sister; Stroke in his father, mother, and sister.  ROS:   Please see the history of present illness.     All other systems reviewed and are negative.  EKGs/Labs/Other Studies Reviewed:    The following studies were reviewed today:  Eho 07/12/2021  1. Left ventricular ejection fraction, by estimation, is 60 to 65%. The  left ventricle has normal function. The left ventricle has no regional  wall motion abnormalities. There is moderate concentric left ventricular  hypertrophy. Left ventricular  diastolic parameters are indeterminate. The average left ventricular  global longitudinal strain is -16.5 %.  The global longitudinal strain is  normal.   2. Right ventricular systolic function is normal. The right ventricular  size is normal. Tricuspid regurgitation signal is inadequate for assessing  PA pressure.   3. Left atrial size was mild to moderately dilated.   4. The mitral valve is grossly normal. Trivial mitral valve  regurgitation. No evidence of mitral stenosis.   5. The aortic valve is tricuspid. There is mild calcification of the  aortic valve. There is mild thickening of the aortic valve. Aortic valve  regurgitation is mild. Aortic valve sclerosis is present, with no evidence  of aortic valve stenosis. Aortic  regurgitation PHT measures 471 msec.   6. Aortic dilatation noted. There is mild dilatation of the aortic root,  measuring 43 mm.   7. The inferior vena cava is normal in size with greater than 50%  respiratory variability, suggesting right atrial pressure of 3 mmHg.   Comparison(s): No significant change from prior study.   Conclusion(s)/Recommendation(s): Otherwise normal echocardiogram, with  minor abnormalities described in the report.   EKG:  EKG is not ordered today.    Recent Labs: 06/30/2021: Hemoglobin 16.8; Platelets 202 07/27/2021: BUN 26; Creatinine, Ser 2.21; Potassium 3.2; Sodium 141  Recent Lipid Panel     Component Value Date/Time   CHOL 214 (H) 06/30/2021 1032   TRIG 53 06/30/2021 1032   HDL 47 06/30/2021 1032   CHOLHDL 4.6 06/30/2021 1032   CHOLHDL 4.9 06/07/2018 0452   VLDL 14 06/07/2018 0452   LDLCALC 158 (H) 06/30/2021 1032     Risk Assessment/Calculations:           Physical Exam:    VS:  BP (!) 150/102   Pulse (!) 57   Ht 6\' 3"  (1.905 m)   Wt 200 lb (90.7 kg)   BMI 25.00 kg/m     Wt Readings from Last 3 Encounters:  07/27/21 200 lb (90.7 kg)  07/13/21 206 lb (93.4 kg)  06/29/21 206 lb 9.6 oz (93.7 kg)     GEN:  Well nourished, well developed in no acute distress HEENT: Normal NECK: No JVD; No carotid bruits LYMPHATICS: No lymphadenopathy CARDIAC: RRR, no murmurs, rubs, gallops RESPIRATORY:  Clear to auscultation without rales, wheezing or rhonchi  ABDOMEN: Soft, non-tender, non-distended MUSCULOSKELETAL:  No edema; No deformity  SKIN: Warm and dry NEUROLOGIC:  Alert and oriented x 3 PSYCHIATRIC:  Normal affect   ASSESSMENT:    1. Accelerated hypertension   2. Stage 3b chronic kidney disease ([place])   3. Hyperlipidemia LDL goal <70   4. H/O: CVA (cerebrovascular accident)   5. Aneurysm of ascending aorta without rupture (HCC)    PLAN:    In order of problems listed above:  Accelerated hypertension: After resuming some of the previous medications, his blood pressure is significantly better today.  However it is still elevated in the 150s.  I will increase amlodipine to 10 mg daily.  If after 2 weeks, he has systolic blood pressure remaining above 140 mmHg, I would recommend to increase hydralazine to 3 times a day.  We previously held his spironolactone, however spironolactone is still on his medication list, will asked the patient to check to see if he is still taking spironolactone at home.  Obtain basic metabolic panel  CKD stage IIIb: Obtain basic metabolic panel.  He still has not been seen by nephrology service.  He needed close observation by  nephrology.  Poor renal function likely related to years of uncontrolled high blood  pressure  Hyperlipidemia: On Lipitor and Repatha.  History of CVA: No recent recurrence  Thoracic aortic aneurysm: Recent echocardiogram shows dilated aortic root measuring 43 mm, will need annual image.  Most importantly, will need to control blood pressure very well.  We discussed the correlation between thoracic aortic aneurysm and uncontrolled high blood pressure.           Medication Adjustments/Labs and Tests Ordered: Current medicines are reviewed at length with the patient today.  Concerns regarding medicines are outlined above.  Orders Placed This Encounter  Procedures   Basic metabolic panel   Ambulatory referral to Nephrology   Meds ordered this encounter  Medications   amLODipine (NORVASC) 10 MG tablet    Sig: Take 1 tablet (10 mg total) by mouth daily.    Dispense:  90 tablet    Refill:  3    Patient Instructions  Medication Instructions:  Increase Amlodipine 10 mg daily   *If you need a refill on your cardiac medications before your next appointment, please call your pharmacy*   Lab Work: Your physician recommends that you complete labs today BMET  If you have labs (blood work) drawn today and your tests are completely normal, you will receive your results only by: MyChart Message (if you have MyChart) OR A paper copy in the mail If you have any lab test that is abnormal or we need to change your treatment, we will call you to review the results.   Testing/Procedures: NONE ordered at this time of appointment     Follow-Up: At [facility], you and your health needs are our priority.  As part of our continuing mission to provide you with exceptional heart care, we have created designated Provider Care Teams.  These Care Teams include your primary Cardiologist (physician) and Advanced Practice Providers (APPs -  Physician Assistants and Nurse Practitioners) who all work  together to provide you with the care you need, when you need it.  We recommend signing up for the patient portal called "MyChart".  Sign up information is provided on this After Visit Summary.  MyChart is used to connect with patients for Virtual Visits (Telemedicine).  Patients are able to view lab/test results, encounter notes, upcoming appointments, etc.  Non-urgent messages can be sent to your provider as well.   To learn more about what you can do with MyChart, go to [email or web address].    Your next appointment:   3 month(s)  The format for your next appointment:   In Person  Provider:   Parke Poisson, MD     Other Instructions Monitor Blood Pressure. If systolic blood pressure (top number) continues to be greater than 140, increase Hydralazine to three times daily.  Referral sent to Nephrology   Important Information About Sugar         Ramond Dial, [place]  07/29/2021 11:49 PM    [facility]

## 2021-07-28 LAB — BASIC METABOLIC PANEL
BUN/Creatinine Ratio: 12 (ref 9–20)
BUN: 26 mg/dL — ABNORMAL HIGH (ref 6–24)
CO2: 27 mmol/L (ref 20–29)
Calcium: 9.6 mg/dL (ref 8.7–10.2)
Chloride: 101 mmol/L (ref 96–106)
Creatinine, Ser: 2.21 mg/dL — ABNORMAL HIGH (ref 0.76–1.27)
Glucose: 83 mg/dL (ref 70–99)
Potassium: 3.2 mmol/L — ABNORMAL LOW (ref 3.5–5.2)
Sodium: 141 mmol/L (ref 134–144)
eGFR: 34 mL/min/{1.73_m2} — ABNORMAL LOW (ref >59)

## 2021-07-29 ENCOUNTER — Encounter: Payer: Self-pay | Admitting: Physician Assistant

## 2021-07-29 NOTE — Progress Notes (Signed)
Kidney function is stable, but potassium is borderline low. We previously decided to hold off on restarting spironolactone, however spironolactone made it back on his med list during recent visit, I asked Thomas [name] to verify to see if he is still taking spironolactone at home. Please verify with Thomas [name] to see if he is still taking spironolactone. Depend on his answer, it may explain why his potassium is slightly lower now.

## 2021-08-01 ENCOUNTER — Other Ambulatory Visit: Payer: Self-pay

## 2021-08-01 DIAGNOSIS — Z79899 Other long term (current) drug therapy: Secondary | ICD-10-CM

## 2021-08-01 MED ORDER — POTASSIUM CHLORIDE ER 10 MEQ PO TBCR: 10.0000 meq | EXTENDED_RELEASE_TABLET | Freq: Every day | ORAL | 3 refills | Status: DC

## 2021-08-07 ENCOUNTER — Encounter: Payer: Self-pay | Admitting: Pharmacist Clinician (PhC)/ Clinical Pharmacy Specialist

## 2021-08-07 ENCOUNTER — Ambulatory Visit (INDEPENDENT_AMBULATORY_CARE_PROVIDER_SITE_OTHER): Payer: Medicaid Other | Admitting: Pharmacist Clinician (PhC)/ Clinical Pharmacy Specialist

## 2021-08-07 ENCOUNTER — Telehealth: Payer: Self-pay

## 2021-08-07 DIAGNOSIS — I10 Essential (primary) hypertension: Secondary | ICD-10-CM

## 2021-08-07 MED ORDER — POTASSIUM CHLORIDE ER 10 MEQ PO TBCR: 10.0000 meq | EXTENDED_RELEASE_TABLET | Freq: Every day | ORAL | 3 refills | Status: DC

## 2021-08-07 MED ORDER — AMLODIPINE BESYLATE 10 MG PO TABS: 10.0000 mg | ORAL_TABLET | Freq: Every day | ORAL | 3 refills | Status: DC

## 2021-08-07 MED ORDER — CLOPIDOGREL BISULFATE 75 MG PO TABS: 75.0000 mg | ORAL_TABLET | Freq: Every morning | ORAL | 3 refills | Status: DC

## 2021-08-07 MED ORDER — ATORVASTATIN CALCIUM 80 MG PO TABS: 80.0000 mg | ORAL_TABLET | Freq: Every day | ORAL | 3 refills | Status: DC

## 2021-08-07 MED ORDER — CHLORTHALIDONE 25 MG PO TABS: 25.0000 mg | ORAL_TABLET | Freq: Every morning | ORAL | 3 refills | Status: DC

## 2021-08-07 MED ORDER — HYDRALAZINE HCL 100 MG PO TABS: 100.0000 mg | ORAL_TABLET | Freq: Three times a day (TID) | ORAL | 3 refills | Status: DC

## 2021-08-07 MED ORDER — ASPIRIN 81 MG PO TBEC: 81.0000 mg | DELAYED_RELEASE_TABLET | Freq: Every day | ORAL | 1 refills | Status: DC

## 2021-08-07 MED ORDER — CARVEDILOL 12.5 MG PO TABS: ORAL_TABLET | ORAL | 3 refills | Status: DC

## 2021-08-07 NOTE — Patient Instructions (Signed)
Return for a a follow up appointment August 14 at 1:30 pm  Check your blood pressure at home daily and keep record of the readings.  Take your BP meds as follows:  I will contact [facility] to get your next set of cards correct.  Need to add in amlodipine 10 mg daily as well as atorvastatin 80 mg.  Will also increase hydralazine to 100 mg three times daily.    Bring all of your meds, your BP cuff and your record of home blood pressures to your next appointment.  Exercise as you're able, try to walk approximately 30 minutes per day.  Keep salt intake to a minimum, especially watch canned and prepared boxed foods.  Eat more fresh fruits and vegetables and fewer canned items.  Avoid eating in fast food restaurants.    HOW TO TAKE YOUR BLOOD PRESSURE: Rest 5 minutes before taking your blood pressure.  Don't smoke or drink caffeinated beverages for at least 30 minutes before. Take your blood pressure before (not after) you eat. Sit comfortably with your back supported and both feet on the floor (don't cross your legs). Elevate your arm to heart level on a table or a desk. Use the proper sized cuff. It should fit smoothly and snugly around your bare upper arm. There should be enough room to slip a fingertip under the cuff. The bottom edge of the cuff should be 1 inch above the crease of the elbow. Ideally, take 3 measurements at one sitting and record the average.

## 2021-08-07 NOTE — Progress Notes (Signed)
HPI:  Thomas [name] is a 56 y.o. male patient of Dr Jacques Navy, with a PMH below who presents today for hypertension clinic follow up (see PMH below).  Patient has had problems with compliance, in part due to memory issues brought about by multiple CVA/TIA. He has been seen in the hypertension clinic several times. His daughter has been helping him with medications, staying on track and being compliant.  Despite her help, he still has problems with compliance.   I last saw him in December 2021 and he was not seen again in [facility] until earlier this month when he was seen by Robin Searing PA for resistant hypertension.  BP at that appointment was 180/110.  His daughter noted continued struggles with compliance and proper dietary restrictions.  Arrangements were made to get prescriptions in blister packs to help with memory issues and he was started with carvedilol 12.5 bid, chlorthalidone 25 mg qd, hydralazine 100 mg bid.  These three, along with clopidogrel, were sent to [facility] and put in unit dose bubble cards for hopefully better compliance.  He also has not used his Repatha injections for some time, will need to check on PA to see if still active.    At his last visit we restarted amlodipine 5 mg daily, anticipating need to go to 10 mg, but wanted to avoid risk of LEE.  Sine his last visit, GFR dropped to 34.  There is some confusion about whether he is taking spironolactone.  I asked that he bring all medications to his visit today.  He was seen by Azalee Course 10 days ago and pressure was improved to 150/102.  Amlodipine increased to 10 mg.  Today he is in the office and brings in the dose bubble pack card from [facility] pharmacy for last week.  Unfortunately he is only taking 4 meds, and for some reason the pharmacy packed his carvedilol and hydralazine (both bid) as breakfast and lunch.  He was only taking carvedilol, hydralazine, clopidogrel and chlorthalidone in the mornings, with second doses of  carvedilol and hydralazine at noon.  Nothing after that.    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  hyperlipidemia LDL 85 on atorvastatin 80, Repatha 140 - some compliance issues  CK3 GFR continues to trend down, most recent at 39  Tobacco disorder Quit 1 months ago - still quit  Drug abuse Tested positive for cocaine and THC in May 2020    Blood Pressure Goal:  130/80 (140/90 if unable to tolerate lower goal)  Current Medications: carvedilol 12.5 mg twice daily Chlorthalidone 25mg  daily Hydralazine 100mg  twice daily   Family Hx: mother hypertension, now 37; father diabetes, hypertension, died at 76-70; sister died from stroke at 54 (half-sister on dad's side); 4 children - 2 with asthma and epilepsy, 1 with just asthma  Social Hx: no alcohol, coffee daily, some soda Pepsi (sips from someone else's can); quit smoking 1 months ago.  Diet: notes eating more home cooked foods in past few weeks ; too many snacks recently - candy  Omelette this morning with fruit and tomatoes; 61-66 sausage (likes frosted flakes)  No lunch most days  Baked chicken green beans, mashed potatoes  Snacking - ice cream, candy  Exercise:  plays congos at church, walks around block  Home BP readings: no recent home readings, by recall  2 times over 200, mostly 180-200/ diastolic 100-130  Intolerances:  Lisinopril - angioedema w/hospitalization  Labs:  6/23:  Na  139, K 3.7, Glu 85, BUN 20, SCr 2.16, GFR 35 TC 214, TG 53, HDL 47, LDL 158  Wt Readings from Last 3 Encounters:  07/27/21 200 lb (90.7 kg)  07/13/21 206 lb (93.4 kg)  06/29/21 206 lb 9.6 oz (93.7 kg)   BP Readings from Last 3 Encounters:  08/07/21 (!) 176/109  07/27/21 (!) 150/102  07/13/21 (!) 191/114   Pulse Readings from Last 3 Encounters:  08/07/21 (!) 58  07/27/21 (!) 57  07/13/21 (!) 56    Current Outpatient Medications  Medication Sig Dispense Refill   carvedilol (COREG) 12.5 MG tablet Take 1 tablet by mouth twice  daily, morning and evening 180 tablet 3   chlorthalidone (HYGROTON) 25 MG tablet Take 1 tablet (25 mg total) by mouth in the morning. 90 tablet 3   clopidogrel (PLAVIX) 75 MG tablet Take 1 tablet (75 mg total) by mouth in the morning. 90 tablet 3   hydrALAZINE (APRESOLINE) 100 MG tablet Take 1 tablet (100 mg total) by mouth 3 (three) times daily. 270 tablet 3   amLODipine (NORVASC) 10 MG tablet Take 1 tablet (10 mg total) by mouth at bedtime. (Patient not taking: Reported on 08/07/2021) 90 tablet 3   aspirin EC 81 MG tablet Take 1 tablet (81 mg total) by mouth daily. (Patient not taking: Reported on 08/07/2021) 30 tablet 1   atorvastatin (LIPITOR) 80 MG tablet Take 1 tablet (80 mg total) by mouth at bedtime. (Patient not taking: Reported on 08/07/2021) 90 tablet 3   Evolocumab (REPATHA SURECLICK) 140 MG/ML SOAJ Inject 140 mg into the skin every 14 (fourteen) days. (Patient not taking: Reported on 08/07/2021) 6 mL 3   potassium chloride (KLOR-CON) 10 MEQ tablet Take 1 tablet (10 mEq total) by mouth daily. 90 tablet 3   No current facility-administered medications for this visit.    Allergies  Allergen Reactions   Lisinopril Swelling    Facial swelling    Past Medical History:  Diagnosis Date   Abnormal EKG    Bipolar disorder (HCC)    Cardiomegaly    CKD (chronic kidney disease), stage II    Family history of heart disease    H/O medication noncompliance    Hyperlipidemia    Hypertension    Stroke (HCC) 11/07/2015   Tobacco abuse     Blood pressure (!) 176/109, pulse (!) 58.  Accelerated hypertension Patient with hypertension, uncontrolled due to lack of understanding brought on by multiple previous strokes.  He is now getting medications from [facility] on bubble cards, but there are only 4 meds that were included.  Will renew all medications to [facility], and include times of day.   This will hopefully get him on the right track.  He will need to pick up the next 4 weeks  of cards to start this next Sunday.  I will have him check BP at home when he can and return in 4 weeks for follow up.  He was asked to bring all medication cards with him at that time.     Phillips Hay PharmD CPP [facility] [address] [place] [address] 08/07/2021 11:56 AM

## 2021-08-07 NOTE — Telephone Encounter (Signed)
error 

## 2021-08-07 NOTE — Assessment & Plan Note (Addendum)
Patient with hypertension, uncontrolled due to lack of understanding brought on by multiple previous strokes.  He is now getting medications from [facility] on bubble cards, but there are only 4 meds that were included.  Will renew all medications to [facility], and include times of day.   This will hopefully get him on the right track.  He will need to pick up the next 4 weeks of cards to start this next Sunday.  I will have him check BP at home when he can and return in 4 weeks for follow up.  He was asked to bring all medication cards with him at that time.

## 2021-08-10 ENCOUNTER — Telehealth: Payer: Self-pay | Admitting: Pharmacist Clinician (PhC)/ Clinical Pharmacy Specialist

## 2021-08-15 NOTE — Telephone Encounter (Signed)
Cannot do PA for Repatha w/o new managed medicaid card.  Family aware

## 2021-08-16 ENCOUNTER — Ambulatory Visit: Payer: Self-pay | Admitting: Internal Medicine

## 2021-08-30 ENCOUNTER — Other Ambulatory Visit: Payer: Self-pay

## 2021-08-30 DIAGNOSIS — Z79899 Other long term (current) drug therapy: Secondary | ICD-10-CM

## 2021-09-04 ENCOUNTER — Ambulatory Visit (INDEPENDENT_AMBULATORY_CARE_PROVIDER_SITE_OTHER): Payer: Medicaid Other | Admitting: Pharmacist Clinician (PhC)/ Clinical Pharmacy Specialist

## 2021-09-04 ENCOUNTER — Other Ambulatory Visit: Payer: Self-pay | Admitting: Internal Medicine

## 2021-09-04 ENCOUNTER — Encounter: Payer: Self-pay | Admitting: Pharmacist Clinician (PhC)/ Clinical Pharmacy Specialist

## 2021-09-04 DIAGNOSIS — I10 Essential (primary) hypertension: Secondary | ICD-10-CM | POA: Diagnosis not present

## 2021-09-04 NOTE — Patient Instructions (Signed)
Return for a a follow up appointment with Dr. Jacques Navy in [place]  Check your blood pressure at home daily and keep record of the readings.  Take your BP meds as follows:  Continue with your current medications  Bring all of your meds, your BP cuff and your record of home blood pressures to your next appointment.  Exercise as you're able, try to walk approximately 30 minutes per day.  Keep salt intake to a minimum, especially watch canned and prepared boxed foods.  Eat more fresh fruits and vegetables and fewer canned items.  Avoid eating in fast food restaurants.    HOW TO TAKE YOUR BLOOD PRESSURE: Rest 5 minutes before taking your blood pressure.  Don't smoke or drink caffeinated beverages for at least 30 minutes before. Take your blood pressure before (not after) you eat. Sit comfortably with your back supported and both feet on the floor (don't cross your legs). Elevate your arm to heart level on a table or a desk. Use the proper sized cuff. It should fit smoothly and snugly around your bare upper arm. There should be enough room to slip a fingertip under the cuff. The bottom edge of the cuff should be 1 inch above the crease of the elbow. Ideally, take 3 measurements at one sitting and record the average.

## 2021-09-04 NOTE — Assessment & Plan Note (Signed)
Patient with resistant hypertension, now on 5 medications to control pressure.  He has been getting medications in blister packs for the past couple of months and this past 4 weeks the cards had all his oral meds.   Blood pressure better today than any Epic recorded readings in the past several years.  Will have him continue with this regimen and he is due for follow up with Dr. Jacques Navy in the next 2-3 months.

## 2021-09-04 NOTE — Progress Notes (Signed)
HPI:  Thomas [name] is a 56 y.o. male patient of Dr Jacques Navy, with a PMH below who presents today for hypertension clinic follow up (see PMH below).  Patient has had problems with compliance, in part due to memory issues brought about by multiple CVA/TIA. He has been seen in the hypertension clinic several times. His daughter has been helping him with medications, staying on track and being compliant.  Despite her help, he still has problems with compliance.   I last saw him in December 2021 and he was not seen again in [facility] until earlier this month when he was seen by Robin Searing PA for resistant hypertension.  BP at that appointment was 180/110.  His daughter noted continued struggles with compliance and proper dietary restrictions.  Arrangements were made to get prescriptions in blister packs to help with memory issues and he was started with carvedilol 12.5 bid, chlorthalidone 25 mg qd, hydralazine 100 mg bid.  These three, along with clopidogrel, were sent to [facility] and put in unit dose bubble cards for hopefully better compliance.  He also has not used his Repatha injections for some time, will need to check on PA to see if still active.    At his last visit we restarted amlodipine 5 mg daily, anticipating need to go to 10 mg, but wanted to avoid risk of LEE.  Sine his last visit, GFR dropped to 34.  There is some confusion about whether he is taking spironolactone.  I asked that he bring all medications to his visit today.  He was seen by Azalee Course 10 days ago and pressure was improved to 150/102.  Amlodipine increased to 10 mg.  Today he is in the office and brings in the dose bubble pack card from [facility] pharmacy for last week.  Unfortunately he is only taking 4 meds, and for some reason the pharmacy packed his carvedilol and hydralazine (both bid) as breakfast and lunch.  He was only taking carvedilol, hydralazine, clopidogrel and chlorthalidone in the mornings, with second doses of  carvedilol and hydralazine at noon.  Nothing after that.  Sent all prescriptions to pharmacy for inclusion in bubble packs - including increase in amlodipine to 10 mg.   Today he returns for follow up.  States that his daughter has told him home BP readings are looking good and she is very happy with his results.  He states feeling well overall.    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  hyperlipidemia LDL 85 on atorvastatin 80, Repatha 140 - some compliance issues  CK3 GFR continues to trend down, most recent at 4  Tobacco disorder Quit 1 months ago - still quit  Drug abuse Tested positive for cocaine and THC in May 2020    Blood Pressure Goal:  130/80 (140/90 if unable to tolerate lower goal)  Current Medications: carvedilol 12.5 mg twice daily Chlorthalidone 25mg  daily Hydralazine 100mg  three times daily  Spironolactone 25 mg daily Amlodipine 10 mg daily  Family Hx: mother hypertension, now 37; father diabetes, hypertension, died at 80-70; sister died from stroke at 7 (half-sister on dad's side); 4 children - 2 with asthma and epilepsy, 1 with just asthma  Social Hx: no alcohol, coffee daily, some soda Pepsi (sips from someone else's can); quit smoking 1 months ago.  Diet: notes eating more home cooked foods in past few weeks ; too many snacks recently - candy  Omelette this morning with fruit and tomatoes; 61-66  sausage (likes frosted flakes)  No lunch most days  Baked chicken green beans, mashed potatoes  Snacking - ice cream, candy  Exercise:  plays congos at church, walks around block  Home BP readings: no recent home readings, by recall  2 times over 200, mostly 180-200/ diastolic 100-130  Intolerances:  Lisinopril - angioedema w/hospitalization  Labs:  6/23:  Na 139, K 3.7, Glu 85, BUN 20, SCr 2.16, GFR 35 TC 214, TG 53, HDL 47, LDL 158  Wt Readings from Last 3 Encounters:  07/27/21 200 lb (90.7 kg)  07/13/21 206 lb (93.4 kg)  06/29/21 206 lb 9.6 oz  (93.7 kg)   BP Readings from Last 3 Encounters:  09/04/21 131/83  08/07/21 (!) 176/109  07/27/21 (!) 150/102   Pulse Readings from Last 3 Encounters:  09/04/21 (!) 52  08/07/21 (!) 58  07/27/21 (!) 57    Current Outpatient Medications  Medication Sig Dispense Refill   amLODipine (NORVASC) 10 MG tablet Take 1 tablet (10 mg total) by mouth at bedtime. 90 tablet 3   aspirin EC 81 MG tablet Take 1 tablet (81 mg total) by mouth daily. 30 tablet 1   atorvastatin (LIPITOR) 80 MG tablet Take 1 tablet (80 mg total) by mouth at bedtime. 90 tablet 3   carvedilol (COREG) 12.5 MG tablet Take 1 tablet by mouth twice daily, morning and evening 180 tablet 3   chlorthalidone (HYGROTON) 25 MG tablet Take 1 tablet (25 mg total) by mouth in the morning. 90 tablet 3   clopidogrel (PLAVIX) 75 MG tablet Take 1 tablet (75 mg total) by mouth in the morning. 90 tablet 3   hydrALAZINE (APRESOLINE) 100 MG tablet Take 1 tablet (100 mg total) by mouth 3 (three) times daily. 270 tablet 3   spironolactone (ALDACTONE) 25 MG tablet Take 25 mg by mouth daily.     Evolocumab (REPATHA SURECLICK) 140 MG/ML SOAJ Inject 140 mg into the skin every 14 (fourteen) days. (Patient not taking: Reported on 08/07/2021) 6 mL 3   No current facility-administered medications for this visit.    Allergies  Allergen Reactions   Lisinopril Swelling    Facial swelling    Past Medical History:  Diagnosis Date   Abnormal EKG    Bipolar disorder (HCC)    Cardiomegaly    CKD (chronic kidney disease), stage II    Family history of heart disease    H/O medication noncompliance    Hyperlipidemia    Hypertension    Stroke (HCC) 11/07/2015   Tobacco abuse     Blood pressure 131/83, pulse (!) 52.  HYPERTENSION, BENIGN ESSENTIAL Patient with resistant hypertension, now on 5 medications to control pressure.  He has been getting medications in blister packs for the past couple of months and this past 4 weeks the cards had all his oral  meds.   Blood pressure better today than any Epic recorded readings in the past several years.  Will have him continue with this regimen and he is due for follow up with Dr. Jacques Navy in the next 2-3 months.     Phillips Hay PharmD CPP [facility] [address] [place] [address] 09/04/2021 1:51 PM

## 2021-10-12 ENCOUNTER — Encounter: Payer: Self-pay | Admitting: Pharmacist

## 2021-10-12 ENCOUNTER — Other Ambulatory Visit: Payer: Self-pay | Admitting: Pharmacist

## 2021-10-12 DIAGNOSIS — E7849 Other hyperlipidemia: Secondary | ICD-10-CM

## 2021-10-12 DIAGNOSIS — I639 Cerebral infarction, unspecified: Secondary | ICD-10-CM

## 2021-10-12 MED ORDER — REPATHA SURECLICK 140 MG/ML ~~LOC~~ SOAJ: 140.0000 mg | SUBCUTANEOUS | 3 refills | Status: DC

## 2021-10-12 NOTE — Telephone Encounter (Signed)
This encounter was created in error - please disregard.

## 2021-11-07 ENCOUNTER — Ambulatory Visit: Payer: Medicaid Other | Attending: Internal Medicine | Admitting: Internal Medicine

## 2022-02-05 IMAGING — DX DG HAND COMPLETE 3+V*L*
3 series · 3 of 3 positions shown · non-contrast
Comparison: None.

CLINICAL DATA: Left hand pain.  Trauma.

EXAM:
LEFT HAND - COMPLETE 3+ VIEW

[hand pa]
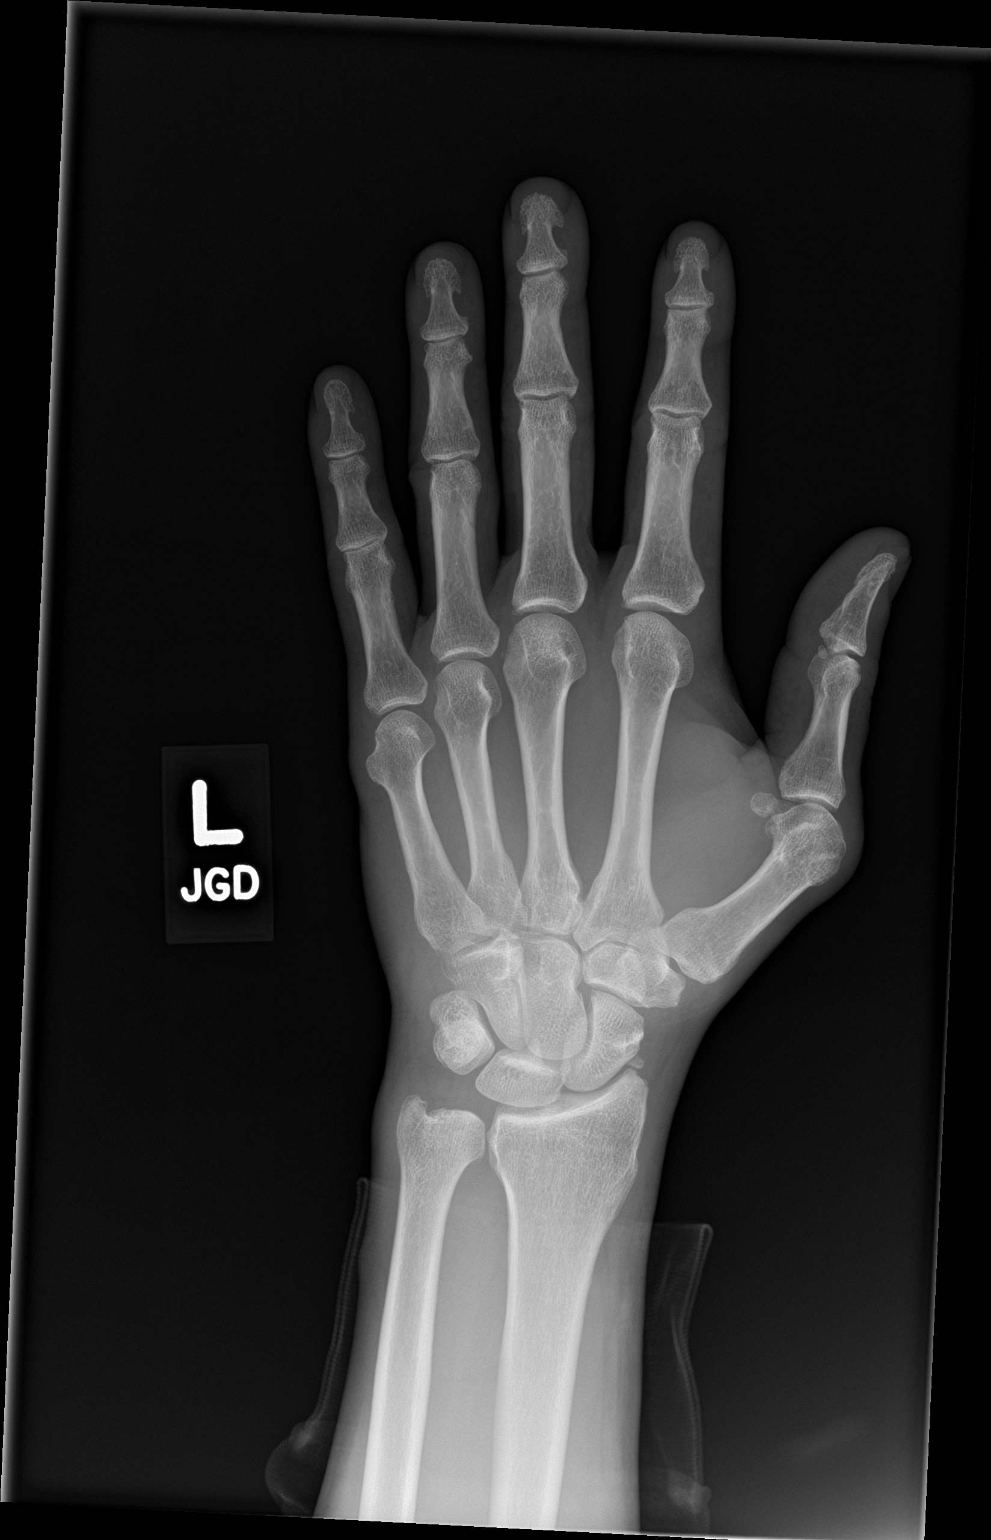

[hand obl]
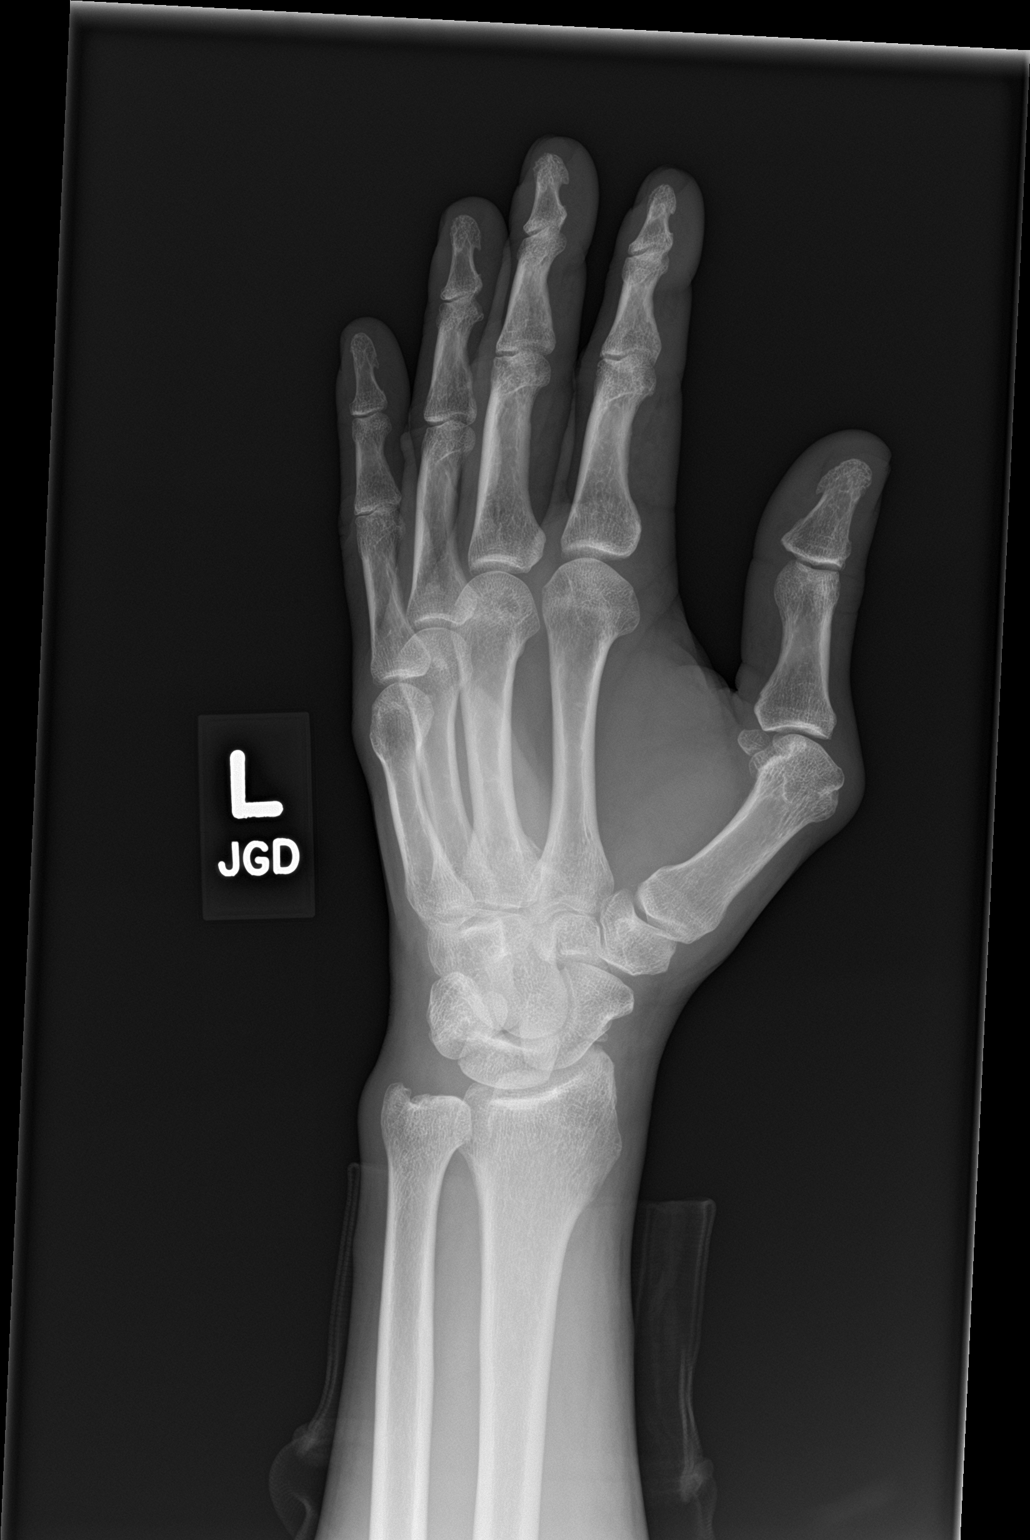

[hand lat]
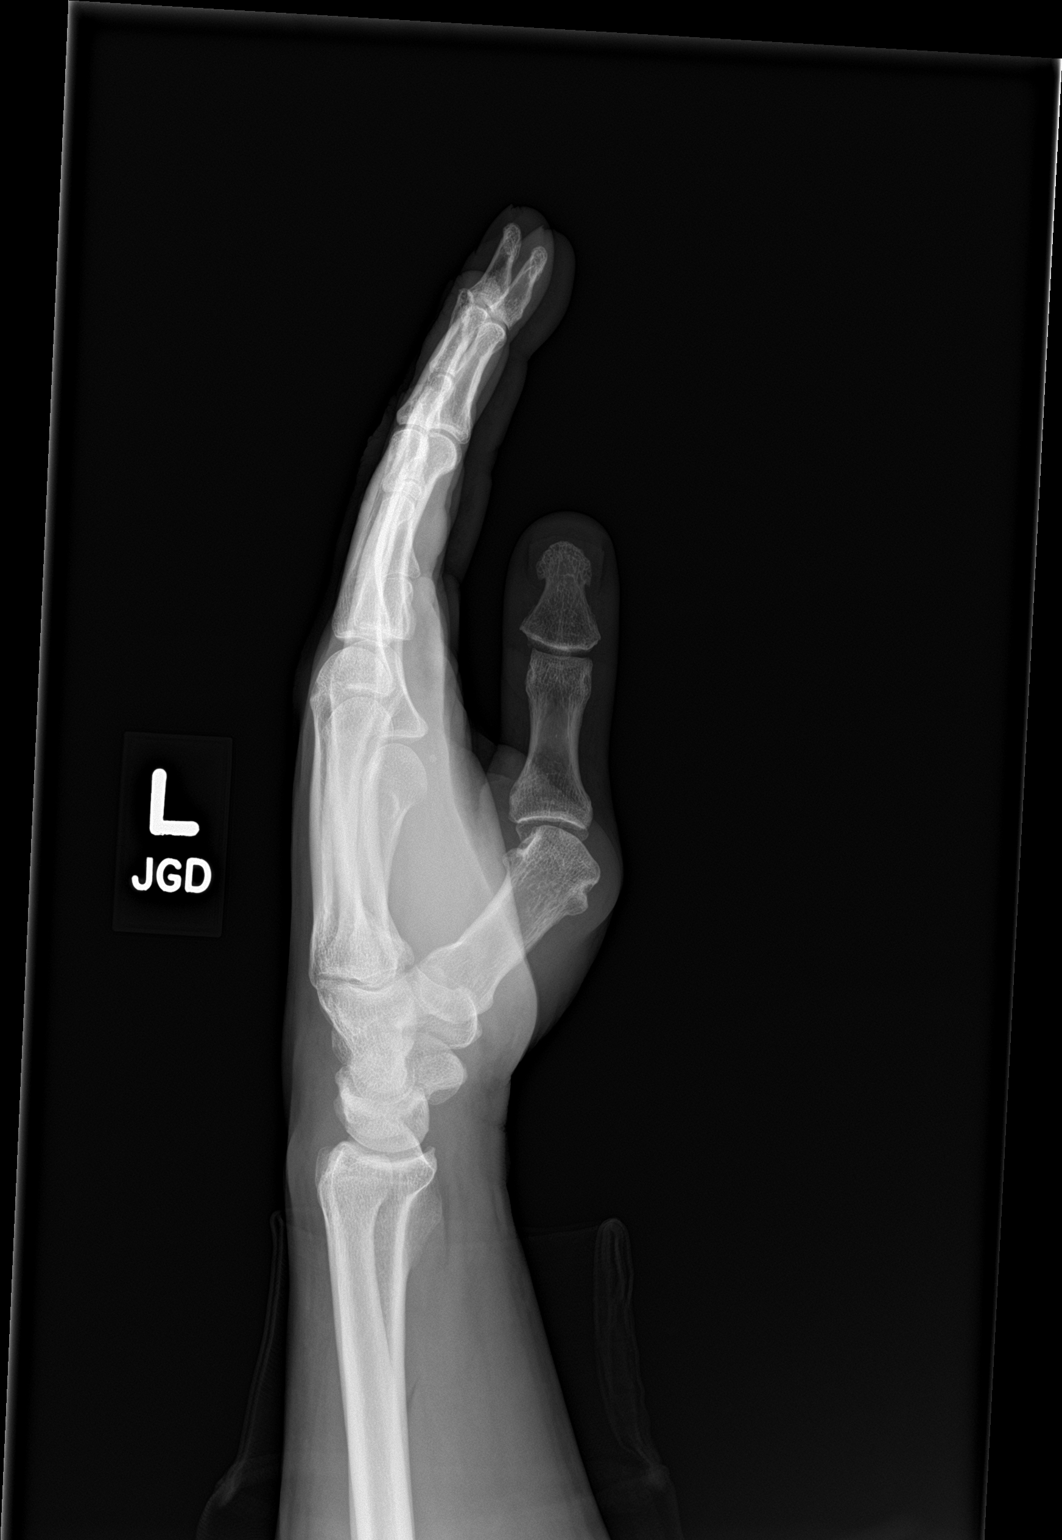

[3 of 3 positions shown; findings below may reference images not displayed]

FINDINGS: There is no evidence of fracture or dislocation. There is no
evidence of arthropathy or other focal bone abnormality. Soft
tissues are unremarkable.
IMPRESSION: Negative.

## 2022-02-19 ENCOUNTER — Other Ambulatory Visit: Payer: Self-pay

## 2022-02-19 ENCOUNTER — Encounter (HOSPITAL_COMMUNITY): Payer: Self-pay | Admitting: Emergency Medicine

## 2022-02-19 ENCOUNTER — Emergency Department (HOSPITAL_COMMUNITY)
Admission: EM | Admit: 2022-02-19 | Discharge: 2022-02-20 | Discharge status: Against Medical Advice | Payer: Medicaid Other | Attending: Emergency Medicine | Admitting: Emergency Medicine

## 2022-02-19 ENCOUNTER — Emergency Department (HOSPITAL_COMMUNITY): Payer: Medicaid Other

## 2022-02-19 DIAGNOSIS — I129 Hypertensive chronic kidney disease with stage 1 through stage 4 chronic kidney disease, or unspecified chronic kidney disease: Secondary | ICD-10-CM | POA: Diagnosis not present

## 2022-02-19 DIAGNOSIS — R42 Dizziness and giddiness: Secondary | ICD-10-CM | POA: Insufficient documentation

## 2022-02-19 DIAGNOSIS — Z8673 Personal history of transient ischemic attack (TIA), and cerebral infarction without residual deficits: Secondary | ICD-10-CM | POA: Diagnosis not present

## 2022-02-19 DIAGNOSIS — N182 Chronic kidney disease, stage 2 (mild): Secondary | ICD-10-CM | POA: Diagnosis not present

## 2022-02-19 DIAGNOSIS — Z5321 Procedure and treatment not carried out due to patient leaving prior to being seen by health care provider: Secondary | ICD-10-CM | POA: Insufficient documentation

## 2022-02-19 DIAGNOSIS — R0602 Shortness of breath: Secondary | ICD-10-CM | POA: Insufficient documentation

## 2022-02-19 LAB — COMPREHENSIVE METABOLIC PANEL
ALT: 211 U/L — ABNORMAL HIGH (ref 0–44)
AST: 122 U/L — ABNORMAL HIGH (ref 15–41)
Albumin: 4.2 g/dL (ref 3.5–5.0)
Alkaline Phosphatase: 66 U/L (ref 38–126)
Anion gap: 9 (ref 5–15)
BUN: 23 mg/dL — ABNORMAL HIGH (ref 6–20)
CO2: 29 mmol/L (ref 22–32)
Calcium: 9.3 mg/dL (ref 8.9–10.3)
Chloride: 97 mmol/L — ABNORMAL LOW (ref 98–111)
Creatinine, Ser: 2.41 mg/dL — ABNORMAL HIGH (ref 0.61–1.24)
GFR, Estimated: 31 mL/min — ABNORMAL LOW (ref >60)
Glucose, Bld: 125 mg/dL — ABNORMAL HIGH (ref 70–99)
Potassium: 3 mmol/L — ABNORMAL LOW (ref 3.5–5.1)
Sodium: 135 mmol/L (ref 135–145)
Total Bilirubin: 1.2 mg/dL (ref 0.3–1.2)
Total Protein: 7.9 g/dL (ref 6.5–8.1)

## 2022-02-19 LAB — CBC WITH DIFFERENTIAL/PLATELET
Abs Immature Granulocytes: 0.01 10*3/uL (ref 0.00–0.07)
Basophils Absolute: 0 10*3/uL (ref 0.0–0.1)
Basophils Relative: 1 %
Eosinophils Absolute: 0.1 10*3/uL (ref 0.0–0.5)
Eosinophils Relative: 3 %
HCT: 47.1 % (ref 39.0–52.0)
Hemoglobin: 15.9 g/dL (ref 13.0–17.0)
Immature Granulocytes: 0 %
Lymphocytes Relative: 33 %
Lymphs Abs: 1.4 10*3/uL (ref 0.7–4.0)
MCH: 28.6 pg (ref 26.0–34.0)
MCHC: 33.8 g/dL (ref 30.0–36.0)
MCV: 84.7 fL (ref 80.0–100.0)
Monocytes Absolute: 0.7 10*3/uL (ref 0.1–1.0)
Monocytes Relative: 17 %
Neutro Abs: 2 10*3/uL (ref 1.7–7.7)
Neutrophils Relative %: 46 %
Platelets: 202 10*3/uL (ref 150–400)
RBC: 5.56 MIL/uL (ref 4.22–5.81)
RDW: 13.2 % (ref 11.5–15.5)
WBC: 4.3 10*3/uL (ref 4.0–10.5)
nRBC: 0 % (ref 0.0–0.2)

## 2022-02-19 LAB — TROPONIN I (HIGH SENSITIVITY)
Troponin I (High Sensitivity): 50 ng/L — ABNORMAL HIGH (ref <18)
Troponin I (High Sensitivity): 45 ng/L — ABNORMAL HIGH (ref <18)

## 2022-02-19 NOTE — ED Provider Triage Note (Signed)
Emergency Medicine Provider Triage Evaluation Note  Thomas [name] , a 57 y.o. male  was evaluated in triage.  Pt complains of episode of dizziness around 1 PM today.  States he was sitting on his couch when he began feeling dizzy and sweaty.  He stood up abruptly.  Began having some shortness of breath, put his leg up, and slid down to the ground.  This lasted no more than 5 minutes and relieved on its own.  No dizziness or shortness of breath or symptoms since then.  Did not fall or hit head.  Not on anticoagulation.  No pain, neurodeficits, or other accompanying symptoms.  No recent fevers or chills.  Hx of CVA, hypertension, CKD stage II, bipolar disorder  Review of Systems  Positive:  Negative: See above  Physical Exam  There were no vitals taken for this visit. Gen:   Awake, no distress   Resp:  Normal effort  MSK:   Moves extremities without difficulty  Other:  Sitting comfortably.  PERRLA.  EOMI to provider's face.  Coordination and sensation grossly intact.  No dysphagia or dysarthria.  No facial asymmetry.  No truncal deviation.  Chest nonTTP.  Medical Decision Making  Medically screening exam initiated at 3:27 PM.  Appropriate orders placed.  Lynnell Jude was informed that the remainder of the evaluation will be completed by another provider, this initial triage assessment does not replace that evaluation, and the importance of remaining in the ED until their evaluation is complete.     Prince Rome, PA-C [id number]

## 2022-02-19 NOTE — ED Triage Notes (Addendum)
Pt from home via [facility] with reports of dizziness. Pt reports SHOB when the dizziness started. Pt has old right sided weakness from previous stroke.

## 2022-02-26 ENCOUNTER — Telehealth: Payer: Self-pay

## 2022-02-26 NOTE — Telephone Encounter (Signed)
Received call from patients daughter who states that patient had a syncopal event a few weeks ago and reports low BP during that time as well as elevated creatinine, and low K. Patient is following with [facility]. Requesting appointment. Offered next available with PA this week to discuss but would like to f/u with Dr. Margaretann Loveless. Appointment scheduled for 2/20 at 10am. Will forward to MD to make aware.

## 2022-03-13 ENCOUNTER — Encounter: Payer: Self-pay | Admitting: Internal Medicine

## 2022-03-13 ENCOUNTER — Ambulatory Visit: Payer: Medicaid Other | Attending: Internal Medicine | Admitting: Internal Medicine

## 2022-03-13 VITALS — BP 140/80 | HR 57 | Ht 75.0 in | Wt 208.6 lb

## 2022-03-13 DIAGNOSIS — I1A0 Resistant hypertension: Secondary | ICD-10-CM

## 2022-03-13 DIAGNOSIS — N1832 Chronic kidney disease, stage 3b: Secondary | ICD-10-CM

## 2022-03-13 DIAGNOSIS — I639 Cerebral infarction, unspecified: Secondary | ICD-10-CM

## 2022-03-13 DIAGNOSIS — E785 Hyperlipidemia, unspecified: Secondary | ICD-10-CM

## 2022-03-13 NOTE — Progress Notes (Signed)
Cardiology Office Note:    Date:  03/13/2022  ID:  Thomas [name], DOB [birth year], MRN [id number]  PCP:  Nolene Ebbs, MD  Cardiologist:  Elouise Munroe, MD  Electrophysiologist:  None   Referring MD: Nolene Ebbs, MD   Chief Complaint: HTN  History of Present Illness:    Thomas [name] is a 57 y.o. male well known to my clinic. His daughter is a CMA in our practice. He presents for follow up  On 05/08/2019 he had been doing well on current therapy, and had been setting a timer for his medications.  His blood pressure is much better controlled than our initial encounter.  He was last seen by hypertension clinic on March 30.  Current therapy included amlodipine 10 mg daily, Coreg 12.5 mg twice daily, chlorthalidone 25 mg daily, and hydralazine 100 mg twice daily.  He was found to have compliance issues which were due to memory issues brought about by CVA/TIA. Arrangements were made to put his prescriptions in blister packs to help with noncompliance due to memory issues. He also had not used his Repatha injections for some time. He has since improved with his compliance and during his most recent cardiology visit 09/04/2021 he had been doing well and his blood pressure readings were well controlled at home.  Today, the patient states that he has been complaint with is medications. Recently his Kcl was stopped at his nephrologist visit, he is on spironolactone. His blood pressure is good  but a little higher than goal today at 140/80. He has been compliant with his Repatha injections. 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  He denies any palpitations, chest pain, shortness of breath, or peripheral edema. No lightheadedness, headaches, syncope, orthopnea, or PND.  Past Medical History:  Diagnosis Date   Abnormal EKG    Bipolar disorder (HCC)    Cardiomegaly    CKD (chronic kidney disease), stage II    Family history of heart disease     H/O medication noncompliance    Hyperlipidemia    Hypertension    Stroke ([place]) 11/07/2015   Tobacco abuse     Past Surgical History:  Procedure Laterality Date   ANKLE FRACTURE SURGERY     NM MYOCAR PERF WALL MOTION  06/28/2011   protocol Bruce, normal perfusion nin all regions, post stress EF 57%,, exercise cap 13METS   TRANSTHORACIC ECHOCARDIOGRAM  06/28/2011   EF=55%, Proximal septal thickening, borderline LA enlargement, boarderline aortic root dialation    Current Medications: Current Meds  Medication Sig   amLODipine (NORVASC) 10 MG tablet Take 1 tablet (10 mg total) by mouth at bedtime.   aspirin EC 81 MG tablet Take 1 tablet (81 mg total) by mouth daily.   atorvastatin (LIPITOR) 80 MG tablet Take 1 tablet (80 mg total) by mouth at bedtime.   carvedilol (COREG) 12.5 MG tablet Take 1 tablet by mouth twice daily, morning and evening   chlorthalidone (HYGROTON) 25 MG tablet Take 1 tablet (25 mg total) by mouth in the morning.   clopidogrel (PLAVIX) 75 MG tablet Take 1 tablet (75 mg total) by mouth in the morning.   Evolocumab (REPATHA SURECLICK) [id number] MG/ML SOAJ Inject 140 mg into the skin every 14 (fourteen) days.   hydrALAZINE (APRESOLINE) 100 MG tablet Take 1 tablet (100 mg total) by mouth 3 (three) times daily.   spironolactone (ALDACTONE) 25 MG tablet Take 25 mg by mouth daily.  Allergies:   Lisinopril   Social History   Socioeconomic History   Marital status: Married    Spouse name: Not on file   Number of children: Not on file   Years of education: Not on file   Highest education level: Not on file  Occupational History   Not on file  Tobacco Use   Smoking status: Former    Packs/day: 0.25    Years: 15.00    Total pack years: 3.75    Types: Cigarettes    Quit date: 11/06/2015    Years since quitting: 6.3   Smokeless tobacco: Never   Tobacco comments:    quit in Juy 2015  Substance and Sexual Activity   Alcohol use: Not Currently    Alcohol/week:  12.0 standard drinks of alcohol    Types: 12 Cans of beer per week    Comment: 12 pack on weekends    Drug use: Yes    Types: Marijuana   Sexual activity: Not on file  Other Topics Concern   Not on file  Social History Narrative   Not on file   Social Determinants of Health   Financial Resource Strain: Not on file  Food Insecurity: Not on file  Transportation Needs: Not on file  Physical Activity: Not on file  Stress: Not on file  Social Connections: Not on file     Family History: The patient's family history includes Asthma in his daughter and son; Diabetes in his father, maternal grandmother, mother, and sister; Hyperlipidemia in his maternal grandmother; Hypertension in his father, mother, and sister; Stroke in his father, mother, and sister.  ROS:   Please see the history of present illness.     All other systems reviewed and are negative.  EKGs/Labs/Other Studies Reviewed:    The following studies were reviewed today:  EKG: The EKG is personally reviewed 03/13/2022: SB, LAD, LVH, poor R wave progression rate 57. 05/08/2019: SB first degree AVB, rate 51, LAE, LAD, iRBBB, LVH with repolarization change.  Recent Labs: 02/19/2022: ALT 211; BUN 23; Creatinine, Ser 2.41; Hemoglobin 15.9; Platelets 202; Potassium 3.0; Sodium 135  Recent Lipid Panel    Component Value Date/Time   CHOL 214 (H) 06/30/2021 1032   TRIG 53 06/30/2021 1032   HDL 47 06/30/2021 1032   CHOLHDL 4.6 06/30/2021 1032   CHOLHDL 4.9 06/07/2018 0452   VLDL 14 06/07/2018 0452   LDLCALC 158 (H) 06/30/2021 1032    Physical Exam:    VS:  BP (!) 140/80   Pulse (!) 57   Ht 6' 3"$  (1.905 m)   Wt 208 lb 9.6 oz (94.6 kg)   SpO2 99%   BMI 26.07 kg/m     Wt Readings from Last 5 Encounters:  03/13/22 208 lb 9.6 oz (94.6 kg)  07/27/21 200 lb (90.7 kg)  07/13/21 206 lb (93.4 kg)  06/29/21 206 lb 9.6 oz (93.7 kg)  01/07/20 217 lb (98.4 kg)     Constitutional: No acute distress Eyes: sclera  non-icteric, normal conjunctiva and lids ENMT: Mask in place Cardiovascular: regular rhythm, normal rate, slight 1/6 systolic murmur. S1 and S2 normal. Radial pulses normal bilaterally. No jugular venous distention.  Respiratory: clear to auscultation bilaterally GI : normal bowel sounds, soft and nontender. No distention.   MSK: extremities warm, well perfused. No edema.  NEURO: grossly nonfocal exam, moves all extremities. PSYCH: alert and oriented x 3, normal mood and affect.    ASSESSMENT:    1. Resistant hypertension  2. Acute cerebrovascular accident (CVA) ([place])   3. Stage 3b chronic kidney disease ([place])   4. Hyperlipidemia LDL goal <70     PLAN:    Resistant hypertension - Plan: EKG 12-Lead  Acute cerebrovascular accident (CVA) ([place])  Stage 3b chronic kidney disease ([place])  Hyperlipidemia LDL goal <70   Continue current antiHTN therapy. Overall doing well compared to prior.   For history of stroke, he takes ASA and plavix, as well as statin.   On repatha for HLD and stroke, needs follow up cholesterol labs. He just had blood work with nephrology, if this was not included he should repeat at our office.    Total time of encounter: 25 minutes total time of encounter, including 15 minutes spent in face-to-face patient care on the date of this encounter. This time includes coordination of care and counseling regarding above mentioned problem list. Remainder of non-face-to-face time involved reviewing chart documents/testing relevant to the patient encounter and documentation in the medical record. I have independently reviewed documentation from referring provider.   Cherlynn Kaiser, MD [place]  [facility]    Medication Adjustments/Labs and Tests Ordered: Current medicines are reviewed at length with the patient today.  Concerns regarding medicines are outlined above.  Orders Placed This Encounter  Procedures   EKG 12-Lead   No orders of the defined types  were placed in this encounter.  Patient Instructions  Medication Instructions:  No Changes In Medications at this time.  *If you need a refill on your cardiac medications before your next appointment, please call your pharmacy*  Follow-Up: At [facility], you and your health needs are our priority.  As part of our continuing mission to provide you with exceptional heart care, we have created designated Provider Care Teams.  These Care Teams include your primary Cardiologist (physician) and Advanced Practice Providers (APPs -  Physician Assistants and Nurse Practitioners) who all work together to provide you with the care you need, when you need it.  Your next appointment:   6 month(s)  Provider:   ANY APP  THEN  Dr. Margaretann Loveless WILL SEE YOU IN 1 YEAR   I,Coren O'Brien,acting as a scribe for Elouise Munroe, MD.,have documented all relevant documentation on the behalf of Elouise Munroe, MD,as directed by  Elouise Munroe, MD while in the presence of Elouise Munroe, MD.  I, Elouise Munroe, MD, have reviewed all documentation for this visit. The documentation on 03/13/22 for the exam, diagnosis, procedures, and orders are all accurate and complete.

## 2022-03-13 NOTE — Patient Instructions (Signed)
Medication Instructions:  No Changes In Medications at this time.  *If you need a refill on your cardiac medications before your next appointment, please call your pharmacy*  Follow-Up: At [facility], you and your health needs are our priority.  As part of our continuing mission to provide you with exceptional heart care, we have created designated Provider Care Teams.  These Care Teams include your primary Cardiologist (physician) and Advanced Practice Providers (APPs -  Physician Assistants and Nurse Practitioners) who all work together to provide you with the care you need, when you need it.  Your next appointment:   6 month(s)  Provider:   ANY APP  THEN  Dr. Margaretann Loveless WILL SEE YOU IN 1 YEAR

## 2022-06-22 LAB — LAB REPORT - SCANNED
Creatinine, POC: 151.3 mg/dL
EGFR: 40

## 2022-06-26 ENCOUNTER — Telehealth: Payer: Self-pay | Admitting: Gastroenterology

## 2022-06-26 NOTE — Telephone Encounter (Signed)
Good Morning Dr Garey Ham MD Am 06/25/22  We received a referral for patient to be seen for Transaminitis. Please review referral to advise if appropriate to schedule patient for a office visit.  Thank you

## 2022-06-27 NOTE — Telephone Encounter (Signed)
Pl schedule in APP clinic RG

## 2022-07-12 ENCOUNTER — Encounter: Payer: Self-pay | Admitting: Nurse Practitioner

## 2022-07-31 ENCOUNTER — Other Ambulatory Visit: Payer: Self-pay | Admitting: Internal Medicine

## 2022-09-11 LAB — LAB REPORT - SCANNED: EGFR: 41

## 2022-09-20 ENCOUNTER — Other Ambulatory Visit (INDEPENDENT_AMBULATORY_CARE_PROVIDER_SITE_OTHER): Payer: Medicaid Other

## 2022-09-20 ENCOUNTER — Ambulatory Visit (INDEPENDENT_AMBULATORY_CARE_PROVIDER_SITE_OTHER): Payer: Medicaid Other | Admitting: Nurse Practitioner

## 2022-09-20 ENCOUNTER — Encounter: Payer: Self-pay | Admitting: Nurse Practitioner

## 2022-09-20 VITALS — BP 120/86 | HR 65 | Ht 75.0 in | Wt 200.0 lb

## 2022-09-20 DIAGNOSIS — R7989 Other specified abnormal findings of blood chemistry: Secondary | ICD-10-CM

## 2022-09-20 LAB — COMPREHENSIVE METABOLIC PANEL
ALT: 63 U/L — ABNORMAL HIGH (ref 0–53)
AST: 42 U/L — ABNORMAL HIGH (ref 0–37)
Albumin: 4.4 g/dL (ref 3.5–5.2)
Alkaline Phosphatase: 72 U/L (ref 39–117)
BUN: 19 mg/dL (ref 6–23)
CO2: 25 meq/L (ref 19–32)
Calcium: 10.1 mg/dL (ref 8.4–10.5)
Chloride: 105 meq/L (ref 96–112)
Creatinine, Ser: 2.01 mg/dL — ABNORMAL HIGH (ref 0.40–1.50)
GFR: 36.45 mL/min — ABNORMAL LOW (ref >60.00)
Glucose, Bld: 86 mg/dL (ref 70–99)
Potassium: 3.8 meq/L (ref 3.5–5.1)
Sodium: 137 meq/L (ref 135–145)
Total Bilirubin: 1.2 mg/dL (ref 0.2–1.2)
Total Protein: 8.2 g/dL (ref 6.0–8.3)

## 2022-09-20 LAB — CBC WITH DIFFERENTIAL/PLATELET
Basophils Absolute: 0 10*3/uL (ref 0.0–0.1)
Basophils Relative: 0.8 % (ref 0.0–3.0)
Eosinophils Absolute: 0.1 10*3/uL (ref 0.0–0.7)
Eosinophils Relative: 2.1 % (ref 0.0–5.0)
HCT: 43.3 % (ref 39.0–52.0)
Hemoglobin: 14.2 g/dL (ref 13.0–17.0)
Lymphocytes Relative: 41.6 % (ref 12.0–46.0)
Lymphs Abs: 1.7 10*3/uL (ref 0.7–4.0)
MCHC: 32.9 g/dL (ref 30.0–36.0)
MCV: 86 fl (ref 78.0–100.0)
Monocytes Absolute: 0.7 10*3/uL (ref 0.1–1.0)
Monocytes Relative: 17.6 % — ABNORMAL HIGH (ref 3.0–12.0)
Neutro Abs: 1.6 10*3/uL (ref 1.4–7.7)
Neutrophils Relative %: 37.9 % — ABNORMAL LOW (ref 43.0–77.0)
Platelets: 194 10*3/uL (ref 150.0–400.0)
RBC: 5.03 Mil/uL (ref 4.22–5.81)
RDW: 15.1 % (ref 11.5–15.5)
WBC: 4.2 10*3/uL (ref 4.0–10.5)

## 2022-09-20 LAB — IRON: Iron: 150 ug/dL (ref 42–165)

## 2022-09-20 NOTE — Patient Instructions (Signed)
Your provider has requested that you go to the basement level for lab work before leaving today. Press "B" on the elevator. The lab is located at the first door on the left as you exit the elevator.  You have been scheduled for an abdominal ultrasound at [facility] (1st floor of hospital) on 09/27/22 at 10:00 am. Please arrive 30 minutes prior to your appointment for registration. Make certain not to have anything to eat or drink 6 hours prior to your appointment. Should you need to reschedule your appointment, please contact radiology at [phone]. This test typically takes about 30 minutes to perform.  Due to recent changes in healthcare laws, you may see the results of your imaging and laboratory studies on MyChart before your provider has had a chance to review them.  We understand that in some cases there may be results that are confusing or concerning to you. Not all laboratory results come back in the same time frame and the provider may be waiting for multiple results in order to interpret others.  Please give [place] 48 hours in order for your provider to thoroughly review all the results before contacting the office for clarification of your results.   Thank you for trusting me with your gastrointestinal care!   Alcide Evener, CRNP

## 2022-09-20 NOTE — Progress Notes (Signed)
09/20/2022 Thomas [name] [id number] [birth year]   CHIEF COMPLAINT: Elevated LFTs  HISTORY OF PRESENT ILLNESS:  Thomas [name] is a 57 year old male with a past medical history of hypertension, hyperlipidemia, cardiomegaly, CVA 05/2018, CKD, bipolar disorder, alcohol use disorder (abstinent x 8 years) and chronic marijuana use. He presents to our office today as referred by Dr. Vallery Sa for further evaluation regarding elevated LFTs. He is accompanied by his wife and his daughter who is a Designer, jewellery is on speaker phone. His liver enzymes were notably elevated when he went to the ED 02/19/2022 due to dizziness. At that time, his AST level was 122, ALT 211 with normal total bili and alk phos levels.  He left the ED prior to being seen by the ED physician. He was seen by his Dr. Malen Gauze a few days later she held his Chlorthalidone and Atorvastatin for 2 weeks. Repeat laboratory studies 03/12/2022 showed rising LFTs with AST 204 and ALT 316.  Dr. Malen Gauze has been monitoring his LFTs, see lab summary below.     He denies any known history of liver disease.  No family history of liver disease.  No nausea or vomiting.  No heartburn or dysphagia.  No abdominal pain.  He passes a normal brown bowel movement daily.  No rectal bleeding or black stools.  Never had a screening colonoscopy.  He denies having any confusion.  His daughter stated he has mild memory issues since his CVA.  He denies having any chest pain or shortness of breath.      Latest Ref Rng & Units 02/19/2022    3:35 PM 06/30/2021   10:32 AM 11/27/2019   10:52 AM  CBC  WBC 4.0 - 10.5 K/uL 4.3  4.4  3.7   Hemoglobin 13.0 - 17.0 g/dL 78.2  95.6  21.3   Hematocrit 39.0 - 52.0 % 47.1  48.2  46.9   Platelets 150 - 400 K/uL 202  202  197         Latest Ref Rng & Units 02/19/2022    3:35 PM 07/27/2021    4:23 PM 06/30/2021   10:32 AM  CMP  Glucose 70 - 99 mg/dL 086  83  85   BUN 6 - 20 mg/dL 23  26  20    Creatinine 0.61 - 1.24  mg/dL 5.78  4.69  6.29   Sodium 135 - 145 mmol/L 135  141  139   Potassium 3.5 - 5.1 mmol/L 3.0  3.2  3.7   Chloride 98 - 111 mmol/L 97  101  101   CO2 22 - 32 mmol/L 29  27  25    Calcium 8.9 - 10.3 mg/dL 9.3  9.6  52.8   Total Protein 6.5 - 8.1 g/dL 7.9     Total Bilirubin 0.3 - 1.2 mg/dL 1.2     Alkaline Phos 38 - 126 U/L 66     AST 15 - 41 U/L 122     ALT 0 - 44 U/L 211       ECHO 07/12/2021: Left ventricular ejection fraction, by estimation, is 60 to 65%. The left ventricle has normal function. The left ventricle has no regional wall motion abnormalities. There is moderate concentric left ventricular hypertrophy. Left ventricular diastolic parameters are indeterminate. The average left ventricular global longitudinal strain is -16.5 %. The global longitudinal strain is normal. 1. Right ventricular systolic function is normal. The right ventricular size is normal. Tricuspid regurgitation signal is  inadequate for assessing PA pressure. 2. 3. Left atrial size was mild to moderately dilated. The mitral valve is grossly normal. Trivial mitral valve regurgitation. No evidence of mitral stenosis. 4. The aortic valve is tricuspid. There is mild calcification of the aortic valve. There is mild thickening of the aortic valve. Aortic valve regurgitation is mild. Aortic valve sclerosis is present, with no evidence of aortic valve stenosis. Aortic regurgitation PHT measures 471 msec. 5. 6. Aortic dilatation noted. There is mild dilatation of the aortic root, measuring 43 mm. The inferior vena cava is normal in size with greater than 50% respiratory variability, suggesting right atrial pressure of 3 mmHg.  Past Medical History:  Diagnosis Date   Abnormal EKG    Bipolar disorder (HCC)    Cardiomegaly    CKD (chronic kidney disease), stage II    Family history of heart disease    H/O medication noncompliance    Hyperlipidemia    Hypertension    Stroke (HCC) 11/07/2015   Tobacco abuse    Past Surgical  History:  Procedure Laterality Date   ANKLE FRACTURE SURGERY     NM MYOCAR PERF WALL MOTION  06/28/2011   protocol Bruce, normal perfusion nin all regions, post stress EF 57%,, exercise cap   TRANSTHORACIC ECHOCARDIOGRAM  06/28/2011   EF=55%, Proximal septal thickening, borderline LA enlargement, boarderline aortic root dialation   Social History: He is married.  He has 4 children, daughter is a Designer, jewellery.  Past smoker, quit smoking cigarettes 6 years ago.  He smokes marijuana twice daily x 8 years.  Urine drug screen positive for cocaine 05/2018.  Family History: family history includes Asthma in his daughter and son; Diabetes in his father, maternal grandmother, mother, and sister; Hyperlipidemia in his maternal grandmother; Hypertension in his father, mother, and sister; Stroke in his father, mother, and sister.  No known family history of liver disease or GI cancer.  Allergies  Allergen Reactions   Lisinopril Swelling    Facial swelling      Outpatient Encounter Medications as of 09/20/2022  Medication Sig   amLODipine (NORVASC) 10 MG tablet Take 1 tablet (10 mg total) by mouth at bedtime.   aspirin EC 81 MG tablet Take 1 tablet (81 mg total) by mouth daily.   atorvastatin (LIPITOR) 80 MG tablet Take 1 tablet (80 mg total) by mouth at bedtime.   carvedilol (COREG) 12.5 MG tablet Take 1 tablet by mouth twice daily, morning and evening   chlorthalidone (HYGROTON) 25 MG tablet Take 1 tablet (25 mg total) by mouth in the morning.   clopidogrel (PLAVIX) 75 MG tablet Take 1 tablet (75 mg total) by mouth in the morning.   Evolocumab (REPATHA SURECLICK) 140 MG/ML SOAJ Inject 140 mg into the skin every 14 (fourteen) days.   hydrALAZINE (APRESOLINE) 100 MG tablet Take 1 tablet (100 mg total) by mouth 3 (three) times daily.   spironolactone (ALDACTONE) 25 MG tablet Take 25 mg by mouth daily.   No facility-administered encounter medications on file as of 09/20/2022.   REVIEW OF  SYSTEMS:  Gen: Denies fever, sweats or chills. No weight loss.  CV: Denies chest pain, palpitations or edema. Resp: Denies cough, shortness of breath of hemoptysis.  GI: See HPI.   GU: Denies urinary burning, blood in urine, increased urinary frequency or incontinence. MS: Denies joint pain, muscles aches or weakness. Derm: Denies rash, itchiness, skin lesions or unhealing ulcers. Psych: + Memory impairment.   Heme: Denies bruising, easy bleeding.  Neuro:  Denies headaches, dizziness or paresthesias. Endo:  Denies any problems with DM, thyroid or adrenal function.  PHYSICAL EXAM: BP 120/86   Pulse 65   Ht 6\' 3"  (1.905 m)   Wt 200 lb (90.7 kg)   BMI 25.00 kg/m  General: 56 year old male in no acute distress. Head: Normocephalic and atraumatic. Eyes:  Sclerae non-icteric, conjunctive pink. Ears: Normal auditory acuity. Mouth: Dentition intact. No ulcers or lesions.  Neck: Supple, no lymphadenopathy or thyromegaly.  Lungs: Clear bilaterally to auscultation without wheezes, crackles or rhonchi. Heart: Regular rate and rhythm. No murmur, rub or gallop appreciated.  Abdomen: Soft, nontender, nondistended. No masses. No hepatosplenomegaly. Normoactive bowel sounds x 4 quadrants.  Rectal: Deferred.  Musculoskeletal: Symmetrical with no gross deformities. Skin: Warm and dry. No rash or lesions on visible extremities. Multiple tattoos. Extremities: No edema. Neurological: Alert oriented x 4, no focal deficits.  Psychological:  Alert and cooperative. Normal mood and affect.  ASSESSMENT AND PLAN:  57 year old male with elevated AST/ALT levels.  AST 122 -> 204 -> 54. ALT 211 -> 316 -> 81. Normal T. Bili and Alk phos levels. Atorvastatin was held for 2 weeks without improvement therefore was restarted.  -RUQ sonogram  -CBC, CMP, hepatitis A total antibody, hepatitis B surface antigen, hepatitis B surface antibody, hepatitis B core total antibody, hepatitis C antibody, ANA, SMA, AMA, IgG,  iron, ferritin, ceruloplasmin and alpha-1 antitrypsin level -Further recommendations to be determined after the above evaluation completed Patient to follow-up in 8 weeks  History of alcohol use disorder, abstinent from alcohol x 8 years  History of CVA 05/2018 on Plavix and atorvastatin  CKD stage IIIb  Colon cancer screening.  Patient never had a colonoscopy. Patient to follow-up in 2 months to discuss colon cancer screening options    CC:  Fleet Contras, MD

## 2022-09-21 ENCOUNTER — Other Ambulatory Visit: Payer: Self-pay | Admitting: Internal Medicine

## 2022-09-21 DIAGNOSIS — I639 Cerebral infarction, unspecified: Secondary | ICD-10-CM

## 2022-09-21 DIAGNOSIS — E7849 Other hyperlipidemia: Secondary | ICD-10-CM

## 2022-09-21 LAB — FERRITIN: Ferritin: 244.1 ng/mL (ref 22.0–322.0)

## 2022-09-25 ENCOUNTER — Other Ambulatory Visit: Payer: Self-pay

## 2022-09-25 ENCOUNTER — Other Ambulatory Visit: Payer: Medicaid Other

## 2022-09-25 DIAGNOSIS — R7989 Other specified abnormal findings of blood chemistry: Secondary | ICD-10-CM

## 2022-09-25 LAB — ANTI-SMOOTH MUSCLE ANTIBODY, IGG: Actin (Smooth Muscle) Antibody (IGG): <20 U (ref <20)

## 2022-09-25 LAB — CERULOPLASMIN: Ceruloplasmin: 30 mg/dL (ref 14–30)

## 2022-09-25 LAB — MITOCHONDRIAL ANTIBODIES: Mitochondrial M2 Ab, IgG: <=20 U (ref <=20.0)

## 2022-09-25 LAB — ALPHA-1-ANTITRYPSIN: A-1 Antitrypsin, Ser: 175 mg/dL (ref 83–199)

## 2022-09-25 LAB — IGG: IgG (Immunoglobin G), Serum: 2049 mg/dL — ABNORMAL HIGH (ref 600–1640)

## 2022-09-25 LAB — ANTI-NUCLEAR AB-TITER (ANA TITER): ANA Titer 1: 1:40 {titer} — ABNORMAL HIGH

## 2022-09-25 LAB — HEPATITIS B SURFACE ANTIGEN: Hepatitis B Surface Ag: REACTIVE — AB

## 2022-09-25 LAB — ANA: Anti Nuclear Antibody (ANA): POSITIVE — AB

## 2022-09-25 LAB — HEPATITIS A ANTIBODY, TOTAL: Hepatitis A AB,Total: NONREACTIVE

## 2022-09-25 LAB — HEPATITIS C ANTIBODY: Hepatitis C Ab: NONREACTIVE

## 2022-09-25 LAB — HEPATITIS B SURFACE ANTIBODY,QUALITATIVE: Hep B S Ab: REACTIVE — AB

## 2022-09-25 LAB — HEPATITIS B CORE ANTIBODY, TOTAL: Hep B Core Total Ab: REACTIVE — AB

## 2022-09-25 NOTE — Progress Notes (Signed)
Agree with the assessment and plan as outlined by Colleen Kennedy-Smith, NP.    Scott E. Cunningham, MD [facility]  

## 2022-09-27 ENCOUNTER — Ambulatory Visit (HOSPITAL_COMMUNITY)
Admission: RE | Admit: 2022-09-27 | Discharge: 2022-09-27 | Disposition: A | Discharge status: Home / Self Care | Payer: Medicaid Other | Source: Ambulatory Visit | Attending: Nurse Practitioner | Admitting: Nurse Practitioner

## 2022-09-27 DIAGNOSIS — R7989 Other specified abnormal findings of blood chemistry: Secondary | ICD-10-CM | POA: Insufficient documentation

## 2022-09-27 LAB — AFP TUMOR MARKER: AFP-Tumor Marker: 2 ng/mL (ref <6.1)

## 2022-09-27 LAB — HEPATITIS B E ANTIBODY: Hep B E Ab: REACTIVE — AB

## 2022-09-27 LAB — HEPATITIS B DNA, ULTRAQUANTITATIVE, PCR
Hepatitis B DNA: 431000000 [IU]/mL — ABNORMAL HIGH
Hepatitis B virus DNA: 8.63 {Log_IU}/mL — ABNORMAL HIGH

## 2022-09-27 LAB — HEPATITIS B CORE ANTIBODY, IGM: Hep B C IgM: REACTIVE — AB

## 2022-09-27 LAB — HEPATITIS B E ANTIGEN: Hep B E Ag: NONREACTIVE

## 2022-09-28 ENCOUNTER — Other Ambulatory Visit: Payer: Self-pay

## 2022-09-28 DIAGNOSIS — B1910 Unspecified viral hepatitis B without hepatic coma: Secondary | ICD-10-CM

## 2022-10-02 ENCOUNTER — Telehealth: Payer: Self-pay | Admitting: Internal Medicine

## 2022-10-02 ENCOUNTER — Other Ambulatory Visit: Payer: Self-pay

## 2022-10-02 ENCOUNTER — Other Ambulatory Visit (HOSPITAL_COMMUNITY): Payer: Self-pay

## 2022-10-02 ENCOUNTER — Ambulatory Visit (INDEPENDENT_AMBULATORY_CARE_PROVIDER_SITE_OTHER): Payer: Medicaid Other | Admitting: Family

## 2022-10-02 ENCOUNTER — Telehealth: Payer: Self-pay | Admitting: Pharmacy Technician

## 2022-10-02 ENCOUNTER — Encounter: Payer: Self-pay | Admitting: Family

## 2022-10-02 VITALS — BP 118/74 | HR 60 | Temp 98.3°F | Ht 75.0 in | Wt 199.0 lb

## 2022-10-02 DIAGNOSIS — B181 Chronic viral hepatitis B without delta-agent: Secondary | ICD-10-CM | POA: Diagnosis present

## 2022-10-02 MED ORDER — ATORVASTATIN CALCIUM 80 MG PO TABS: 80.0000 mg | ORAL_TABLET | Freq: Every day | ORAL | 1 refills | Status: DC

## 2022-10-02 MED ORDER — ENTECAVIR 0.5 MG PO TABS: 0.5000 mg | ORAL_TABLET | Freq: Every day | ORAL | 5 refills | Status: DC

## 2022-10-02 MED ORDER — HYDRALAZINE HCL 100 MG PO TABS: 100.0000 mg | ORAL_TABLET | Freq: Three times a day (TID) | ORAL | 1 refills | Status: DC

## 2022-10-02 MED ORDER — CARVEDILOL 12.5 MG PO TABS: ORAL_TABLET | ORAL | 1 refills | Status: DC

## 2022-10-02 MED ORDER — CLOPIDOGREL BISULFATE 75 MG PO TABS: 75.0000 mg | ORAL_TABLET | Freq: Every morning | ORAL | 1 refills | Status: DC

## 2022-10-02 NOTE — Progress Notes (Signed)
Subjective:    Patient ID: Thomas [name], male    DOB: 09-01-1965, 57 y.o.   MRN: [id number]  Chief Complaint  Patient presents with   New Patient (Initial Visit)    Hepatitis B    HPI:  Thomas [name] is a 57 y.o. male with previous medical history of hypertension, cardiomegaly,CVA, chronic kidney disease Stage 2, and bipolar disorder presenting today for Hepatitis B.   Thomas [name] was recently referred to Gastroenterology for elevated liver function tests on 06/21/22 with AST 54 and ALT 81 and 03/12/22 AST 204 and ALT 316. Was seen in the ED on 02/19/22 with dizziness with elevated LFTs of AST 122 and ALT 211. [place] RUQ showed no cholelithiasis and normal liver parenchymal echogenicity. AST was 42 and ALT 63 with Hepatitis screening resulting with a negative Hepatitis A and Hepatitis C. Hepatitis B Surface antigen, Hepatitis B Surface antibody, Core IgM, Core antibody and Hepatitis B E antibody were all positive. Hepatitis B E antigen was negative. Hepatitis B DNA level was 431 million.   Thomas [name] has been in good health overall with no abdominal symptoms and denies abdominal pain, nausea, vomiting, fatigue, fever, scleral icterus or jaundice. Received mixed communications regarding initial diagnosis in the past and is unclear when he may have acquired Hepatitis B. Denies any bloodborne pathogen exposures. Wife has been tested and is positive as well. 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   Allergies  Allergen Reactions   Lisinopril Swelling    Facial swelling      Outpatient Medications Prior to Visit  Medication Sig Dispense Refill   amLODipine (NORVASC) 10 MG tablet Take 1 tablet (10 mg total) by mouth at bedtime. 90 tablet 3   aspirin EC 81 MG tablet Take 1 tablet (81 mg total) by mouth daily. 30 tablet 1   atorvastatin (LIPITOR) 80 MG tablet Take 1 tablet (80 mg total) by mouth at bedtime. 90 tablet 1   carvedilol (COREG) 12.5 MG tablet Take 1 tablet by  mouth twice daily, morning and evening 180 tablet 1   chlorthalidone (HYGROTON) 25 MG tablet Take 1 tablet (25 mg total) by mouth in the morning. 90 tablet 3   clopidogrel (PLAVIX) 75 MG tablet Take 1 tablet (75 mg total) by mouth in the morning. 90 tablet 1   Evolocumab (REPATHA SURECLICK) 140 MG/ML SOAJ Inject 140 mg into the skin every 14 (fourteen) days. 6 mL 3   hydrALAZINE (APRESOLINE) 100 MG tablet Take 1 tablet (100 mg total) by mouth 3 (three) times daily. 270 tablet 1   spironolactone (ALDACTONE) 25 MG tablet Take 25 mg by mouth daily.     Vitamin D, Ergocalciferol, (DRISDOL) 1.25 MG (50000 UNIT) CAPS capsule Take 50,000 Units by mouth once a week.     No facility-administered medications prior to visit.     Past Medical History:  Diagnosis Date   Abnormal EKG    Bipolar disorder (HCC)    Cardiomegaly    CKD (chronic kidney disease), stage II    Family history of heart disease    H/O medication noncompliance    Hyperlipidemia    Hypertension    Stroke (HCC) 11/07/2015   Tobacco abuse      Past Surgical History:  Procedure Laterality Date   ANKLE FRACTURE SURGERY     NM MYOCAR PERF WALL MOTION  06/28/2011   protocol Bruce, normal perfusion nin all regions, post stress EF 57%,, exercise cap  TRANSTHORACIC ECHOCARDIOGRAM  06/28/2011   EF=55%, Proximal septal thickening, borderline LA enlargement, boarderline aortic root dialation       Review of Systems  Constitutional:  Negative for chills, diaphoresis, fatigue and fever.  Respiratory:  Negative for cough, chest tightness, shortness of breath and wheezing.   Cardiovascular:  Negative for chest pain.  Gastrointestinal:  Negative for abdominal distention, abdominal pain, constipation, diarrhea, nausea and vomiting.  Neurological:  Negative for weakness and headaches.  Hematological:  Does not bruise/bleed easily.      Objective:    BP 118/74   Pulse 60   Temp 98.3 F (36.8 C) (Temporal)   Ht 6\' 3"   (1.905 m)   Wt 199 lb (90.3 kg)   SpO2 97%   BMI 24.87 kg/m  Nursing note and vital signs reviewed.  Physical Exam Constitutional:      General: He is not in acute distress.    Appearance: He is well-developed.  Cardiovascular:     Rate and Rhythm: Normal rate and regular rhythm.     Heart sounds: Normal heart sounds. No murmur heard.    No friction rub. No gallop.  Pulmonary:     Effort: Pulmonary effort is normal. No respiratory distress.     Breath sounds: Normal breath sounds. No wheezing or rales.  Chest:     Chest wall: No tenderness.  Abdominal:     General: Bowel sounds are normal. There is no distension.     Palpations: Abdomen is soft. There is no mass.     Tenderness: There is no abdominal tenderness. There is no guarding or rebound.  Skin:    General: Skin is warm and dry.  Neurological:     Mental Status: He is alert and oriented to person, place, and time.  Psychiatric:        Behavior: Behavior normal.        Thought Content: Thought content normal.        Judgment: Judgment normal.         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 and without coma (HCC) 10/02/2022   Cerebral thrombosis with cerebral infarction 06/07/2018   Accelerated hypertension 06/06/2018   Stroke (cerebrum) (HCC) 08/29/2017   Stroke (HCC) 08/29/2017   CKD (chronic kidney disease), stage II    Bipolar disorder (HCC)    Altered mental status 02/24/2016   Urinary tract infection without hematuria 02/24/2016   Acute encephalopathy 02/24/2016   Chest pain 02/24/2016   History of stroke    Blurry vision, right eye 11/08/2015   Acute cerebrovascular accident (CVA) (HCC) 11/08/2015   Acute kidney injury superimposed on CKD (HCC) 11/07/2015   Hyperlipidemia 01/26/2014    TOBACCO ABUSE 04/19/2008   HYPERTENSION, BENIGN ESSENTIAL 01/22/2005     Problem List Items Addressed This Visit       Digestive   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 Suspect this is a chronic infection that has been reactivated from an unclear cause as it does not appear to be a new acute infection based on his current history. DNA level was 431 million  and ALT has been elevated >2 the upper limits of normal and with Hepatitis B E antibody positive discussed the recommendation for treatment. Ultrasound reviewed and no evidence of cirrhosis. Discussed the basics of Hepatitis B including transmission, risk if left untreated, lab work, treatment options including side effects, and plan of care. Start entecavir secondary to renal function to avoid tenofovir for now. Check lab work including Hepatitis D antibody. Plan for follow up in 6 weeks or sooner if needed with lab work on the same day.        Relevant Medications   entecavir (BARACLUDE) 0.5 MG tablet   Other Relevant Orders   CBC   Hepatic function panel   Hepatitis B core antibody, IgM   Hepatitis B core antibody, total   Hepatitis B DNA, ultraquantitative, PCR   Hepatitis B e antibody   Hepatitis B e antigen   Hepatitis B surface antibody,qualitative   Hepatitis B surface antigen   Hepatitis delta antibody     I am having Thomas [name] start on entecavir. I am also having him maintain his aspirin EC, chlorthalidone, amLODipine, spironolactone, Repatha SureClick, atorvastatin, carvedilol, clopidogrel, hydrALAZINE, and Vitamin D (Ergocalciferol).   Meds ordered this encounter  Medications   entecavir (BARACLUDE) 0.5 MG tablet    Sig: Take 1 tablet (0.5 mg total) by mouth daily.    Dispense:  30 tablet    Refill:  5    Order Specific Question:   Supervising Provider    Answer:   Judyann Munson [[id number]]     Follow-up: Return  in about 6 weeks (around 11/13/2022). or sooner if needed.   Thomas Eke, MSN, FNP-C Nurse Practitioner [facility] Main number: [phone]

## 2022-10-02 NOTE — Patient Instructions (Addendum)
Nice to see you.  We will check your lab work today.  Continue to take your medication daily as prescribed.  Refills have been sent to the pharmacy.  Plan for follow up in 6 weeks or sooner if needed with lab work on the same day.  Have a great day and stay safe!

## 2022-10-02 NOTE — Telephone Encounter (Signed)
*  STAT* If patient is at the pharmacy, call can be transferred to refill team.   1. Which medications need to be refilled? (please list name of each medication and dose if known)   atorvastatin (LIPITOR) 80 MG tablet  clopidogrel (PLAVIX) 75 MG tablet  carvedilol (COREG) 12.5 MG tablet  hydrALAZINE (APRESOLINE) 100 MG tablet   2. Would you like to learn more about the convenience, safety, & potential cost savings by using the [facility]?   3. Are you open to using the [facility] (Type [facility]. ).  4. Which pharmacy/location (including street and city if local pharmacy) is medication to be sent to?  [facility] - [place], [place] - [address]   5. Do they need a 30 day or 90 day supply?   30 day  Caller [facility]) stated patient has been out of these medications since 8/9.

## 2022-10-02 NOTE — Telephone Encounter (Signed)
Pt's medications were sent to pt's pharmacy as requested. Confirmation received.  

## 2022-10-02 NOTE — Assessment & Plan Note (Signed)
Mr. Stroud is a 57 y/o AA male with Hepatitis B. Suspect this is a chronic infection that has been reactivated from an unclear cause as it does not appear to be a new acute infection based on his current history. DNA level was 431 million and ALT has been elevated >2 the upper limits of normal and with Hepatitis B E antibody positive discussed the recommendation for treatment. Ultrasound reviewed and no evidence of cirrhosis. Discussed the basics of Hepatitis B including transmission, risk if left untreated, lab work, treatment options including side effects, and plan of care. Start entecavir secondary to renal function to avoid tenofovir for now. Check lab work including Hepatitis D antibody. Plan for follow up in 6 weeks or sooner if needed with lab work on the same day.

## 2022-10-02 NOTE — Telephone Encounter (Signed)
[facility] Pharmacy Patient Advocate Encounter  Insurance verification completed.    The patient is insured through [employer].     Ran test claim for Vemlidy Medication will need a PA. - other meds should be covered. We will continue to follow to see if copay assistance is needed.  This test claim was processed through [facility]- copay amounts may vary at other pharmacies due to pharmacy/plan contracts, or as the patient moves through the different stages of their insurance plan.

## 2022-10-04 ENCOUNTER — Ambulatory Visit: Payer: Medicaid Other | Admitting: Family

## 2022-10-05 LAB — CBC
HCT: 39.7 % (ref 38.5–50.0)
Hemoglobin: 13.3 g/dL (ref 13.2–17.1)
MCH: 28.8 pg (ref 27.0–33.0)
MCHC: 33.5 g/dL (ref 32.0–36.0)
MCV: 85.9 fL (ref 80.0–100.0)
MPV: 10.6 fL (ref 7.5–12.5)
Platelets: 228 10*3/uL (ref 140–400)
RBC: 4.62 10*6/uL (ref 4.20–5.80)
RDW: 14.6 % (ref 11.0–15.0)
WBC: 4.3 10*3/uL (ref 3.8–10.8)

## 2022-10-05 LAB — HEPATIC FUNCTION PANEL
AG Ratio: 1.4 (calc) (ref 1.0–2.5)
ALT: 57 U/L — ABNORMAL HIGH (ref 9–46)
AST: 44 U/L — ABNORMAL HIGH (ref 10–35)
Albumin: 4.4 g/dL (ref 3.6–5.1)
Alkaline phosphatase (APISO): 70 U/L (ref 35–144)
Bilirubin, Direct: 0.4 mg/dL — ABNORMAL HIGH (ref 0.0–0.2)
Globulin: 3.2 g/dL (ref 1.9–3.7)
Indirect Bilirubin: 0.7 mg/dL (ref 0.2–1.2)
Total Bilirubin: 1.1 mg/dL (ref 0.2–1.2)
Total Protein: 7.6 g/dL (ref 6.1–8.1)

## 2022-10-05 LAB — HEPATITIS B SURFACE ANTIGEN: Hepatitis B Surface Ag: REACTIVE — AB

## 2022-10-05 LAB — HEPATITIS B CORE ANTIBODY, IGM: Hep B C IgM: REACTIVE — AB

## 2022-10-05 LAB — HEPATITIS B DNA, ULTRAQUANTITATIVE, PCR
Hepatitis B DNA: 204000000 [IU]/mL — ABNORMAL HIGH
Hepatitis B virus DNA: 8.31 {Log_IU}/mL — ABNORMAL HIGH

## 2022-10-05 LAB — HEPATITIS B E ANTIGEN: Hep B E Ag: NONREACTIVE

## 2022-10-05 LAB — HEPATITIS B CORE ANTIBODY, TOTAL: Hep B Core Total Ab: REACTIVE — AB

## 2022-10-05 LAB — HEPATITIS B E ANTIBODY: Hep B E Ab: REACTIVE — AB

## 2022-10-05 LAB — HEPATITIS B SURFACE ANTIBODY,QUALITATIVE: Hep B S Ab: REACTIVE — AB

## 2022-10-05 LAB — HEPATITIS DELTA ANTIBODY: Hepatitis D Ab, Total: NEGATIVE

## 2022-11-26 ENCOUNTER — Encounter: Payer: Self-pay | Admitting: Internal Medicine

## 2022-11-26 DIAGNOSIS — E7849 Other hyperlipidemia: Secondary | ICD-10-CM

## 2022-11-26 DIAGNOSIS — I639 Cerebral infarction, unspecified: Secondary | ICD-10-CM

## 2022-11-26 MED ORDER — REPATHA SURECLICK 140 MG/ML ~~LOC~~ SOAJ
140.0000 mg | SUBCUTANEOUS | 3 refills | Status: DC
Start: 2022-11-26 — End: 2022-12-04

## 2022-12-04 ENCOUNTER — Telehealth: Payer: Self-pay | Admitting: Pharmacist Clinician (PhC)/ Clinical Pharmacy Specialist

## 2022-12-04 ENCOUNTER — Encounter: Payer: Self-pay | Admitting: Nurse Practitioner

## 2022-12-04 ENCOUNTER — Other Ambulatory Visit (INDEPENDENT_AMBULATORY_CARE_PROVIDER_SITE_OTHER): Payer: Medicaid Other

## 2022-12-04 ENCOUNTER — Ambulatory Visit (INDEPENDENT_AMBULATORY_CARE_PROVIDER_SITE_OTHER): Payer: Medicaid Other | Admitting: Nurse Practitioner

## 2022-12-04 VITALS — BP 124/70 | HR 53 | Ht 75.0 in | Wt 203.0 lb

## 2022-12-04 DIAGNOSIS — B181 Chronic viral hepatitis B without delta-agent: Secondary | ICD-10-CM

## 2022-12-04 DIAGNOSIS — E7849 Other hyperlipidemia: Secondary | ICD-10-CM

## 2022-12-04 DIAGNOSIS — Z1211 Encounter for screening for malignant neoplasm of colon: Secondary | ICD-10-CM

## 2022-12-04 LAB — HEPATIC FUNCTION PANEL
ALT: 93 U/L — ABNORMAL HIGH (ref 0–53)
AST: 60 U/L — ABNORMAL HIGH (ref 0–37)
Albumin: 4.5 g/dL (ref 3.5–5.2)
Alkaline Phosphatase: 59 U/L (ref 39–117)
Bilirubin, Direct: 0.2 mg/dL (ref 0.0–0.3)
Total Bilirubin: 1 mg/dL (ref 0.2–1.2)
Total Protein: 8.2 g/dL (ref 6.0–8.3)

## 2022-12-04 LAB — BASIC METABOLIC PANEL
BUN: 18 mg/dL (ref 6–23)
CO2: 25 meq/L (ref 19–32)
Calcium: 10.1 mg/dL (ref 8.4–10.5)
Chloride: 103 meq/L (ref 96–112)
Creatinine, Ser: 1.99 mg/dL — ABNORMAL HIGH (ref 0.40–1.50)
GFR: 36.84 mL/min — ABNORMAL LOW (ref >60.00)
Glucose, Bld: 82 mg/dL (ref 70–99)
Potassium: 3.7 meq/L (ref 3.5–5.1)
Sodium: 138 meq/L (ref 135–145)

## 2022-12-04 MED ORDER — REPATHA SURECLICK 140 MG/ML ~~LOC~~ SOAJ: 140.0000 mg | SUBCUTANEOUS | 3 refills | Status: DC

## 2022-12-04 NOTE — Patient Instructions (Addendum)
Your provider has requested that you go to the basement level for lab work before leaving today. Press "B" on the elevator. The lab is located at the first door on the left as you exit the elevator.  Your provider has ordered Cologuard testing as an option for colon cancer screening. This is performed by [employer] and may be out of network with your insurance. PRIOR to completing the test, it is YOUR responsibility to contact your insurance about covered benefits for this test. Your out of pocket expense could be anywhere from $0.00 to $649.00.   When you call to check coverage with your insurer, please provide the following information:   -The ONLY provider of Cologuard is [facility], dispensing  - CPT code for Cologuard is [id number].  [employer] NPI # [id number]  -[employer] Tax ID # [id number]   We have already sent your demographic and insurance information to [employer] (phone number [phone]) and they should contact you within the next week regarding your test. If you have not heard from them within the next week, please call our office at [phone].  Follow up with your infectious disease provider.  Due to recent changes in healthcare laws, you may see the results of your imaging and laboratory studies on MyChart before your provider has had a chance to review them.  We understand that in some cases there may be results that are confusing or concerning to you. Not all laboratory results come back in the same time frame and the provider may be waiting for multiple results in order to interpret others.  Please give [place] 48 hours in order for your provider to thoroughly review all the results before contacting the office for clarification of your results.   Thank you for trusting me with your gastrointestinal care!   Alcide Evener, CRNP

## 2022-12-04 NOTE — Addendum Note (Signed)
Addended by: Rosalee Kaufman on: 12/04/2022 03:09 PM   Modules accepted: Orders

## 2022-12-04 NOTE — Progress Notes (Unsigned)
     12/04/2022 KENDREW SPIRKO [id number] Jun 14, 1965   Chief Complaint:  History of Present Illness: Thomas [name]. Vincente Liberty is a 57 year old male with a past medical history of hypertension, hyperlipidemia, cardiomegaly, CVA 05/2018, CKD, bipolar disorder, alcohol use disorder (abstinent x 8 years) and chronic marijuana use.   Referred to ID for Hep B treatment.     Current Medications, Allergies, Past Medical History, Past Surgical History, Family History and Social History were reviewed in [employer] record.   Review of Systems:   Constitutional: Negative for fever, sweats, chills or weight loss.  Respiratory: Negative for shortness of breath.   Cardiovascular: Negative for chest pain, palpitations and leg swelling.  Gastrointestinal: See HPI.  Musculoskeletal: Negative for back pain or muscle aches.  Neurological: Negative for dizziness, headaches or paresthesias.    Physical Exam: BP 124/70   Pulse (!) 53   Ht 6\' 3"  (1.905 m)   Wt 203 lb (92.1 kg)   BMI 25.37 kg/m  General: in no acute distress. Head: Normocephalic and atraumatic. Eyes: No scleral icterus. Conjunctiva pink . Ears: Normal auditory acuity. Mouth: Dentition intact. No ulcers or lesions.  Lungs: Clear throughout to auscultation. Heart: Regular rate and rhythm, no murmur. Abdomen: Soft, nontender and nondistended. No masses or hepatomegaly. Normal bowel sounds x 4 quadrants.  Rectal: *** Musculoskeletal: Symmetrical with no gross deformities. Extremities: No edema. Neurological: Alert oriented x 4. No focal deficits.  Psychological: Alert and cooperative. Normal mood and affect  Assessment and Recommendations:  will order elastography 01/2023.   Colonoscopy

## 2022-12-04 NOTE — Telephone Encounter (Signed)
Labs ordered, need to update PA once received

## 2022-12-05 NOTE — Progress Notes (Signed)
Agree with the assessment and plan as outlined by Colleen Kennedy-Smith, NP.    Cambrea Kirt E. Vernal Rutan, MD [facility]  

## 2022-12-06 LAB — HEPATITIS B DNA, ULTRAQUANTITATIVE, PCR
Hepatitis B DNA: 5280 [IU]/mL — ABNORMAL HIGH
Hepatitis B virus DNA: 3.72 {Log_IU}/mL — ABNORMAL HIGH

## 2022-12-07 ENCOUNTER — Other Ambulatory Visit (HOSPITAL_COMMUNITY): Payer: Self-pay

## 2022-12-07 ENCOUNTER — Telehealth: Payer: Self-pay

## 2022-12-07 NOTE — Telephone Encounter (Signed)
Pharmacy Patient Advocate Encounter   Received notification from CoverMyMeds that prior authorization for REPATHA is required/requested.   Insurance verification completed.   The patient is insured through [facility] .   Per test claim: PA required; PA submitted to above mentioned insurance via CoverMyMeds Key/confirmation #/EOC [id number] Status is pending

## 2022-12-10 ENCOUNTER — Other Ambulatory Visit: Payer: Self-pay

## 2022-12-10 DIAGNOSIS — E7849 Other hyperlipidemia: Secondary | ICD-10-CM

## 2022-12-10 LAB — LIPID PANEL
Chol/HDL Ratio: 2.8 ratio (ref 0.0–5.0)
Cholesterol, Total: 124 mg/dL (ref 100–199)
HDL: 44 mg/dL (ref >39)
LDL Chol Calc (NIH): 68 mg/dL (ref 0–99)
Triglycerides: 51 mg/dL (ref 0–149)
VLDL Cholesterol Cal: 12 mg/dL (ref 5–40)

## 2022-12-10 NOTE — Telephone Encounter (Signed)
Pharmacy Patient Advocate Encounter  Received notification from [facility] that Prior Authorization for REPATHA has been DENIED.  Full denial letter will be uploaded to the media tab. See denial reason below. NEED TO SHOW POSITIVE RESPONSE TO THERAPY

## 2022-12-10 NOTE — Progress Notes (Unsigned)
  Cardiology Office Note   Date:  12/12/2022  ID:  Thomas [name], DOB 06-11-1965, MRN [id number] PCP:  Fleet Contras, MD [facility] Cardiologist: Parke Poisson, MD  Reason for visit: 17-month follow-up  History of Present Illness    Thomas [name] is a 57 y.o. male with a hx of hypertension, family history of heart disease, hyperlipidemia, stroke in 2017, history of tobacco use, bipolar disorder, CKD.  Hx of compliance issues which were due to memory issues brought about by CVA/TIA.   Patient last saw Dr. Jacques Navy and February 2024, stay compliance of medications.  He was active playing drums for discharge and walking often.  No cardiac symptoms.  No change in therapy.  Today, patient continues to do well.  He states compliance with his medications other than he has been out of Repatha X 1-2 months.  Patient denies chest pain, shortness of breath, palpitations, PND, orthopnea, lower extremity edema, lightheadedness, syncope and bleeding issues.  He states he is seeing his nephrologist today for follow-up.  He states no permanent deficit poststroke.  It has been 8 years since he quit smoking cigarettes.  He continues to workout and is active with his church.   Objective / Physical Exam   EKG today: Sinus bradycardia, heart rate 53, PAC, left axis deviation.  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 -Refill medications today including amlodipine 10 mg daily, carvedilol 12.5 mg twice daily, chlorthalidone 25 mg daily, hydralazine 100 mg 3 times a day and spironolactone 25 mg  daily. -Patient creatinine is stable at baseline of ~2.0, potassium in normal limits.  He is followed by nephrology. -Goal BP is <130/80.  Recommend DASH diet (high in vegetables, fruits, low-fat dairy products, whole grains, poultry, fish, and nuts and low in sweets, sugar-sweetened beverages, and red meats), salt restriction and increase physical activity.  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  History of stroke -Hx of compliance issues which were due to memory issues brought about by CVA/TIA.  Now compliant with therapy; no permanent deficits -Continue aspirin and Plavix and statin therapy.  Hyperlipidemia with goal LDL less than 70 -LDL 68 in November 2024. -Refilled Lipitor 80 mg daily and Repatha 140 mg every 2 weeks. -Recommend cholesterol lowering diets - Mediterranean diet, DASH diet, vegetarian diet, low-carbohydrate diet and avoidance of trans fats.  Discussed healthier choice substitutes.  Nuts, high-fiber foods, and fiber supplements may also improve lipids.    Disposition - Follow-up in 6 months.  Order repeat 2D echo at next appt to follow aortic dilation.   Signed, Cannon Kettle, PA-C  12/12/2022 [facility]

## 2022-12-12 ENCOUNTER — Encounter: Payer: Self-pay | Admitting: Physician Assistant

## 2022-12-12 ENCOUNTER — Ambulatory Visit: Payer: Medicaid Other | Attending: Physician Assistant | Admitting: Physician Assistant

## 2022-12-12 VITALS — BP 134/78 | HR 59 | Ht 75.0 in | Wt 203.0 lb

## 2022-12-12 DIAGNOSIS — I1A0 Resistant hypertension: Secondary | ICD-10-CM

## 2022-12-12 DIAGNOSIS — Z8673 Personal history of transient ischemic attack (TIA), and cerebral infarction without residual deficits: Secondary | ICD-10-CM | POA: Diagnosis not present

## 2022-12-12 DIAGNOSIS — E785 Hyperlipidemia, unspecified: Secondary | ICD-10-CM

## 2022-12-12 DIAGNOSIS — I639 Cerebral infarction, unspecified: Secondary | ICD-10-CM

## 2022-12-12 DIAGNOSIS — E7849 Other hyperlipidemia: Secondary | ICD-10-CM | POA: Diagnosis not present

## 2022-12-12 MED ORDER — SPIRONOLACTONE 25 MG PO TABS: 25.0000 mg | ORAL_TABLET | Freq: Every day | ORAL | 3 refills | Status: DC

## 2022-12-12 MED ORDER — CARVEDILOL 12.5 MG PO TABS: ORAL_TABLET | ORAL | 1 refills | Status: DC

## 2022-12-12 MED ORDER — CLOPIDOGREL BISULFATE 75 MG PO TABS: 75.0000 mg | ORAL_TABLET | Freq: Every morning | ORAL | 1 refills | Status: DC

## 2022-12-12 MED ORDER — ATORVASTATIN CALCIUM 80 MG PO TABS: 80.0000 mg | ORAL_TABLET | Freq: Every day | ORAL | 1 refills | Status: DC

## 2022-12-12 MED ORDER — HYDRALAZINE HCL 100 MG PO TABS: 100.0000 mg | ORAL_TABLET | Freq: Three times a day (TID) | ORAL | 1 refills | Status: DC

## 2022-12-12 MED ORDER — REPATHA SURECLICK 140 MG/ML ~~LOC~~ SOAJ: 140.0000 mg | SUBCUTANEOUS | 3 refills | Status: DC

## 2022-12-12 MED ORDER — AMLODIPINE BESYLATE 10 MG PO TABS: 10.0000 mg | ORAL_TABLET | Freq: Every day | ORAL | 3 refills | Status: DC

## 2022-12-12 NOTE — Patient Instructions (Signed)
Medication Instructions:  No Changes *If you need a refill on your cardiac medications before your next appointment, please call your pharmacy*   Lab Work: No Labs If you have labs (blood work) drawn today and your tests are completely normal, you will receive your results only by: MyChart Message (if you have MyChart) OR A paper copy in the mail If you have any lab test that is abnormal or we need to change your treatment, we will call you to review the results.   Testing/Procedures: No Testing   Follow-Up: At [facility], you and your health needs are our priority.  As part of our continuing mission to provide you with exceptional heart care, we have created designated Provider Care Teams.  These Care Teams include your primary Cardiologist (physician) and Advanced Practice Providers (APPs -  Physician Assistants and Nurse Practitioners) who all work together to provide you with the care you need, when you need it.  We recommend signing up for the patient portal called "MyChart".  Sign up information is provided on this After Visit Summary.  MyChart is used to connect with patients for Virtual Visits (Telemedicine).  Patients are able to view lab/test results, encounter notes, upcoming appointments, etc.  Non-urgent messages can be sent to your provider as well.   To learn more about what you can do with MyChart, go to [email or web address].    Your next appointment:   6 month(s)  Provider:   Gayatri A Acharya, MD    

## 2022-12-13 ENCOUNTER — Other Ambulatory Visit (HOSPITAL_COMMUNITY): Payer: Self-pay

## 2022-12-16 NOTE — Telephone Encounter (Signed)
Labs done 11/18 show LDL at 68, can you please send appeal?

## 2022-12-17 ENCOUNTER — Telehealth: Payer: Self-pay

## 2022-12-17 NOTE — Telephone Encounter (Signed)
Pharmacy Patient Advocate Encounter   Received notification from Physician's Office that prior authorization for REPATHA is required/requested.   Insurance verification completed.   The patient is insured through [facility] .   Per test claim: PA required; PA submitted to above mentioned insurance via CoverMyMeds Key/confirmation #/EOC [facility] Status is pending

## 2022-12-17 NOTE — Telephone Encounter (Signed)
PA request has been Submitted. New Encounter created for follow up. For additional info see Pharmacy Prior Auth telephone encounter from 12/17/22.

## 2022-12-18 ENCOUNTER — Other Ambulatory Visit (HOSPITAL_COMMUNITY): Payer: Self-pay

## 2022-12-18 NOTE — Telephone Encounter (Signed)
Pharmacy Patient Advocate Encounter  Received notification from [facility] that Prior Authorization for REPATHA has been APPROVED from 12/18/22 to 12/18/23. Ran test claim, Copay is $4. This test claim was processed through [facility]- copay amounts may vary at other pharmacies due to pharmacy/plan contracts, or as the patient moves through the different stages of their insurance plan.

## 2023-01-01 LAB — COLOGUARD

## 2023-01-08 NOTE — Addendum Note (Signed)
Addended by: Rise Paganini on: 01/08/2023 01:54 PM   Modules accepted: Orders

## 2023-01-21 ENCOUNTER — Ambulatory Visit: Payer: Medicaid Other | Admitting: Family

## 2023-02-18 ENCOUNTER — Emergency Department (HOSPITAL_COMMUNITY)
Admission: EM | Admit: 2023-02-18 | Discharge: 2023-02-19 | Discharge status: Against Medical Advice | Payer: Medicaid Other | Attending: Emergency Medicine | Admitting: Emergency Medicine

## 2023-02-18 ENCOUNTER — Other Ambulatory Visit: Payer: Self-pay

## 2023-02-18 ENCOUNTER — Encounter (HOSPITAL_COMMUNITY): Payer: Self-pay | Admitting: Emergency Medicine

## 2023-02-18 DIAGNOSIS — Z20822 Contact with and (suspected) exposure to covid-19: Secondary | ICD-10-CM | POA: Diagnosis not present

## 2023-02-18 DIAGNOSIS — R066 Hiccough: Secondary | ICD-10-CM | POA: Diagnosis not present

## 2023-02-18 DIAGNOSIS — R509 Fever, unspecified: Secondary | ICD-10-CM | POA: Insufficient documentation

## 2023-02-18 DIAGNOSIS — Z5321 Procedure and treatment not carried out due to patient leaving prior to being seen by health care provider: Secondary | ICD-10-CM | POA: Insufficient documentation

## 2023-02-18 LAB — RESP PANEL BY RT-PCR (RSV, FLU A&B, COVID)  RVPGX2
Influenza A by PCR: POSITIVE — AB
Influenza B by PCR: NEGATIVE
Resp Syncytial Virus by PCR: NEGATIVE
SARS Coronavirus 2 by RT PCR: NEGATIVE

## 2023-02-18 MED ORDER — METOCLOPRAMIDE HCL 10 MG PO TABS
10.0000 mg | ORAL_TABLET | Freq: Once | ORAL | Status: AC
  Administered 2023-02-18: 10 mg via ORAL
  Filled 2023-02-18: qty 1

## 2023-02-18 NOTE — ED Provider Triage Note (Signed)
Emergency Medicine Provider Triage Evaluation Note  Thomas [name] , a 58 y.o. male  was evaluated in triage.  Pt complains of presents today for fever and hiccups x 1 day.  Review of Systems  Positive: Fever, cough, hiccups Negative: Congestion, chest pain, shortness of breath, nausea, vomiting, abdominal pain  Physical Exam  There were no vitals taken for this visit. Gen:   Awake, no distress   Resp:  Normal effort  MSK:   Moves extremities without difficulty  Other:  Hiccuping while in triage  Medical Decision Making  Medically screening exam initiated at 8:06 PM.  Appropriate orders placed.  Thomas [name] was informed that the remainder of the evaluation will be completed by another provider, this initial triage assessment does not replace that evaluation, and the importance of remaining in the ED until their evaluation is complete.  Labs ordered Meds ordered   Thomas [name] 02/18/23 2010

## 2023-02-18 NOTE — ED Notes (Signed)
Called pt x2 for triage, no answer.  KM

## 2023-02-18 NOTE — ED Triage Notes (Signed)
  Patient comes in with fever and hiccups that started this morning.  Patient denies any N/V.  No congestion, or runny nose.  Temp was 100.2 this morning.  Patient also states his BP has been higher than normal.  Takes 3 BP meds. Denies any pain.

## 2023-02-19 ENCOUNTER — Ambulatory Visit: Payer: Medicaid Other | Admitting: Family

## 2023-02-19 NOTE — ED Notes (Signed)
Called pt x3v for repeat vitals no answer

## 2023-03-12 ENCOUNTER — Ambulatory Visit: Payer: Medicaid Other | Admitting: Family

## 2023-03-25 ENCOUNTER — Other Ambulatory Visit: Payer: Self-pay

## 2023-03-25 DIAGNOSIS — B181 Chronic viral hepatitis B without delta-agent: Secondary | ICD-10-CM

## 2023-03-30 ENCOUNTER — Other Ambulatory Visit: Payer: Self-pay | Admitting: Family

## 2023-03-30 DIAGNOSIS — B181 Chronic viral hepatitis B without delta-agent: Secondary | ICD-10-CM

## 2023-04-09 ENCOUNTER — Ambulatory Visit (INDEPENDENT_AMBULATORY_CARE_PROVIDER_SITE_OTHER): Payer: Medicaid Other | Admitting: Family

## 2023-04-09 ENCOUNTER — Encounter: Payer: Self-pay | Admitting: Family

## 2023-04-09 ENCOUNTER — Other Ambulatory Visit: Payer: Self-pay

## 2023-04-09 VITALS — BP 135/89 | HR 50 | Temp 97.6°F | Ht 75.0 in | Wt 206.0 lb

## 2023-04-09 DIAGNOSIS — N1832 Chronic kidney disease, stage 3b: Secondary | ICD-10-CM | POA: Diagnosis not present

## 2023-04-09 DIAGNOSIS — B181 Chronic viral hepatitis B without delta-agent: Secondary | ICD-10-CM | POA: Diagnosis present

## 2023-04-09 LAB — LAB REPORT - SCANNED: EGFR: 40

## 2023-04-09 MED ORDER — ENTECAVIR 0.5 MG PO TABS: 0.5000 mg | ORAL_TABLET | Freq: Every day | ORAL | 5 refills | Status: AC

## 2023-04-09 NOTE — Assessment & Plan Note (Signed)
 Mr. Hollings has chronic kidney disease stage IIIb and is followed by nephrology.  Most recent estimated GFR was 36 and creatinine clearance calculated at 52.  No adjustments to Entecavir needed at this time.  Continue management of other comorbid conditions including hypertension.

## 2023-04-09 NOTE — Patient Instructions (Signed)
 Nice to see you. ? ?We will check your lab work today. ? ?Continue to take your medication daily as prescribed. ? ?Refills have been sent to the pharmacy. ? ?Plan for follow up in 6 months or sooner if needed with lab work on the same day. ? ?Have a great day and stay safe! ? ?

## 2023-04-09 NOTE — Progress Notes (Signed)
 Subjective:    Patient ID: Thomas [name], male    DOB: 29-Nov-1965, 58 y.o.   MRN: [id number]  Chief Complaint  Patient presents with   Follow-up   Hepatitis B   Chronic Kidney Disease    HPI:  ABDALLAH HERN is a 58 y.o. male chronic hepatitis B last seen on 10/02/2022 and started on Entecavir.  DNA level was 431 million and ALT was elevated greater than 2 times the upper limit of normal with positive hepatitis B E antibody.  Ultrasound with no focal lesions identified.  Hepatitis B DNA repeated on 12/04/2022 and improved to 5280 and ALT remained elevated at 93.  Here today for follow-up.  Mr. Caras has been doing well since his last office visit and continues to take Entecavir as prescribed with no adverse side effects or problems obtaining medication from the pharmacy.  Overall feeling well with no new concerns/complaints. denies abdominal pain, nausea, vomiting, fatigue, fever, scleral icterus or jaundice. No alcohol intake. Declined Hepatitis A vaccination.    Allergies  Allergen Reactions   Lisinopril Swelling    Facial swelling      Outpatient Medications Prior to Visit  Medication Sig Dispense Refill   amLODipine (NORVASC) 10 MG tablet Take 1 tablet (10 mg total) by mouth at bedtime. 90 tablet 3   atorvastatin (LIPITOR) 80 MG tablet Take 1 tablet (80 mg total) by mouth at bedtime. 90 tablet 1   carvedilol (COREG) 12.5 MG tablet Take 1 tablet by mouth twice daily, morning and evening 180 tablet 1   clopidogrel (PLAVIX) 75 MG tablet Take 1 tablet (75 mg total) by mouth in the morning. 90 tablet 1   Evolocumab (REPATHA SURECLICK) 140 MG/ML SOAJ Inject 140 mg into the skin every 14 (fourteen) days. 6 mL 3   hydrALAZINE (APRESOLINE) 100 MG tablet Take 1 tablet (100 mg total) by mouth 3 (three) times daily. 270 tablet 1   spironolactone (ALDACTONE) 25 MG tablet Take 1 tablet (25 mg total) by mouth daily. 90 tablet 3   Vitamin D, Ergocalciferol, (DRISDOL) 1.25 MG (50000  UNIT) CAPS capsule Take 50,000 Units by mouth once a week.     entecavir (BARACLUDE) 0.5 MG tablet Take 1 tablet (0.5 mg total) by mouth daily. 30 tablet 5   No facility-administered medications prior to visit.     Past Medical History:  Diagnosis Date   Abnormal EKG    Bipolar disorder (HCC)    Cardiomegaly    CKD (chronic kidney disease), stage II    Family history of heart disease    H/O medication noncompliance    Hyperlipidemia    Hypertension    Stroke (HCC) 11/07/2015   Tobacco abuse      Past Surgical History:  Procedure Laterality Date   ANKLE FRACTURE SURGERY     NM MYOCAR PERF WALL MOTION  06/28/2011   protocol Bruce, normal perfusion nin all regions, post stress EF 57%,, exercise cap   TRANSTHORACIC ECHOCARDIOGRAM  06/28/2011   EF=55%, Proximal septal thickening, borderline LA enlargement, boarderline aortic root dialation       Review of Systems  Constitutional:  Negative for chills, diaphoresis, fatigue and fever.  Respiratory:  Negative for cough, chest tightness, shortness of breath and wheezing.   Cardiovascular:  Negative for chest pain.  Gastrointestinal:  Negative for abdominal distention, abdominal pain, constipation, diarrhea, nausea and vomiting.  Neurological:  Negative for weakness and headaches.  Hematological:  Does not bruise/bleed easily.  Objective:    BP 135/89   Pulse (!) 50   Temp 97.6 F (36.4 C) (Oral)   Ht 6\' 3"  (1.905 m)   Wt 206 lb (93.4 kg)   SpO2 98%   BMI 25.75 kg/m  Nursing note and vital signs reviewed.  Physical Exam Constitutional:      General: He is not in acute distress.    Appearance: He is well-developed.  Cardiovascular:     Rate and Rhythm: Normal rate and regular rhythm.     Heart sounds: Normal heart sounds. No murmur heard.    No friction rub. No gallop.  Pulmonary:     Effort: Pulmonary effort is normal. No respiratory distress.     Breath sounds: Normal breath sounds. No wheezing or  rales.  Chest:     Chest wall: No tenderness.  Abdominal:     General: Bowel sounds are normal. There is no distension.     Palpations: Abdomen is soft. There is no mass.     Tenderness: There is no abdominal tenderness. There is no guarding or rebound.  Skin:    General: Skin is warm and dry.  Neurological:     Mental Status: He is alert and oriented to person, place, and time.  Psychiatric:        Behavior: Behavior normal.        Thought Content: Thought content normal.        Judgment: Judgment normal.         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 and without coma (HCC) 10/02/2022   Cerebral thrombosis with cerebral infarction 06/07/2018   Accelerated hypertension 06/06/2018   Stroke (cerebrum) (HCC) 08/29/2017   Stroke (HCC) 08/29/2017   Chronic kidney disease, stage 3b (HCC)    Bipolar disorder (HCC)    Altered mental status 02/24/2016   Urinary tract infection without hematuria 02/24/2016   Acute encephalopathy 02/24/2016   Chest pain 02/24/2016   History of stroke    Blurry vision, right eye 11/08/2015   Acute cerebrovascular accident (CVA) (HCC) 11/08/2015   Acute kidney injury superimposed on CKD (HCC) 11/07/2015   Hyperlipidemia 01/26/2014   TOBACCO ABUSE 04/19/2008   HYPERTENSION, BENIGN ESSENTIAL 01/22/2005     Problem List Items Addressed This Visit       Digestive   Chronic viral hepatitis B without delta agent and without coma (HCC) - Primary   Mr. Ausborn has good adherence and tolerance to Entecavir.  Reviewed previous lab work and discussed plan of care.  Check blood work including AFP tumor marker and will obtain ultrasound for HCC screening.  Reviewed signs/symptoms of hepatitis B flare and importance of taking medications without  stopping abruptly.  Continue current dose of Entecavir.  Plan for follow-up in 6 months or sooner if needed with lab work on the same day.      Relevant Medications   entecavir (BARACLUDE) 0.5 MG tablet   Other Relevant Orders   Hepatitis B DNA, ultraquantitative, PCR   AFP tumor marker   Hepatitis B core antibody, IgM   Hepatitis B core antibody, total   Hepatitis B surface antigen   Hepatitis B surface antibody,quantitative   BASIC METABOLIC PANEL WITH GFR  US ABDOMEN COMPLETE W/ELASTOGRAPHY     Genitourinary   Chronic kidney disease, stage 3b [facility])   Mr. Coccia has chronic kidney disease stage IIIb and is followed by nephrology.  Most recent estimated GFR was 36 and creatinine clearance calculated at 52.  No adjustments to Entecavir needed at this time.  Continue management of other comorbid conditions including hypertension.        I am having Viviann Spare L. Burnsed maintain his Vitamin D (Ergocalciferol), amLODipine, atorvastatin, carvedilol, clopidogrel, Repatha SureClick, hydrALAZINE, spironolactone, and entecavir.   Meds ordered this encounter  Medications   entecavir (BARACLUDE) 0.5 MG tablet    Sig: Take 1 tablet (0.5 mg total) by mouth daily.    Dispense:  30 tablet    Refill:  5    Supervising Provider:   Judyann Munson [[id number]]     Follow-up: Return in about 6 months (around 10/10/2023). or sooner if needed.   Marcos Eke, MSN, FNP-C Nurse Practitioner [facility] Main number: [phone]

## 2023-04-09 NOTE — Assessment & Plan Note (Signed)
 Mr. Uecker has good adherence and tolerance to Entecavir.  Reviewed previous lab work and discussed plan of care.  Check blood work including AFP tumor marker and will obtain ultrasound for HCC screening.  Reviewed signs/symptoms of hepatitis B flare and importance of taking medications without stopping abruptly.  Continue current dose of Entecavir.  Plan for follow-up in 6 months or sooner if needed with lab work on the same day.

## 2023-04-12 LAB — AFP TUMOR MARKER: AFP-Tumor Marker: 1.5 ng/mL (ref <6.1)

## 2023-04-12 LAB — HEPATITIS B SURFACE ANTIBODY, QUANTITATIVE: Hep B S AB Quant (Post): 10 m[IU]/mL (ref >=10)

## 2023-04-12 LAB — HEPATITIS B DNA, ULTRAQUANTITATIVE, PCR
Hepatitis B DNA: 11 [IU]/mL — ABNORMAL HIGH
Hepatitis B virus DNA: 1.03 {Log_IU}/mL — ABNORMAL HIGH

## 2023-04-12 LAB — BASIC METABOLIC PANEL WITH GFR
BUN/Creatinine Ratio: 9 (calc) (ref 6–22)
BUN: 17 mg/dL (ref 7–25)
CO2: 28 mmol/L (ref 20–32)
Calcium: 9.7 mg/dL (ref 8.6–10.3)
Chloride: 105 mmol/L (ref 98–110)
Creat: 1.96 mg/dL — ABNORMAL HIGH (ref 0.70–1.30)
Glucose, Bld: 88 mg/dL (ref 65–99)
Potassium: 4.3 mmol/L (ref 3.5–5.3)
Sodium: 140 mmol/L (ref 135–146)

## 2023-04-12 LAB — HEPATITIS B SURFACE ANTIGEN: Hepatitis B Surface Ag: REACTIVE — AB

## 2023-04-12 LAB — HEPATITIS B CORE ANTIBODY, IGM: Hep B C IgM: NONREACTIVE

## 2023-04-12 LAB — HEPATITIS B CORE ANTIBODY, TOTAL: Hep B Core Total Ab: REACTIVE — AB

## 2023-04-15 ENCOUNTER — Encounter: Payer: Self-pay | Admitting: Nephrology

## 2023-04-16 ENCOUNTER — Ambulatory Visit (HOSPITAL_COMMUNITY)

## 2023-04-19 ENCOUNTER — Ambulatory Visit (HOSPITAL_COMMUNITY)

## 2023-04-23 NOTE — Addendum Note (Signed)
 Addended by: Jeanine Luz D on: 04/23/2023 12:23 PM   Modules accepted: Orders

## 2023-04-26 ENCOUNTER — Ambulatory Visit (HOSPITAL_COMMUNITY)
Admission: RE | Admit: 2023-04-26 | Discharge: 2023-04-26 | Disposition: A | Discharge status: Home / Self Care | Source: Ambulatory Visit | Attending: Family | Admitting: Family

## 2023-04-26 ENCOUNTER — Ambulatory Visit (HOSPITAL_COMMUNITY)

## 2023-04-26 DIAGNOSIS — N1832 Chronic kidney disease, stage 3b: Secondary | ICD-10-CM | POA: Diagnosis present

## 2023-04-26 DIAGNOSIS — B181 Chronic viral hepatitis B without delta-agent: Secondary | ICD-10-CM | POA: Insufficient documentation

## 2023-04-29 ENCOUNTER — Telehealth (HOSPITAL_COMMUNITY): Payer: Self-pay

## 2023-04-29 NOTE — Telephone Encounter (Signed)
 pT/ pharmacy IS REQUESTING FOR A REFILL OF pRAZOSIN  to be sent to pharmacy.

## 2023-05-02 NOTE — Telephone Encounter (Signed)
 Understood not sure why, your name was written on top of the paper that's why I sent it too you, if it faxes again I'll save it so you can see.    Thanks,    JNL

## 2023-10-01 ENCOUNTER — Ambulatory Visit: Admitting: Family

## 2023-10-17 LAB — LAB REPORT - SCANNED: PTH, Intact: 28

## 2024-01-03 ENCOUNTER — Other Ambulatory Visit: Payer: Self-pay | Admitting: Pharmacist

## 2024-01-03 ENCOUNTER — Encounter: Payer: Self-pay | Admitting: Pharmacist

## 2024-01-03 DIAGNOSIS — E7849 Other hyperlipidemia: Secondary | ICD-10-CM

## 2024-01-03 DIAGNOSIS — I639 Cerebral infarction, unspecified: Secondary | ICD-10-CM

## 2024-01-03 MED ORDER — REPATHA SURECLICK 140 MG/ML ~~LOC~~ SOAJ: 140.0000 mg | SUBCUTANEOUS | 0 refills | Status: AC

## 2024-01-08 LAB — COLOGUARD: COLOGUARD: NEGATIVE

## 2024-01-21 ENCOUNTER — Telehealth: Payer: Self-pay | Admitting: Internal Medicine

## 2024-01-21 DIAGNOSIS — I639 Cerebral infarction, unspecified: Secondary | ICD-10-CM

## 2024-01-21 DIAGNOSIS — E7849 Other hyperlipidemia: Secondary | ICD-10-CM

## 2024-01-21 NOTE — Telephone Encounter (Signed)
" °*  STAT* If patient is at the pharmacy, call can be transferred to refill team.   1. Which medications need to be refilled? (please list name of each medication and dose if known) Evolocumab  (REPATHA  SURECLICK) 140 MG/ML SOAJ  atorvastatin  (LIPITOR) 80 MG tablet  amLODipine  (NORVASC ) 10 MG tablet  carvedilol  (COREG ) 12.5 MG tablet  clopidogrel  (PLAVIX ) 75 MG tablet  hydrALAZINE  (APRESOLINE ) 100 MG tablet  spironolactone  (ALDACTONE ) 25 MG tablet   2. Would you like to learn more about the convenience, safety, & potential cost savings by using the [facility]?    3. Are you open to using the [facility] (Type [facility].  ).   4. Which pharmacy/location (including street and city if local pharmacy) is medication to be sent to? [facility] - [place], [place] - [address]    5. Do they need a 30 day or 90 day supply? 90 day  "

## 2024-01-22 ENCOUNTER — Other Ambulatory Visit: Payer: Self-pay | Admitting: Internal Medicine

## 2024-01-22 MED ORDER — CARVEDILOL 12.5 MG PO TABS: ORAL_TABLET | ORAL | 0 refills | Status: AC

## 2024-01-22 MED ORDER — AMLODIPINE BESYLATE 10 MG PO TABS: 10.0000 mg | ORAL_TABLET | Freq: Every day | ORAL | 0 refills | Status: AC

## 2024-01-22 MED ORDER — ATORVASTATIN CALCIUM 80 MG PO TABS: 80.0000 mg | ORAL_TABLET | Freq: Every day | ORAL | 0 refills | Status: AC

## 2024-01-22 MED ORDER — CLOPIDOGREL BISULFATE 75 MG PO TABS: 75.0000 mg | ORAL_TABLET | Freq: Every morning | ORAL | 0 refills | Status: AC

## 2024-01-22 MED ORDER — HYDRALAZINE HCL 100 MG PO TABS: 100.0000 mg | ORAL_TABLET | Freq: Three times a day (TID) | ORAL | 0 refills | Status: AC

## 2024-01-22 MED ORDER — SPIRONOLACTONE 25 MG PO TABS: 25.0000 mg | ORAL_TABLET | Freq: Every day | ORAL | 0 refills | Status: AC

## 2024-01-22 NOTE — Telephone Encounter (Signed)
Refill request for Repatha 

## 2024-01-22 NOTE — Addendum Note (Signed)
 Addended by: DARRELL BRUCKNER on: 01/22/2024 12:31 PM   Modules accepted: Orders

## 2024-01-22 NOTE — Telephone Encounter (Signed)
 Requested Prescriptions   Signed Prescriptions Disp Refills   amLODipine  (NORVASC ) 10 MG tablet 90 tablet 0    Sig: Take 1 tablet (10 mg total) by mouth at bedtime.    Authorizing Provider: ACHARYA, GAYATRI A    Ordering User: Joscelin Fray  C   atorvastatin  (LIPITOR) 80 MG tablet 90 tablet 0    Sig: Take 1 tablet (80 mg total) by mouth at bedtime.    Authorizing Provider: ACHARYA, GAYATRI A    Ordering User: Maylie Ashton  C   carvedilol  (COREG ) 12.5 MG tablet 180 tablet 0    Sig: Take 1 tablet by mouth twice daily, morning and evening    Authorizing Provider: ACHARYA, GAYATRI A    Ordering User: Eyanna Mcgonagle  C   clopidogrel  (PLAVIX ) 75 MG tablet 90 tablet 0    Sig: Take 1 tablet (75 mg total) by mouth in the morning.    Authorizing Provider: ACHARYA, GAYATRI A    Ordering User: Anaston Koehn  C   hydrALAZINE  (APRESOLINE ) 100 MG tablet 270 tablet 0    Sig: Take 1 tablet (100 mg total) by mouth 3 (three) times daily.    Authorizing Provider: ACHARYA, GAYATRI A    Ordering User: Dantrell Schertzer  C   spironolactone  (ALDACTONE ) 25 MG tablet 90 tablet 0    Sig: Take 1 tablet (25 mg total) by mouth daily.    Authorizing Provider: ACHARYA, GAYATRI A    Ordering User: [name], [name]  C

## 2024-01-24 ENCOUNTER — Telehealth: Payer: Self-pay | Admitting: Pharmacy Technician

## 2024-01-24 NOTE — Telephone Encounter (Signed)
 Pharmacy Patient Advocate Encounter  Received notification from [facility] MEDICAID that Prior Authorization for repatha  has been APPROVED from 01/24/24 to 01/23/25   PA #/Case ID/Reference #: [id number]

## 2024-01-24 NOTE — Telephone Encounter (Signed)
" ° ° °  Pharmacy Patient Advocate Encounter   Received notification from Onbase that prior authorization for REPATHA  is required/requested.   Insurance verification completed.   The patient is insured through [facility] MEDICAID.   Per test claim: PA required; PA submitted to above mentioned insurance via Latent Key/confirmation #/EOC [id number] Status is pending  "

## 2024-01-29 ENCOUNTER — Encounter (HOSPITAL_COMMUNITY): Payer: Self-pay | Admitting: Emergency Medicine

## 2024-01-29 ENCOUNTER — Ambulatory Visit (HOSPITAL_COMMUNITY): Admission: EM | Admit: 2024-01-29 | Discharge: 2024-01-29 | Disposition: A | Discharge status: Home / Self Care

## 2024-01-29 DIAGNOSIS — K047 Periapical abscess without sinus: Secondary | ICD-10-CM

## 2024-01-29 DIAGNOSIS — K0889 Other specified disorders of teeth and supporting structures: Secondary | ICD-10-CM | POA: Diagnosis not present

## 2024-01-29 MED ORDER — HYDROCODONE-ACETAMINOPHEN 5-325 MG PO TABS: 1.0000 | ORAL_TABLET | Freq: Four times a day (QID) | ORAL | 0 refills | Status: AC | PRN

## 2024-01-29 MED ORDER — AMOXICILLIN-POT CLAVULANATE 875-125 MG PO TABS: 1.0000 | ORAL_TABLET | Freq: Two times a day (BID) | ORAL | 0 refills | Status: AC

## 2024-01-29 NOTE — Discharge Instructions (Addendum)
 Symptoms and physical exam findings are most consistent with a dental infection along the right upper molar.  There is potentially a crack in the posterior molar but this would need to be evaluated by a dentist.  We will treat with antibiotics by mouth as well as medication for pain and recommend that you follow-up with a dentist in the next several weeks for further treatment.  We recommend the following: Augmentin  875 mg twice daily for 10 days.  This is an antibiotic.  Take this with food.  Hydrocodone /acetaminophen  5/325 mg 1-2 every 6 hours as needed for severe pain.  Use caution as this medication can cause drowsiness.  Avoid driving if you are taking this medication.  Do not take products containing acetaminophen  or Tylenol  while you are taking this medication. Make sure to stay hydrated by drinking plenty of water especially while you are taking antibiotics. Schedule follow-up to see a dentist for further management if needed Return to urgent care or PCP if symptoms worsen or fail to resolve.

## 2024-01-29 NOTE — ED Provider Notes (Signed)
 " [facility]    CSN: [id number] Arrival date & time: 01/29/24  1544      History   Chief Complaint Chief Complaint  Patient presents with   Dental Pain    HPI Thomas [name] is a 59 y.o. male.   59 year old male who presents to urgent care with complaints of right upper dental pain.  He reports this started last night.  The pain is in the posterior aspect of his mouth.  He reports it is in the upper jaw.  He is noting swelling and severe pain.  He cannot sleep due to this.  He denies any fevers, nausea, vomiting.  He does have a history of chronic kidney disease so he has only been taking aspirin  for the pain.   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   Accelerated hypertension 06/06/2018   Stroke (cerebrum) (HCC) 08/29/2017   Stroke (HCC) 08/29/2017   Chronic kidney disease, stage 3b (HCC)    Bipolar disorder (HCC)    Altered mental status 02/24/2016   Urinary tract infection without hematuria 02/24/2016   Acute encephalopathy 02/24/2016   Chest pain 02/24/2016   History of stroke    Blurry vision, right eye 11/08/2015   Acute cerebrovascular accident (CVA) (HCC) 11/08/2015   Acute kidney injury superimposed on CKD 11/07/2015   Hyperlipidemia 01/26/2014   TOBACCO ABUSE 04/19/2008   HYPERTENSION, BENIGN ESSENTIAL 01/22/2005    Past Surgical History:  Procedure Laterality Date   ANKLE FRACTURE SURGERY     NM MYOCAR PERF WALL MOTION  06/28/2011   protocol Bruce, normal  perfusion nin all regions, post stress EF 57%,, exercise cap   TRANSTHORACIC ECHOCARDIOGRAM  06/28/2011   EF=55%, Proximal septal thickening, borderline LA enlargement, boarderline aortic root dialation       Home Medications    Prior to Admission medications  Medication Sig Start Date End Date Taking? Authorizing Provider  amoxicillin -clavulanate (AUGMENTIN ) 875-125 MG tablet Take 1 tablet by mouth every 12 (twelve) hours for 10 days. 01/29/24 02/08/24 Yes Kaiyan Luczak A, PA-C  D3-1000 25 MCG (1000 UT) tablet Take 1,000 Units by mouth every morning. 01/22/24  Yes [provider]  HYDROcodone -acetaminophen  (NORCO/VICODIN) 5-325 MG tablet Take 1-2 tablets by mouth every 6 (six) hours as needed for severe pain (pain score 7-10). 01/29/24  Yes Aparna Vanderweele A, PA-C  amLODipine  (NORVASC ) 10 MG tablet Take 1 tablet (10 mg total) by mouth at bedtime. 01/22/24   Acharya, Gayatri A, MD  atorvastatin  (LIPITOR) 80 MG tablet Take 1 tablet (80 mg total) by mouth at bedtime. 01/22/24   Acharya, Gayatri A, MD  carvedilol  (COREG ) 12.5 MG tablet Take 1 tablet by mouth twice daily, morning and evening 01/22/24   Acharya, Gayatri A, MD  clopidogrel  (PLAVIX ) 75 MG tablet Take 1 tablet (75 mg total) by mouth in the morning. 01/22/24   Acharya, Gayatri A, MD  entecavir  (BARACLUDE ) 0.5  MG tablet Take 1 tablet (0.5 mg total) by mouth daily. 04/09/23   Calone, Gregory D, FNP  Evolocumab  (REPATHA  SURECLICK) 140 MG/ML SOAJ Inject 140 mg into the skin every 14 (fourteen) days. 01/03/24   Acharya, Gayatri A, MD  hydrALAZINE  (APRESOLINE ) 100 MG tablet Take 1 tablet (100 mg total) by mouth 3 (three) times daily. 01/22/24   Acharya, Gayatri A, MD  spironolactone  (ALDACTONE ) 25 MG tablet Take 1 tablet (25 mg total) by mouth daily. 01/22/24   Acharya, Gayatri A, MD  Vitamin D, Ergocalciferol, (DRISDOL) 1.25 MG (50000 UNIT) CAPS capsule Take 50,000 Units by mouth once a week. 09/21/22   [provider]     Family History Family History  Problem Relation Age of Onset   Hypertension Mother    Diabetes Mother    Stroke Mother    Hypertension Father    Diabetes Father    Stroke Father    Hypertension Sister    Stroke Sister    Diabetes Sister    Hyperlipidemia Maternal Grandmother    Diabetes Maternal Grandmother    Asthma Daughter    Asthma Son     Social History Social History[1]   Allergies   Lisinopril    Review of Systems Review of Systems  Constitutional:  Negative for chills and fever.  HENT:  Positive for dental problem. Negative for ear pain and sore throat.   Eyes:  Negative for pain and visual disturbance.  Respiratory:  Negative for cough and shortness of breath.   Cardiovascular:  Negative for chest pain and palpitations.  Gastrointestinal:  Negative for abdominal pain and vomiting.  Genitourinary:  Negative for dysuria and hematuria.  Musculoskeletal:  Negative for arthralgias and back pain.  Skin:  Negative for color change and rash.  Neurological:  Negative for seizures and syncope.  All other systems reviewed and are negative.    Physical Exam Triage Vital Signs ED Triage Vitals [01/29/24 1714]  Encounter Vitals Group     BP (!) 169/110     Girls Systolic BP Percentile      Girls Diastolic BP Percentile      Boys Systolic BP Percentile      Boys Diastolic BP Percentile      Pulse Rate 89     Resp 18     Temp 98.6 F (37 C)     Temp Source Oral     SpO2 97 %     Weight      Height      Head Circumference      Peak Flow      Pain Score 10     Pain Loc      Pain Education      Exclude from Growth Chart    No data found.  Updated Vital Signs BP (!) 169/110 (BP Location: Right Arm)   Pulse 89   Temp 98.6 F (37 C) (Oral)   Resp 18   SpO2 97%   Visual Acuity Right Eye Distance:   Left Eye Distance:   Bilateral Distance:    Right Eye Near:   Left Eye Near:    Bilateral Near:     Physical Exam Vitals and nursing note  reviewed.  Constitutional:      General: He is not in acute distress.    Appearance: He is well-developed.  HENT:     Head: Normocephalic and atraumatic.     Mouth/Throat:   Eyes:     Conjunctiva/sclera: Conjunctivae normal.  Cardiovascular:  Rate and Rhythm: Normal rate and regular rhythm.     Heart sounds: No murmur heard. Pulmonary:     Effort: Pulmonary effort is normal. No respiratory distress.     Breath sounds: Normal breath sounds.  Abdominal:     Palpations: Abdomen is soft.     Tenderness: There is no abdominal tenderness.  Musculoskeletal:        General: No swelling.     Cervical back: Neck supple.  Skin:    General: Skin is warm and dry.     Capillary Refill: Capillary refill takes less than 2 seconds.  Neurological:     Mental Status: He is alert.  Psychiatric:        Mood and Affect: Mood normal.      UC Treatments / Results  Labs (all labs ordered are listed, but only abnormal results are displayed) Labs Reviewed - No data to display  EKG   Radiology No results found.  Procedures Procedures (including critical care time)  Medications Ordered in UC Medications - No data to display  Initial Impression / Assessment and Plan / UC Course  I have reviewed the triage vital signs and the nursing notes.  Pertinent labs & imaging results that were available during my care of the patient were reviewed by me and considered in my medical decision making (see chart for details).     Dental infection  Pain, dental   Symptoms and physical exam findings are most consistent with a dental infection along the right upper molar.  There is potentially a crack in the posterior molar but this would need to be evaluated by a dentist.  We will treat with antibiotics by mouth as well as medication for pain and recommend that you follow-up with a dentist in the next several weeks for further treatment.  We recommend the following: Augmentin  875 mg twice daily for  10 days.  This is an antibiotic.  Take this with food.  Hydrocodone /acetaminophen  5/325 mg 1-2 every 6 hours as needed for severe pain.  Use caution as this medication can cause drowsiness.  Avoid driving if you are taking this medication.  Do not take products containing acetaminophen  or Tylenol  while you are taking this medication. Make sure to stay hydrated by drinking plenty of water especially while you are taking antibiotics. Schedule follow-up to see a dentist for further management if needed Return to urgent care or PCP if symptoms worsen or fail to resolve.   Final Clinical Impressions(s) / UC Diagnoses   Final diagnoses:  Dental infection  Pain, dental     Discharge Instructions      Symptoms and physical exam findings are most consistent with a dental infection along the right upper molar.  There is potentially a crack in the posterior molar but this would need to be evaluated by a dentist.  We will treat with antibiotics by mouth as well as medication for pain and recommend that you follow-up with a dentist in the next several weeks for further treatment.  We recommend the following: Augmentin  875 mg twice daily for 10 days.  This is an antibiotic.  Take this with food.  Hydrocodone /acetaminophen  5/325 mg 1-2 every 6 hours as needed for severe pain.  Use caution as this medication can cause drowsiness.  Avoid driving if you are taking this medication.  Do not take products containing acetaminophen  or Tylenol  while you are taking this medication. Make sure to stay hydrated by drinking plenty of water especially while  you are taking antibiotics. Schedule follow-up to see a dentist for further management if needed Return to urgent care or PCP if symptoms worsen or fail to resolve.      ED Prescriptions     Medication Sig Dispense Auth. Provider   amoxicillin -clavulanate (AUGMENTIN ) 875-125 MG tablet Take 1 tablet by mouth every 12 (twelve) hours for 10 days. 20 tablet Dezmin Kittelson,  Andee Chivers A, PA-C   HYDROcodone -acetaminophen  (NORCO/VICODIN) 5-325 MG tablet Take 1-2 tablets by mouth every 6 (six) hours as needed for severe pain (pain score 7-10). 10 tablet Teresa Almarie LABOR, [place]      I have reviewed the PDMP during this encounter.    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

## 2024-01-29 NOTE — ED Triage Notes (Signed)
 Pt reports right dental pain since last night. Hasn't tried any thing for pain.

## 2024-03-10 ENCOUNTER — Ambulatory Visit: Admitting: Internal Medicine
